# Patient Record
Sex: Female | Born: 1980 | Race: White | Hispanic: No | Marital: Married | Smoking: Current every day smoker
Health system: Southern US, Community
[De-identification: ages and names within clinical notes are randomized; demographics above are authoritative.]

## PROBLEM LIST (undated history)

## (undated) DIAGNOSIS — I10 Essential (primary) hypertension: Secondary | ICD-10-CM

## (undated) DIAGNOSIS — R569 Unspecified convulsions: Secondary | ICD-10-CM

## (undated) HISTORY — PX: TUBAL LIGATION: SHX77

## (undated) HISTORY — PX: TONSILLECTOMY: SUR1361

---

## 2013-08-08 ENCOUNTER — Ambulatory Visit: Payer: Self-pay | Admitting: Physician Assistant

## 2013-08-08 LAB — RAPID STREP-A WITH REFLX: Micro Text Report: NEGATIVE

## 2013-08-11 LAB — BETA STREP CULTURE(ARMC)

## 2015-10-15 ENCOUNTER — Ambulatory Visit
Admission: EM | Admit: 2015-10-15 | Discharge: 2015-10-15 | Disposition: A | Discharge status: Home / Self Care | Payer: PRIVATE HEALTH INSURANCE | Attending: Family Medicine | Admitting: Family Medicine

## 2015-10-15 DIAGNOSIS — B349 Viral infection, unspecified: Secondary | ICD-10-CM | POA: Diagnosis not present

## 2015-10-15 DIAGNOSIS — J988 Other specified respiratory disorders: Principal | ICD-10-CM

## 2015-10-15 DIAGNOSIS — B9789 Other viral agents as the cause of diseases classified elsewhere: Secondary | ICD-10-CM

## 2015-10-15 LAB — RAPID INFLUENZA A&B ANTIGENS
Influenza A (ARMC): NEGATIVE
Influenza B (ARMC): NEGATIVE

## 2015-10-15 NOTE — ED Provider Notes (Signed)
[facility]  ____________________________________________  Time seen: Approximately 9:13 AM  I have reviewed the triage vital signs and the nursing notes.   HISTORY  Chief Complaint Nasal Congestion and Cough   HPI Gabriela White is a 35 y.o. female presents for the complaints of cough and nasal congestion since this past Wednesday. Reports 4-5 days of overall symptoms gradual onset. Patient states that she actually feels like she is getting better but states that she was bringing her son in today and while she was here, she wanted to go ahead and be evaluated.  Patient states that nasal congestion and cough have been improving. When asked what is her biggest complaints at this time, patient states that she has no complaints at this time as she is feeling better. Reports has been taking over-the-counter cough and congestion medications which have helped. Reports yesterday she had a fever of 101 orally. States that was the first time she had any fever. Denies sore throat.  Denies chest pain, shortness of breath, abdominal pain, dysuria, neck pain, back pain, rash, chest pain with deep breath, weakness or dizziness. Reports has continued to eat and drink well.   Last menstrual: Now. Denies chance of pregnancy.   History reviewed. No pertinent past medical history.  There are no active problems to display for this patient.   History reviewed. No pertinent past surgical history.  No current outpatient prescriptions on file.  Allergies Review of patient's allergies indicates no known allergies.  History reviewed. No pertinent family history.  Social History Social History  Substance Use Topics  . Smoking status: Never Smoker   . Smokeless tobacco: None  . Alcohol Use: No    Review of Systems Constitutional: Fever as above. Eyes: No visual changes. ENT: No sore throat. Positive nasal congestion and cough. Cardiovascular: Denies chest pain. Respiratory: Denies  shortness of breath. Gastrointestinal: No abdominal pain.  No nausea, no vomiting.  No diarrhea.  No constipation. Genitourinary: Negative for dysuria. Musculoskeletal: Negative for back pain. Skin: Negative for rash. Neurological: Negative for headaches, focal weakness or numbness.  10-point ROS otherwise negative.  ____________________________________________   PHYSICAL EXAM:  VITAL SIGNS: ED Triage Vitals  Enc Vitals Group     BP 10/15/15 0853 141/96 mmHg     Pulse Rate 10/15/15 0853 111 Recheck 94     Resp 10/15/15 0853 20     Temp 10/15/15 0853 98.4 F (36.9 C)     Temp Source 10/15/15 0853 Oral     SpO2 10/15/15 0853 100 %     Weight 10/15/15 0853 160 lb (72.576 kg)     Height 10/15/15 0853 5\' 3"  (1.6 m)     Head Cir --      Peak Flow --      Pain Score 10/15/15 0855 3     Pain Loc --      Pain Edu? --      Excl. in GC? --   Constitutional: Alert and oriented. Well appearing and in no acute distress. Eyes: Conjunctivae are normal. PERRL. EOMI. Head: Atraumatic. No sinus tenderness to palpation. No swelling. No erythema.  Ears: no erythema, normal TMs bilaterally.   Nose:Nasal congestion with clear rhinorrhea  Mouth/Throat: Mucous membranes are moist. Mild pharyngeal erythema. No tonsillar swelling or exudate.  Neck: No stridor.  No cervical spine tenderness to palpation. Hematological/Lymphatic/Immunilogical: No cervical lymphadenopathy. Cardiovascular: Normal rate, regular rhythm. Grossly normal heart sounds.  Good peripheral circulation. Respiratory: Normal respiratory effort.  No retractions. Lungs  CTAB.No wheezes, rales or rhonchi. Good air movement.  Gastrointestinal: Soft and nontender. Normal Bowel sounds. No CVA tenderness. Musculoskeletal: No lower or upper extremity tenderness nor edema. No cervical, thoracic or lumbar tenderness to palpation. Neurologic:  Normal speech and language. No gross focal neurologic deficits are appreciated. No gait  instability. Skin:  Skin is warm, dry and intact. No rash noted. Psychiatric: Mood and affect are normal. Speech and behavior are normal.  ____________________________________________   LABS (all labs ordered are listed, but only abnormal results are displayed)  Labs Reviewed  RAPID INFLUENZA A&B ANTIGENS ([facility] ONLY)    INITIAL IMPRESSION / ASSESSMENT AND PLAN / ED COURSE  Pertinent labs & imaging results that were available during my care of the patient were reviewed by me and considered in my medical decision making (see chart for details).  Very well-appearing patient. No acute distress. Presents for the complaints of 4-5 days of runny nose, nasal congestion and cough. Patient states her symptoms are improving. Patient's son recently with similar. Patient states that overall this time she feels well. Denies sore throat. Lungs clear throughout. Abdomen soft and nontender. Moist mucous membranes. Will evaluate for influenza. Patient states that she does not want to be evaluated for strep throat as her throat does not hurt. Suspect viral upper respiratory infection.  Influenza negative. Patient denies need for prescription medications. Encourage rest, fluids, over the counter tylenol or ibuprofen as needed. Work note for today given. Discussed follow up with Primary care physician this week. Discussed follow up and return parameters including no resolution or any worsening concerns. Patient verbalized understanding and agreed to plan.   ____________________________________________   FINAL CLINICAL IMPRESSION(S) / ED DIAGNOSES  Final diagnoses:  Viral respiratory illness      Note: This dictation was prepared with Dragon dictation along with smaller phrase technology. Any transcriptional errors that result from this process are unintentional.    Renford Dills, NP 10/15/15 1010

## 2015-10-15 NOTE — ED Notes (Signed)
Patient c/o nasal congestion and cough which started this past Wednesday.

## 2015-10-15 NOTE — Discharge Instructions (Signed)
Take over the counter tylenol or ibuprofen as needed. Rest. Drink plenty of fluids.  ° °Follow up with your primary care physician this week as needed. Return to Urgent care for new or worsening concerns.  ° ° °Viral Infections °A viral infection can be caused by different types of viruses. Most viral infections are not serious and resolve on their own. However, some infections may cause severe symptoms and may lead to further complications. °SYMPTOMS °Viruses can frequently cause: °· Minor sore throat. °· Aches and pains. °· Headaches. °· Runny nose. °· Different types of rashes. °· Watery eyes. °· Tiredness. °· Cough. °· Loss of appetite. °· Gastrointestinal infections, resulting in nausea, vomiting, and diarrhea. °These symptoms do not respond to antibiotics because the infection is not caused by bacteria. However, you might catch a bacterial infection following the viral infection. This is sometimes called a "superinfection." Symptoms of such a bacterial infection may include: °· Worsening sore throat with pus and difficulty swallowing. °· Swollen neck glands. °· Chills and a high or persistent fever. °· Severe headache. °· Tenderness over the sinuses. °· Persistent overall ill feeling (malaise), muscle aches, and tiredness (fatigue). °· Persistent cough. °· Yellow, green, or brown mucus production with coughing. °HOME CARE INSTRUCTIONS  °· Only take over-the-counter or prescription medicines for pain, discomfort, diarrhea, or fever as directed by your caregiver. °· Drink enough water and fluids to keep your urine clear or pale yellow. Sports drinks can provide valuable electrolytes, sugars, and hydration. °· Get plenty of rest and maintain proper nutrition. Soups and broths with crackers or rice are fine. °SEEK IMMEDIATE MEDICAL CARE IF:  °· You have severe headaches, shortness of breath, chest pain, neck pain, or an unusual rash. °· You have uncontrolled vomiting, diarrhea, or you are unable to keep down  fluids. °· You or your child has an oral temperature above 102° F (38.9° C), not controlled by medicine. °· Your baby is older than 3 months with a rectal temperature of 102° F (38.9° C) or higher. °· Your baby is 3 months old or younger with a rectal temperature of 100.4° F (38° C) or higher. °MAKE SURE YOU:  °· Understand these instructions. °· Will watch your condition. °· Will get help right away if you are not doing well or get worse. °  °This information is not intended to replace advice given to you by your health care provider. Make sure you discuss any questions you have with your health care provider. °  °Document Released: 05/07/2005 Document Revised: 10/20/2011 Document Reviewed: 01/03/2015 °Elsevier Interactive Patient Education ©2016 [employer] ° °

## 2017-05-18 ENCOUNTER — Encounter: Payer: Self-pay | Admitting: *Deleted

## 2017-05-18 ENCOUNTER — Ambulatory Visit
Admission: EM | Admit: 2017-05-18 | Discharge: 2017-05-18 | Disposition: A | Discharge status: Home / Self Care | Payer: PRIVATE HEALTH INSURANCE | Attending: Family Medicine | Admitting: Family Medicine

## 2017-05-18 DIAGNOSIS — J0100 Acute maxillary sinusitis, unspecified: Secondary | ICD-10-CM | POA: Diagnosis not present

## 2017-05-18 MED ORDER — AMOXICILLIN-POT CLAVULANATE 875-125 MG PO TABS: 1.0000 | ORAL_TABLET | Freq: Two times a day (BID) | ORAL | 0 refills | Status: DC

## 2017-05-18 NOTE — Discharge Instructions (Signed)
Use the Netty pot daily. 10 minutes later use Nasacort or Flonase in both nostrils for the next 3-4 weeks. Follow up with your primary care or ENT if you continue to have symptoms

## 2017-05-18 NOTE — ED Triage Notes (Signed)
Headache, facial pain, ear pain, onset yesterday. Hx of sinus infections.

## 2017-05-18 NOTE — ED Provider Notes (Signed)
[facility]    CSN: [id number] Arrival date & time: 05/18/17  0272     History   Chief Complaint Chief Complaint  Patient presents with  . Headache  . Otalgia  . Facial Pain    HPI Gabriela White is a 36 y.o. female.   HPI  This is a 36 year old female with a previous history of recurrent sinus infections who started having symptoms of a cold approximately 10 days ago. It seemed to linger and then yesterday she began to feel a headache from her nose on up to the top of her head along with facial pain ear pain nasal drainage of clear to yellow mucus and a mild cough early in the morning and late at night. She denies any fever or chills. She was trying to see her ENT today but was unable to get in and decided to come here.              History reviewed. No pertinent past medical history.  There are no active problems to display for this patient.   History reviewed. No pertinent surgical history.  OB History    No data available       Home Medications    Prior to Admission medications   Medication Sig Start Date End Date Taking? Authorizing Provider  amoxicillin-clavulanate (AUGMENTIN) 875-125 MG tablet Take 1 tablet by mouth every 12 (twelve) hours. 05/18/17   Lutricia Feil, PA-C    Family History History reviewed. No pertinent family history.  Social History Social History  Substance Use Topics  . Smoking status: Never Smoker  . Smokeless tobacco: Never Used  . Alcohol use No     Allergies   Patient has no known allergies.   Review of Systems Review of Systems  Constitutional: Positive for activity change. Negative for chills, diaphoresis, fatigue and fever.  HENT: Positive for congestion, ear pain, postnasal drip, sinus pain and sinus pressure.   Respiratory: Positive for cough.   All other systems reviewed and are negative.    Physical Exam Triage Vital Signs ED Triage Vitals  Enc Vitals Group     BP 05/18/17 0846  (!) 160/95     Pulse Rate 05/18/17 0846 (!) 106     Resp 05/18/17 0846 16     Temp 05/18/17 0846 98.2 F (36.8 C)     Temp Source 05/18/17 0846 Oral     SpO2 05/18/17 0846 100 %     Weight 05/18/17 0848 150 lb (68 kg)     Height 05/18/17 0848  (1.6 m)     Head Circumference --      Peak Flow --      Pain Score 05/18/17 0849 8     Pain Loc --      Pain Edu? --      Excl. in GC? --    No data found.   Updated Vital Signs BP (!) 160/95 (BP Location: Left Arm)   Pulse (!) 106   Temp 98.2 F (36.8 C) (Oral)   Resp 16   Ht  (1.6 m)   Wt 150 lb (68 kg)   SpO2 100%   BMI 26.57 kg/m   Visual Acuity Right Eye Distance:   Left Eye Distance:   Bilateral Distance:    Right Eye Near:   Left Eye Near:    Bilateral Near:     Physical Exam  Constitutional: She is oriented to person, place, and time. She appears  well-developed and well-nourished. No distress.  HENT:  Head: Normocephalic.  Right Ear: External ear normal.  Left Ear: External ear normal.  Nose: Nose normal.  Mouth/Throat: Oropharynx is clear and moist. No oropharyngeal exudate.  Eyes: Pupils are equal, round, and reactive to light. Right eye exhibits no discharge. Left eye exhibits no discharge.  Neck: Normal range of motion.  Pulmonary/Chest: Effort normal and breath sounds normal.  Musculoskeletal: Normal range of motion.  Lymphadenopathy:    She has no cervical adenopathy.  Neurological: She is alert and oriented to person, place, and time.  Skin: Skin is warm and dry. She is not diaphoretic.  Psychiatric: She has a normal mood and affect. Her behavior is normal. Judgment and thought content normal.  Nursing note and vitals reviewed.    UC Treatments / Results  Labs (all labs ordered are listed, but only abnormal results are displayed) Labs Reviewed - No data to display  EKG  EKG Interpretation None       Radiology No results found.  Procedures Procedures (including critical care  time)  Medications Ordered in UC Medications - No data to display   Initial Impression / Assessment and Plan / UC Course  I have reviewed the triage vital signs and the nursing notes.  Pertinent labs & imaging results that were available during my care of the patient were reviewed by me and considered in my medical decision making (see chart for details).     Plan: 1. Test/x-ray results and diagnosis reviewed with patient 2. rx as per orders; risks, benefits, potential side effects reviewed with patient 3. Recommend supportive treatment with use of a Nettie pot on a daily basis. Follow with nasal spray of either Nasacort or Flonase continue treatment for 3 weeks. We'll start on Augmentin since she has had this approximately 10 days at this point time with tenderness over the maxillary sinuses and a history of recurrent sinusitis in the past.If she  is not improving ,I recommend that she follow-up with her primary care physician. 4. F/u prn if symptoms worsen or don't improve   Final Clinical Impressions(s) / UC Diagnoses   Final diagnoses:  Acute maxillary sinusitis, recurrence not specified    New Prescriptions Discharge Medication List as of 05/18/2017  9:43 AM    START taking these medications   Details  amoxicillin-clavulanate (AUGMENTIN) 875-125 MG tablet Take 1 tablet by mouth every 12 (twelve) hours., Starting Mon 05/18/2017, Normal         Controlled Substance Prescriptions [place] Controlled Substance Registry consulted? Not Applicable   Lutricia Feil, PA-C 05/18/17 4098

## 2017-12-21 ENCOUNTER — Other Ambulatory Visit: Payer: Self-pay

## 2017-12-21 ENCOUNTER — Ambulatory Visit
Admission: EM | Admit: 2017-12-21 | Discharge: 2017-12-21 | Disposition: A | Discharge status: Home / Self Care | Payer: PRIVATE HEALTH INSURANCE | Attending: Family Medicine | Admitting: Family Medicine

## 2017-12-21 DIAGNOSIS — E871 Hypo-osmolality and hyponatremia: Secondary | ICD-10-CM | POA: Insufficient documentation

## 2017-12-21 DIAGNOSIS — R002 Palpitations: Secondary | ICD-10-CM | POA: Diagnosis not present

## 2017-12-21 DIAGNOSIS — E860 Dehydration: Secondary | ICD-10-CM | POA: Diagnosis not present

## 2017-12-21 LAB — CBC WITH DIFFERENTIAL/PLATELET
BASOS ABS: 0.1 10*3/uL (ref 0–0.1)
BASOS PCT: 1 %
Eosinophils Absolute: 0 10*3/uL (ref 0–0.7)
Eosinophils Relative: 0 %
HEMATOCRIT: 39 % (ref 35.0–47.0)
HEMOGLOBIN: 13.6 g/dL (ref 12.0–16.0)
Lymphocytes Relative: 14 %
Lymphs Abs: 1.6 10*3/uL (ref 1.0–3.6)
MCH: 32.5 pg (ref 26.0–34.0)
MCHC: 34.8 g/dL (ref 32.0–36.0)
MCV: 93.5 fL (ref 80.0–100.0)
Monocytes Absolute: 0.8 10*3/uL (ref 0.2–0.9)
Monocytes Relative: 7 %
NEUTROS ABS: 8.9 10*3/uL — AB (ref 1.4–6.5)
NEUTROS PCT: 78 %
Platelets: 213 10*3/uL (ref 150–440)
RBC: 4.17 MIL/uL (ref 3.80–5.20)
RDW: 13.6 % (ref 11.5–14.5)
WBC: 11.4 10*3/uL — ABNORMAL HIGH (ref 3.6–11.0)

## 2017-12-21 LAB — COMPREHENSIVE METABOLIC PANEL
ALBUMIN: 4.8 g/dL (ref 3.5–5.0)
ALK PHOS: 44 U/L (ref 38–126)
ALT: 27 U/L (ref 14–54)
ANION GAP: 13 (ref 5–15)
AST: 41 U/L (ref 15–41)
BILIRUBIN TOTAL: 0.8 mg/dL (ref 0.3–1.2)
BUN: 5 mg/dL — AB (ref 6–20)
CO2: 21 mmol/L — AB (ref 22–32)
Calcium: 10.1 mg/dL (ref 8.9–10.3)
Chloride: 97 mmol/L — ABNORMAL LOW (ref 101–111)
Creatinine, Ser: 0.5 mg/dL (ref 0.44–1.00)
GFR calc Af Amer: >60 mL/min (ref >60)
GFR calc non Af Amer: >60 mL/min (ref >60)
GLUCOSE: 107 mg/dL — AB (ref 65–99)
POTASSIUM: 4 mmol/L (ref 3.5–5.1)
Sodium: 131 mmol/L — ABNORMAL LOW (ref 135–145)
TOTAL PROTEIN: 8.2 g/dL — AB (ref 6.5–8.1)

## 2017-12-21 LAB — MAGNESIUM: Magnesium: 1.7 mg/dL (ref 1.7–2.4)

## 2017-12-21 LAB — GLUCOSE, CAPILLARY: Glucose-Capillary: 94 mg/dL (ref 65–99)

## 2017-12-21 MED ORDER — SODIUM CHLORIDE 0.9 % IV SOLN
Freq: Once | INTRAVENOUS | Status: AC
  Administered 2017-12-21: 14:00:00 via INTRAVENOUS

## 2017-12-21 NOTE — Discharge Instructions (Addendum)
Continue with  increase gatorade and water intake

## 2017-12-21 NOTE — ED Triage Notes (Addendum)
Pt with palpitations starting this a.m. And feeling shaky. Denies pain. Endorses drinking 3 liquor drinks last night

## 2017-12-21 NOTE — ED Provider Notes (Signed)
[facility]    CSN: [id number] Arrival date & time: 12/21/17  1250     History   Chief Complaint Chief Complaint  Patient presents with  . Palpitations    HPI Gabriela White is a 37 y.o. female.   The history is provided by the patient.  Palpitations  Palpitations quality:  Fast Onset quality:  Sudden Duration:  6 hours Timing:  Constant Progression:  Unchanged Chronicity:  New Context: caffeine   Context comment:  Alcohol last night (3 vodka drinks); states did not drink enough water Relieved by:  None tried Ineffective treatments:  None tried Associated symptoms: no back pain, no chest pain, no chest pressure, no cough, no diaphoresis, no dizziness, no hemoptysis, no leg pain, no lower extremity edema, no malaise/fatigue, no nausea, no near-syncope, no numbness, no orthopnea, no PND, no shortness of breath, no syncope, no vomiting and no weakness   Risk factors: no diabetes mellitus, no heart disease, no hx of atrial fibrillation, no hx of DVT, no hx of PE, no hx of thyroid disease, no hypercoagulable state, no hyperthyroidism, no OTC sinus medications and no stress     History reviewed. No pertinent past medical history.  There are no active problems to display for this patient.   History reviewed. No pertinent surgical history.  OB History   None      Home Medications    Prior to Admission medications   Medication Sig Start Date End Date Taking? Authorizing Provider  amoxicillin-clavulanate (AUGMENTIN) 875-125 MG tablet Take 1 tablet by mouth every 12 (twelve) hours. 05/18/17   Lutricia Feil, PA-C    Family History History reviewed. No pertinent family history.  Social History Social History   Tobacco Use  . Smoking status: Never Smoker  . Smokeless tobacco: Never Used  Substance Use Topics  . Alcohol use: Yes    Comment: social  . Drug use: No     Allergies   Patient has no known allergies.   Review of Systems Review  of Systems  Constitutional: Negative for diaphoresis and malaise/fatigue.  Respiratory: Negative for cough, hemoptysis and shortness of breath.   Cardiovascular: Positive for palpitations. Negative for chest pain, orthopnea, syncope, PND and near-syncope.  Gastrointestinal: Negative for nausea and vomiting.  Musculoskeletal: Negative for back pain.  Neurological: Negative for dizziness, weakness and numbness.     Physical Exam Triage Vital Signs ED Triage Vitals  Enc Vitals Group     BP 12/21/17 1301 (!) 163/101     Pulse Rate 12/21/17 1301 (!) 121     Resp 12/21/17 1301 20     Temp 12/21/17 1301 98.5 F (36.9 C)     Temp Source 12/21/17 1301 Oral     SpO2 12/21/17 1301 100 %     Weight 12/21/17 1258 150 lb (68 kg)     Height 12/21/17 1258  (1.6 m)     Head Circumference --      Peak Flow --      Pain Score 12/21/17 1258 0     Pain Loc --      Pain Edu? --      Excl. in GC? --    No data found.  Updated Vital Signs BP (!) 160/96   Pulse (!) 102   Temp 98.3 F (36.8 C) (Oral)   Resp 18   Ht  (1.6 m)   Wt 150 lb (68 kg)   LMP 11/28/2017   SpO2 100%  BMI 26.57 kg/m   Visual Acuity Right Eye Distance:   Left Eye Distance:   Bilateral Distance:    Right Eye Near:   Left Eye Near:    Bilateral Near:     Physical Exam  Constitutional: She is oriented to person, place, and time. She appears well-developed and well-nourished. No distress.  HENT:  Head: Normocephalic.  Right Ear: Tympanic membrane, external ear and ear canal normal.  Left Ear: Tympanic membrane, external ear and ear canal normal.  Nose: Nose normal.  Mouth/Throat: Oropharynx is clear and moist and mucous membranes are normal. No tonsillar exudate.  Neck: Normal range of motion. Neck supple. No JVD present. No tracheal deviation present. No thyromegaly present.  Cardiovascular: Regular rhythm, normal heart sounds and intact distal pulses. Tachycardia present.  No murmur  heard. Pulmonary/Chest: Effort normal and breath sounds normal. No stridor. No respiratory distress. She has no wheezes. She has no rales. She exhibits no tenderness.  Abdominal: Soft. Bowel sounds are normal. She exhibits no distension and no mass. There is no tenderness. There is no rebound and no guarding.  Musculoskeletal: She exhibits no edema or tenderness.  Lymphadenopathy:    She has no cervical adenopathy.  Neurological: She is alert and oriented to person, place, and time. She has normal reflexes.  Skin: Skin is warm and dry. No rash noted. She is not diaphoretic. No erythema. No pallor.  Psychiatric: She has a normal mood and affect. Her behavior is normal. Thought content normal.  Nursing note and vitals reviewed.    UC Treatments / Results  Labs (all labs ordered are listed, but only abnormal results are displayed) Labs Reviewed  CBC WITH DIFFERENTIAL/PLATELET - Abnormal; Notable for the following components:      Result Value   WBC 11.4 (*)    Neutro Abs 8.9 (*)    All other components within normal limits  COMPREHENSIVE METABOLIC PANEL - Abnormal; Notable for the following components:   Sodium 131 (*)    Chloride 97 (*)    CO2 21 (*)    Glucose, Bld 107 (*)    BUN 5 (*)    Total Protein 8.2 (*)    All other components within normal limits  MAGNESIUM  GLUCOSE, CAPILLARY  CBG MONITORING, ED    EKG None  Radiology No results found.  Procedures ED EKG Date/Time: 12/21/2017 2:28 PM Performed by: Payton Mccallum, MD Authorized by: Payton Mccallum, MD   ECG reviewed by ED Physician in the absence of a cardiologist: yes   Previous ECG:    Previous ECG:  Unavailable Interpretation:    Interpretation: abnormal   Rate:    ECG rate:  116   ECG rate assessment: tachycardic   Rhythm:    Rhythm: sinus tachycardia   Ectopy:    Ectopy: none   QRS:    QRS axis:  Normal Conduction:    Conduction: normal   ST segments:    ST segments:  Normal T waves:    T  waves: normal     (including critical care time)  Medications Ordered in UC Medications  0.9 %  sodium chloride infusion ( Intravenous Stopped 12/21/17 1407)    Initial Impression / Assessment and Plan / UC Course  I have reviewed the triage vital signs and the nursing notes.  Pertinent labs & imaging results that were available during my care of the patient were reviewed by me and considered in my medical decision making (see chart for details).  Final Clinical Impressions(s) / UC Diagnoses   Final diagnoses:  Hyponatremia  Dehydration     Discharge Instructions     Continue with  increase gatorade and water intake    ED Prescriptions    None      1. Labs/ekg results and diagnosis reviewed with patient 2. Given 1 L of NS with improvement of symptoms 3. Recommend supportive treatment with increased water and gatorade; recommend alcohol cessation  4. Follow-up prn if symptoms worsen or don't improve  Controlled Substance Prescriptions [place] Controlled Substance Registry consulted? Not Applicable   Payton Mccallum, MD 12/21/17 [phone]

## 2019-05-20 LAB — LIPID PANEL
Cholesterol: 193 (ref 0–200)
HDL: 117 — AB (ref 35–70)
LDL Cholesterol: 70
Triglycerides: 32 — AB (ref 40–160)

## 2020-08-10 ENCOUNTER — Encounter: Payer: Self-pay | Admitting: Emergency Medicine

## 2020-08-10 ENCOUNTER — Emergency Department
Admission: EM | Admit: 2020-08-10 | Discharge: 2020-08-10 | Disposition: A | Discharge status: Home / Self Care | Payer: No Typology Code available for payment source | Attending: Emergency Medicine | Admitting: Emergency Medicine

## 2020-08-10 ENCOUNTER — Other Ambulatory Visit: Payer: Self-pay

## 2020-08-10 DIAGNOSIS — R112 Nausea with vomiting, unspecified: Secondary | ICD-10-CM | POA: Insufficient documentation

## 2020-08-10 DIAGNOSIS — Z5321 Procedure and treatment not carried out due to patient leaving prior to being seen by health care provider: Secondary | ICD-10-CM | POA: Diagnosis not present

## 2020-08-10 DIAGNOSIS — T7840XA Allergy, unspecified, initial encounter: Secondary | ICD-10-CM | POA: Insufficient documentation

## 2020-08-10 HISTORY — DX: Essential (primary) hypertension: I10

## 2020-08-10 LAB — URINALYSIS, COMPLETE (UACMP) WITH MICROSCOPIC
Bacteria, UA: NONE SEEN
Bilirubin Urine: NEGATIVE
Glucose, UA: NEGATIVE mg/dL
Hgb urine dipstick: NEGATIVE
Ketones, ur: NEGATIVE mg/dL
Leukocytes,Ua: NEGATIVE
Nitrite: POSITIVE — AB
Protein, ur: 100 mg/dL — AB
Specific Gravity, Urine: 1.008 (ref 1.005–1.030)
pH: 9 — ABNORMAL HIGH (ref 5.0–8.0)

## 2020-08-10 LAB — CBC
HCT: 33 % — ABNORMAL LOW (ref 36.0–46.0)
Hemoglobin: 11.5 g/dL — ABNORMAL LOW (ref 12.0–15.0)
MCH: 32.3 pg (ref 26.0–34.0)
MCHC: 34.8 g/dL (ref 30.0–36.0)
MCV: 92.7 fL (ref 80.0–100.0)
Platelets: 144 10*3/uL — ABNORMAL LOW (ref 150–400)
RBC: 3.56 MIL/uL — ABNORMAL LOW (ref 3.87–5.11)
RDW: 12.9 % (ref 11.5–15.5)
WBC: 9.6 10*3/uL (ref 4.0–10.5)
nRBC: 0 % (ref 0.0–0.2)

## 2020-08-10 LAB — COMPREHENSIVE METABOLIC PANEL
ALT: 73 U/L — ABNORMAL HIGH (ref 0–44)
AST: 82 U/L — ABNORMAL HIGH (ref 15–41)
Albumin: 4.7 g/dL (ref 3.5–5.0)
Alkaline Phosphatase: 57 U/L (ref 38–126)
Anion gap: 12 (ref 5–15)
BUN: <5 mg/dL — ABNORMAL LOW (ref 6–20)
CO2: 26 mmol/L (ref 22–32)
Calcium: 9.4 mg/dL (ref 8.9–10.3)
Chloride: 87 mmol/L — ABNORMAL LOW (ref 98–111)
Creatinine, Ser: 0.49 mg/dL (ref 0.44–1.00)
GFR, Estimated: >60 mL/min (ref >60)
Glucose, Bld: 106 mg/dL — ABNORMAL HIGH (ref 70–99)
Potassium: 3.8 mmol/L (ref 3.5–5.1)
Sodium: 125 mmol/L — ABNORMAL LOW (ref 135–145)
Total Bilirubin: 1.7 mg/dL — ABNORMAL HIGH (ref 0.3–1.2)
Total Protein: 7.2 g/dL (ref 6.5–8.1)

## 2020-08-10 LAB — LIPASE, BLOOD: Lipase: 24 U/L (ref 11–51)

## 2020-08-10 LAB — POC URINE PREG, ED: Preg Test, Ur: NEGATIVE

## 2020-08-10 MED ORDER — ONDANSETRON 4 MG PO TBDP
4.0000 mg | ORAL_TABLET | Freq: Once | ORAL | Status: AC | PRN
  Administered 2020-08-10: 4 mg via ORAL
  Filled 2020-08-10: qty 1

## 2020-08-10 NOTE — ED Triage Notes (Signed)
Pt to ED via EMS from home c/o n/v with >10 episodes vomiting since last night.  States being treated for UTI and started bactrim on Monday but now noticing like cheeks and face are swelling.  Denies SOB, trouble swallowing or tongue or lip involvement.  Pt chest rise even and unlabored, skin WNL, speaking in complete and coherent sentences, in NAD at this time.

## 2020-08-10 NOTE — ED Triage Notes (Signed)
EMS brought pt in from home for c/o "allergic reaction"; rx bactrim on Monday for UTI and [facility]; pt c/o N/V

## 2020-09-24 LAB — HEPATIC FUNCTION PANEL
ALT: 82 — AB (ref 7–35)
AST: 121 — AB (ref 13–35)
Alkaline Phosphatase: 78 (ref 25–125)
Bilirubin, Total: 0.8

## 2020-09-24 LAB — COMPREHENSIVE METABOLIC PANEL
Albumin: 4.6 (ref 3.5–5.0)
Calcium: 9.6 (ref 8.7–10.7)
GFR calc non Af Amer: 90

## 2020-09-24 LAB — BASIC METABOLIC PANEL
BUN: 5 (ref 4–21)
CO2: 25 — AB (ref 13–22)
Chloride: 95 — AB (ref 99–108)
Creatinine: 0.5 (ref 0.5–1.1)
Glucose: 80
Potassium: 3.8 (ref 3.4–5.3)
Sodium: 130 — AB (ref 137–147)

## 2020-12-11 ENCOUNTER — Ambulatory Visit
Admission: EM | Admit: 2020-12-11 | Discharge: 2020-12-11 | Disposition: A | Discharge status: Home / Self Care | Payer: No Typology Code available for payment source | Attending: Sports Medicine | Admitting: Sports Medicine

## 2020-12-11 ENCOUNTER — Other Ambulatory Visit: Payer: Self-pay

## 2020-12-11 DIAGNOSIS — R112 Nausea with vomiting, unspecified: Secondary | ICD-10-CM

## 2020-12-11 DIAGNOSIS — R1031 Right lower quadrant pain: Secondary | ICD-10-CM | POA: Diagnosis present

## 2020-12-11 DIAGNOSIS — R630 Anorexia: Secondary | ICD-10-CM | POA: Insufficient documentation

## 2020-12-11 LAB — URINALYSIS, COMPLETE (UACMP) WITH MICROSCOPIC
Bacteria, UA: NONE SEEN
Bilirubin Urine: NEGATIVE
Glucose, UA: NEGATIVE mg/dL
Hgb urine dipstick: NEGATIVE
Ketones, ur: NEGATIVE mg/dL
Leukocytes,Ua: NEGATIVE
Nitrite: NEGATIVE
Protein, ur: NEGATIVE mg/dL
Specific Gravity, Urine: 1.01 (ref 1.005–1.030)
pH: 7 (ref 5.0–8.0)

## 2020-12-11 MED ORDER — ONDANSETRON HCL 4 MG PO TABS: 4.0000 mg | ORAL_TABLET | Freq: Four times a day (QID) | ORAL | 0 refills | Status: DC

## 2020-12-11 NOTE — ED Triage Notes (Signed)
Pt c/o abdominal pain on the right side for the last 5 months, yesterday vomited from 3pm-8pm.

## 2020-12-11 NOTE — ED Provider Notes (Signed)
[facility]    CSN: [id number] Arrival date & time: 12/11/20  1454      History   Chief Complaint Chief Complaint  Patient presents with  . Abdominal Pain    HPI Gabriela White is a 40 y.o. female.   Patient is a pleasant 40 year old female who presents for evaluation of the above issue.  I reviewed her chart in detail and I have included a synopsis of the care that she has had since Christmas of 2021.  Seems as though it is all related to similar symptoms.  Patient reports that she has had intermittent occasional right lower quadrant pain since Christmas of 2021.  She has been seen on multiple occasions and has had labs done, CT scans, and partial treatment.  It appears as though she has had a primary care provider with the [facility] system but she is recently changed over to the [facility] system and has a initial consultation with her primary care provider on May 20, so in 17 days.  She works as an Glass blower/designer for [employer], [employer].  Her symptoms worsen to the point yesterday where she was vomiting a lot and had to leave work.  The dental surgeon prescribed Zofran.  She took a few yesterday.  She has not vomited today.  She has had some intermittent nausea.  No fever shakes chills.  No diarrhea.  She has had decreased appetite.  She denies any dysuria, hematuria, increased frequency, increased urgency.  Her last menstrual period was several months ago.  She reports that she has not had any sexual intercourse since her last menstrual period, but despite that she took a pregnancy test last week and she reports that it was negative.  She does not want a pregnancy test done today secondary to the cost.  She says she is not eating or drinking a lot but she is making adequate amount of urine.  She has never been referred to gastroenterology.  She denies any chest pain or shortness of breath.  A summary of her course over the past 5 months is included:  Patient went to the emergency  room on 08/06/2020 and was diagnosed with pyelonephritis and put on Bactrim for 10 days. Urine culture grew out Klebsiella. 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 She returned back to the emergency department on 12/31 for multiple episodes of vomiting resulting in facial swelling. They gave her Zofran which help with the nausea and started her on Augmentin instead. On 08/21/2020 patient went to the emergency room at [facility] complaining of ongoing right flank pain. She had some labs done including a urine that showed no nitrites or leuks but did have 3+ blood. Patient states she was on her menstrual cycle at that time.  Her CT renal stone study showed IMPRESSION:  1. No renal stones or hydronephrosis.  2. No evidence to suggest acute appendicitis.  3. Hepatic steatosis.  4. Probable right adnexal cyst for which six-week follow-up ultrasound is  recommended.   At all of her visits patient's LFTs were elevated.  Elevated LFTs  12/27 AST: 119 ALT: 102 Alk Phos: 69  12/31 AST 82 ALT 73 Alk hos: 57  08/21/2020 AST 176 ALT 131 Alk Phos 62  Patient states the pain is still coming and going. She will "have 3 good hours and then feels really bad again". She describes the pain as a pulsating in her right lower quadrant at times.  She denies any symptoms of urinary frequency, hesitancy, dysuria, hematuria. Patient does not like to take pain medications. She has taken ibuprofen and Tylenol, which helped a little bit. She is having normal bowel movements for her, which are usually every day to every other day. Patient does have her appendix. She denies any mucus in her stool, melena, or hematochezia. Patient denies any vaginal discharge. Patient has not noticed any change in her pain with eating. She did start vomiting yesterday but that has resolved. No hemetemasis. No fever, chills.  She does have a history of GERD and takes omeprazole.  She also  reports that she has been told that she has a ulcer, but she has not had a endoscopy or colonoscopy to confirm any GI diagnoses. No blood in the stool.   Upon arrival, patient is clinically stable and vital signs are within normal limits except for mildly elevated blood pressure.  Patient admits that she is only here today because she is required to have follow-up because her boss sent her home from work yesterday and she needs a note to get back to work tomorrow. No red flag signs or symptoms appreciated on history.      Past Medical History:  Diagnosis Date  . Hypertension     There are no problems to display for this patient.   Past Surgical History:  Procedure Laterality Date  . TONSILLECTOMY    . TUBAL LIGATION      OB History   No obstetric history on file.      Home Medications    Prior to Admission medications   Medication Sig Start Date End Date Taking? Authorizing Provider  buPROPion (WELLBUTRIN XL) 150 MG 24 hr tablet Take 3 tablets by mouth daily. 10/26/20  Yes [provider]  losartan (COZAAR) 50 MG tablet Take 1 tablet by mouth daily. 10/26/20  Yes [provider]  ondansetron (ZOFRAN) 4 MG tablet Take 1 tablet (4 mg total) by mouth every 6 (six) hours. 12/11/20  Yes Verda Cumins, MD  amoxicillin-clavulanate (AUGMENTIN) 875-125 MG tablet Take 1 tablet by mouth every 12 (twelve) hours. 05/18/17   Lorin Picket, PA-C    Family History No family history on file.  Social History Social History   Tobacco Use  . Smoking status: Current Every Day Smoker    Packs/day: 0.75    Types: Cigarettes  . Smokeless tobacco: Never Used  Vaping Use  . Vaping Use: Never used  Substance Use Topics  . Alcohol use: Yes    Comment: couple mixed drinks per night  . Drug use: No     Allergies   Morphine   Review of Systems Review of Systems  Constitutional: Positive for appetite change. Negative for activity change, chills, diaphoresis, fatigue  and fever.  HENT: Negative.  Negative for congestion, ear pain, sinus pain and sore throat.   Eyes: Negative.  Negative for pain.  Respiratory: Negative.  Negative for chest tightness, shortness of breath and wheezing.   Cardiovascular: Negative.  Negative for chest pain and palpitations.  Gastrointestinal: Positive for abdominal pain, nausea and vomiting. Negative for abdominal distention, blood in stool, constipation and diarrhea.  Genitourinary: Negative.  Negative for dysuria, flank pain, frequency, hematuria, pelvic pain, urgency, vaginal bleeding and vaginal pain.  Musculoskeletal: Negative.  Negative for arthralgias, back pain, myalgias, neck pain and neck stiffness.  Skin: Negative.  Negative for color change, pallor, rash and wound.  Neurological: Negative for dizziness, syncope, weakness, light-headedness and headaches.  Hematological: Negative.  Negative for adenopathy. Does not bruise/bleed easily.  All other systems reviewed and are negative.    Physical Exam Triage Vital Signs ED Triage Vitals  Enc Vitals Group     BP 12/11/20 1501 (!) 161/92     Pulse Rate 12/11/20 1501 94     Resp 12/11/20 1501 16     Temp 12/11/20 1501 98.4 F (36.9 C)     Temp Source 12/11/20 1501 Oral     SpO2 12/11/20 1501 100 %     Weight 12/11/20 1501 135 lb (61.2 kg)     Height 12/11/20 1501 5' 3"  (1.6 m)     Head Circumference --      Peak Flow --      Pain Score 12/11/20 1500 0     Pain Loc --      Pain Edu? --      Excl. in [place]? --    No data found.  Updated Vital Signs BP (!) 161/92 (BP Location: Left Arm)   Pulse 94   Temp 98.4 F (36.9 C) (Oral)   Resp 16   Ht 5' 3"  (1.6 m)   Wt 61.2 kg   LMP  (LMP Unknown)   SpO2 100%   BMI 23.91 kg/m   Visual Acuity Right Eye Distance:   Left Eye Distance:   Bilateral Distance:    Right Eye Near:   Left Eye Near:    Bilateral Near:     Physical Exam Vitals and nursing note reviewed.  Constitutional:      General: She is not  in acute distress.    Appearance: She is well-developed. She is not ill-appearing, toxic-appearing or diaphoretic.  HENT:     Head: Normocephalic and atraumatic.     Nose: Nose normal. No congestion or rhinorrhea.     Mouth/Throat:     Mouth: Mucous membranes are moist.     Pharynx: No pharyngeal swelling or oropharyngeal exudate.  Eyes:     General: No scleral icterus.    Extraocular Movements: Extraocular movements intact.     Pupils: Pupils are equal, round, and reactive to light.  Cardiovascular:     Rate and Rhythm: Normal rate and regular rhythm.     Heart sounds: Normal heart sounds. No murmur heard. No friction rub. No gallop.   Pulmonary:     Effort: Pulmonary effort is normal. No respiratory distress.     Breath sounds: Normal breath sounds. No stridor. No wheezing, rhonchi or rales.  Abdominal:     General: Abdomen is flat. Bowel sounds are normal. There is no distension. There are no signs of injury.     Palpations: Abdomen is soft. There is no shifting dullness, fluid wave, hepatomegaly, splenomegaly or mass.     Tenderness: There is abdominal tenderness in the right lower quadrant. There is no right CVA tenderness, left CVA tenderness, guarding or rebound. Negative signs include Murphy's sign, Rovsing's sign, McBurney's sign, psoas sign and obturator sign.  Musculoskeletal:     Cervical back: Normal range of motion and neck supple. No rigidity or tenderness.  Lymphadenopathy:     Cervical: No cervical adenopathy.  Skin:    General: Skin is warm and dry.     Capillary Refill: Capillary refill takes less than 2 seconds.     Coloration: Skin is not cyanotic or pale.     Findings: No erythema or rash.  Neurological:     General: No focal deficit present.  Mental Status: She is alert and oriented to person, place, and time.  Psychiatric:        Mood and Affect: Mood is anxious.      UC Treatments / Results  Labs (all labs ordered are listed, but only abnormal  results are displayed) Labs Reviewed  URINALYSIS, COMPLETE (UACMP) WITH MICROSCOPIC - Abnormal; Notable for the following components:      Result Value   Color, Urine STRAW (*)    All other components within normal limits    EKG   Radiology No results found.  Procedures Procedures (including critical care time)  Medications Ordered in UC Medications - No data to display  Initial Impression / Assessment and Plan / UC Course  I have reviewed the triage vital signs and the nursing notes.  Pertinent labs & imaging results that were available during my care of the patient were reviewed by me and considered in my medical decision making (see chart for details).  Clinical impression: 40 year old female with 5 months of intermittent right lower abdominal pain with an acute flare yesterday with nausea and vomiting.  She has not vomited since yesterday.  Extensive chart review and history obtained and is above.  Her exam and vital signs are very reassuring.  Treatment plan: 1.  The findings and treatment plan were discussed in detail with the patient.  Patient was in agreement. 2.  We had a long discussion regarding the fact that I could certainly repeat her labs and do more imaging but I did not feel as though that was clinically indicated today.  She was in agreement with this. 3.  I have encouraged her to establish care with her primary care provider and see if they can work together to get to the bottom of why she is having the symptoms for almost 6 months now. 4.  She may need a GI referral and possible endoscopy and colonoscopy to address her GI symptoms. 5.  I went ahead and gave her another prescription for Zofran. 6.  I also gave her a work note saying that she was seen today and she can go back to work tomorrow. 7.  Certainly if her symptoms were to worsen outside of leaving the urgent care today then she should go to the ER and be evaluated when she is acutely symptomatic.  She  agreed with this. 8.  She was stable on discharge and she will follow-up here as needed.    Final Clinical Impressions(s) / UC Diagnoses   Final diagnoses:  Right lower quadrant abdominal pain  Non-intractable vomiting with nausea, unspecified vomiting type  Decreased appetite     Discharge Instructions     As we discussed, your vital signs are unremarkable except for a mild elevation in your blood pressure.  Your physical exam findings are also within normal limits with no evidence of an acute abdomen.  Your urinalysis does not show a urinary tract infection.  Per history, he report no possibility of pregnancy with a negative pregnancy test last week.  In addition the report no discharge or concern for an STD. You do need to establish care with a new primary care provider and it is my understanding that that will happen over the next 2 weeks.  I would recommend that you get referred to GI to consider a further work-up to include an endoscopy and a colonoscopy. If your symptoms were to worsen in any way please go to the emergency room.    ED Prescriptions  Medication Sig Dispense Auth. Provider   ondansetron (ZOFRAN) 4 MG tablet Take 1 tablet (4 mg total) by mouth every 6 (six) hours. 12 tablet Verda Cumins, MD     PDMP not reviewed this encounter.   Verda Cumins, MD 12/14/20 [phone]

## 2020-12-11 NOTE — Discharge Instructions (Signed)
As we discussed, your vital signs are unremarkable except for a mild elevation in your blood pressure.  Your physical exam findings are also within normal limits with no evidence of an acute abdomen.  Your urinalysis does not show a urinary tract infection.  Per history, he report no possibility of pregnancy with a negative pregnancy test last week.  In addition the report no discharge or concern for an STD. You do need to establish care with a new primary care provider and it is my understanding that that will happen over the next 2 weeks.  I would recommend that you get referred to GI to consider a further work-up to include an endoscopy and a colonoscopy. If your symptoms were to worsen in any way please go to the emergency room.

## 2020-12-11 NOTE — ED Triage Notes (Signed)
Patient c/o intermittent RLQ pain that started 5 months ago. She also reports vomiting 3-4 times per week that has been going on for 5 months. She denies urinary symptoms, denies vaginal discharge. Denies concerns for STDS, states she has not been sexually active in several months.

## 2020-12-28 ENCOUNTER — Other Ambulatory Visit: Payer: Self-pay

## 2020-12-28 ENCOUNTER — Ambulatory Visit (INDEPENDENT_AMBULATORY_CARE_PROVIDER_SITE_OTHER): Payer: No Typology Code available for payment source | Admitting: Internal Medicine

## 2020-12-28 ENCOUNTER — Encounter: Payer: Self-pay | Admitting: Internal Medicine

## 2020-12-28 VITALS — BP 138/88 | HR 92 | Ht 63.0 in | Wt 133.2 lb

## 2020-12-28 DIAGNOSIS — K219 Gastro-esophageal reflux disease without esophagitis: Secondary | ICD-10-CM | POA: Diagnosis not present

## 2020-12-28 DIAGNOSIS — R7989 Other specified abnormal findings of blood chemistry: Secondary | ICD-10-CM

## 2020-12-28 DIAGNOSIS — N912 Amenorrhea, unspecified: Secondary | ICD-10-CM

## 2020-12-28 DIAGNOSIS — R1111 Vomiting without nausea: Secondary | ICD-10-CM

## 2020-12-28 DIAGNOSIS — I10 Essential (primary) hypertension: Secondary | ICD-10-CM | POA: Insufficient documentation

## 2020-12-28 DIAGNOSIS — R102 Pelvic and perineal pain: Secondary | ICD-10-CM | POA: Diagnosis not present

## 2020-12-28 DIAGNOSIS — F17200 Nicotine dependence, unspecified, uncomplicated: Secondary | ICD-10-CM | POA: Insufficient documentation

## 2020-12-28 NOTE — Progress Notes (Signed)
Date:  12/28/2020   Name:  Gabriela White   DOB:  [birth year]   MRN:  [id number]   Chief Complaint: Establish Care, Hypertension, and Vomiting (Most days - affecting her work and home life.  Been happening at least 3 months. Wants to see a GI doctor. )  Hypertension This is a chronic problem. The problem is controlled. Pertinent negatives include no chest pain, headaches, palpitations or shortness of breath. Past treatments include angiotensin blockers. The current treatment provides significant improvement.  Depression        This is a chronic problem.  The problem has been resolved since onset.  Associated symptoms include no fatigue and no headaches.  Treatments tried: bupropion - started for smoking cessation.  Compliance with treatment is good.  Previous treatment provided significant (cutting back successfully on smoking) relief.  Recurrent vomiting - this occurs almost every day, usually after work.  Sometimes at work.  Emesis is yellow fluid, no blood or food products.  She take zofran if the vomiting persists past one episode.    Elevated Liver tests - noted after treatment for UTI with Bactrim.  Has not had any GI or other follow up with specific labs.  Pelvic pain - right lower pelvis - aching discomfort most days.  Radiates to her right leg and prevents her from walking the dog.  Last menses was February at the latest.  Several pregnancy tests at home and ER negative.  No pap smear in years.  Hx of BTL 10 yrs ago.  The following radiology reports are reviewed from several ER visits:  January onset of UTI/pyelonephritis.  Seen in ER 08/21/20.   CT abdomen: Her CT renal stone study showed IMPRESSION:  1. No renal stones or hydronephrosis.  2. No evidence to suggest acute appendicitis.  3. Hepatic steatosis.  4. Probable right adnexal cyst for which six-week follow-up ultrasound is  recommended.   ADDENDUM (09/25/2020 6:03 AM):  On review,the following additional findings were  noted:  1. Diffuse low-attenuation of liver consistent with diffuse hepatic steatosis. Additional focal fatty infiltration along the falciform ligament.  2. There is mild wall thickening of the gastric fundusand body with focal thinning of the gastric wall along the distal body on series 2 image 44, series 4 image 31. There is adjacent subtle soft tissue stranding along the greater curvature of the stomach. While not fully evaluated on this examination, could reflect gastritis and/or possible gastric ulcer. Correlation with clinical symptoms and consider GI consultation for further assessment.  3. There are a few prominent loops of small bowel in the left abdomen, however no significant inflammatory changes are seen.  4. There is cortical scarring in the right kidney.   [place]: 09/2020 done on ER visit for nausea/abdominal pain ImpressionPer[facility] by [facility] 1.Small volume pelvic free fluid which demonstrates scattered foci of internal echogenic debris, which could reflect small volume hemoperitoneum in the setting of recent left ovarian follicle rupture (described in detail above).  2.No evidence suggest ovarian torsion at the time of examination.  ++++++++++++++++++++  ADDENDUM (09/25/2020 7:47 AM):  Cystic changes within the endometrium, which may represent endometrial hyperplasia. Other endometrial pathology/malignancy cannot be excluded. Recommend clinical correlation.    Lab Results  Component Value Date   CREATININE 0.49 08/10/2020   BUN <5 (L) 08/10/2020   NA 125 (L) 08/10/2020   K 3.8 08/10/2020   CL 87 (L) 08/10/2020   CO2 26 08/10/2020   No results found for: CHOL,  HDL, LDLCALC, LDLDIRECT, TRIG, CHOLHDL No results found for: TSH No results found for: HGBA1C Lab Results  Component Value Date   WBC 9.6 08/10/2020   HGB 11.5 (L) 08/10/2020   HCT 33.0 (L) 08/10/2020   MCV 92.7 08/10/2020   PLT 144 (L) 08/10/2020   Lab Results  Component Value Date   ALT 73 (H) 08/10/2020   AST  82 (H) 08/10/2020   ALKPHOS 57 08/10/2020   BILITOT 1.7 (H) 08/10/2020     Review of Systems  Constitutional: Negative for chills, fatigue, fever and unexpected weight change.  HENT: Negative for trouble swallowing.   Respiratory: Negative for cough, chest tightness and shortness of breath.   Cardiovascular: Negative for chest pain, palpitations and leg swelling.  Gastrointestinal: Positive for abdominal pain and vomiting. Negative for abdominal distention, blood in stool, constipation and diarrhea.  Genitourinary: Positive for menstrual problem (none since February ). Negative for dysuria.  Musculoskeletal: Negative for arthralgias.  Neurological: Negative for dizziness and headaches.  Psychiatric/Behavioral: Positive for depression. Negative for dysphoric mood and sleep disturbance. The patient is not nervous/anxious.     There are no problems to display for this patient.   Allergies  Allergen Reactions  . Morphine Nausea And Vomiting    Past Surgical History:  Procedure Laterality Date  . TONSILLECTOMY    . TUBAL LIGATION      Social History   Tobacco Use  . Smoking status: Current Every Day Smoker    Packs/day: 0.25    Years: 20.00    Pack years: 5.00    Types: Cigarettes  . Smokeless tobacco: Never Used  Vaping Use  . Vaping Use: Never used  Substance Use Topics  . Alcohol use: Yes    Alcohol/week: 6.0 - 9.0 standard drinks    Types: 6 - 9 Standard drinks or equivalent per week  . Drug use: No     Medication list has been reviewed and updated.  Current Meds  Medication Sig  . buPROPion (WELLBUTRIN XL) 150 MG 24 hr tablet Take 3 tablets by mouth daily.  Marland Kitchen losartan (COZAAR) 50 MG tablet Take 1 tablet by mouth daily.  Marland Kitchen omeprazole (PRILOSEC) 20 MG capsule Take 20 mg by mouth daily.    PHQ 2/9 Scores 12/28/2020  PHQ - 2 Score 0  PHQ- 9 Score 2    GAD 7 : Generalized Anxiety Score 12/28/2020  Nervous, Anxious, on Edge 0  Control/stop worrying 0  Worry  too much - different things 0  Trouble relaxing 0  Restless 0  Easily annoyed or irritable 0  Afraid - awful might happen 0  Total GAD 7 Score 0  Anxiety Difficulty Not difficult at all    BP Readings from Last 3 Encounters:  12/28/20 138/88  12/11/20 (!) 161/92  08/10/20 (!) 181/99    Physical Exam Vitals and nursing note reviewed.  Constitutional:      General: She is not in acute distress.    Appearance: Normal appearance. She is well-developed.  HENT:     Head: Normocephalic and atraumatic.  Neck:     Vascular: No carotid bruit.  Cardiovascular:     Rate and Rhythm: Normal rate and regular rhythm.     Pulses: Normal pulses.  Pulmonary:     Effort: Pulmonary effort is normal. No respiratory distress.     Breath sounds: No wheezing or rhonchi.  Abdominal:     General: Abdomen is flat.     Palpations: Abdomen is soft.  Tenderness: There is abdominal tenderness in the right lower quadrant. There is no right CVA tenderness, left CVA tenderness, guarding or rebound.  Musculoskeletal:     Cervical back: Normal range of motion.  Lymphadenopathy:     Cervical: No cervical adenopathy.  Skin:    General: Skin is warm and dry.     Findings: No rash.  Neurological:     Mental Status: She is alert and oriented to person, place, and time.  Psychiatric:        Attention and Perception: Attention normal.        Mood and Affect: Mood normal.        Speech: Speech normal.     Wt Readings from Last 3 Encounters:  12/28/20 133 lb 3.2 oz (60.4 kg)  12/11/20 135 lb (61.2 kg)  08/10/20 133 lb (60.3 kg)    BP 138/88   Pulse 92   Ht 5\' 3"  (1.6 m)   Wt 133 lb 3.2 oz (60.4 kg)   LMP 09/25/2020 (Within Days)   SpO2 100%   BMI 23.60 kg/m   Assessment and Plan: 1. Pelvic pain in female Abnormal 09/27/2020 and amenorrhea noted Will obtain FSH/LH - Ambulatory referral to Obstetrics / Gynecology  2. Vomiting without nausea, intractability of vomiting not specified, unspecified  vomiting type Continue zofran PRN; continue omeprazole  Rule out H Pylori, check CBC for anemia Refer to GI - H. pylori breath test - CBC with Differential/Platelet  3. Elevated liver function tests Repeat labs  - Comprehensive metabolic panel  4. Gastroesophageal reflux disease, unspecified whether esophagitis present - Ambulatory referral to Gastroenterology  5. Tobacco use disorder Continue cutting back while taking Bupropion  6. Amenorrhea - FSH/LH  7. Essential hypertension Clinically stable exam with well controlled BP on losartan. Tolerating medications without side effects at this time. Pt to continue current regimen and low sodium diet; benefits of regular exercise as able discussed.   Partially dictated using [place]. Any errors are unintentional.  Animal nutritionist, MD [facility]  12/28/2020

## 2020-12-31 ENCOUNTER — Encounter: Payer: Self-pay | Admitting: *Deleted

## 2021-01-01 ENCOUNTER — Encounter: Payer: Self-pay | Admitting: *Deleted

## 2021-01-01 ENCOUNTER — Telehealth: Payer: Self-pay

## 2021-01-01 NOTE — Telephone Encounter (Signed)
[facility] referring for pelvic pain in female.Attempt to reach patient to scheduled referral. Patient voicemail box is full unable to leave a message.

## 2021-01-02 ENCOUNTER — Telehealth: Payer: Self-pay

## 2021-01-02 NOTE — Telephone Encounter (Signed)
Tried calling pt to remind her to get her labs drawn from her appt. No vm and could not leave a msg. [facility] may tell patient to get her labs done if she returns the call.

## 2021-01-03 LAB — COMPREHENSIVE METABOLIC PANEL
ALT: 113 IU/L — ABNORMAL HIGH (ref 0–32)
AST: 240 IU/L — ABNORMAL HIGH (ref 0–40)
Albumin/Globulin Ratio: 2 (ref 1.2–2.2)
Albumin: 5 g/dL — ABNORMAL HIGH (ref 3.8–4.8)
Alkaline Phosphatase: 94 IU/L (ref 44–121)
BUN/Creatinine Ratio: 7 — ABNORMAL LOW (ref 9–23)
BUN: 5 mg/dL — ABNORMAL LOW (ref 6–20)
Bilirubin Total: 0.4 mg/dL (ref 0.0–1.2)
CO2: 23 mmol/L (ref 20–29)
Calcium: 10.1 mg/dL (ref 8.7–10.2)
Chloride: 96 mmol/L (ref 96–106)
Creatinine, Ser: 0.67 mg/dL (ref 0.57–1.00)
Globulin, Total: 2.5 g/dL (ref 1.5–4.5)
Glucose: 80 mg/dL (ref 65–99)
Potassium: 4.7 mmol/L (ref 3.5–5.2)
Sodium: 135 mmol/L (ref 134–144)
Total Protein: 7.5 g/dL (ref 6.0–8.5)
eGFR: 115 mL/min/{1.73_m2} (ref >59)

## 2021-01-03 LAB — CBC WITH DIFFERENTIAL/PLATELET
Basophils Absolute: 0.1 10*3/uL (ref 0.0–0.2)
Basos: 1 %
EOS (ABSOLUTE): 0.1 10*3/uL (ref 0.0–0.4)
Eos: 1 %
Hematocrit: 34.2 % (ref 34.0–46.6)
Hemoglobin: 11.8 g/dL (ref 11.1–15.9)
Immature Grans (Abs): 0.1 10*3/uL (ref 0.0–0.1)
Immature Granulocytes: 1 %
Lymphocytes Absolute: 1.5 10*3/uL (ref 0.7–3.1)
Lymphs: 25 %
MCH: 33.3 pg — ABNORMAL HIGH (ref 26.6–33.0)
MCHC: 34.5 g/dL (ref 31.5–35.7)
MCV: 97 fL (ref 79–97)
Monocytes Absolute: 0.7 10*3/uL (ref 0.1–0.9)
Monocytes: 12 %
Neutrophils Absolute: 3.7 10*3/uL (ref 1.4–7.0)
Neutrophils: 60 %
Platelets: 229 10*3/uL (ref 150–450)
RBC: 3.54 x10E6/uL — ABNORMAL LOW (ref 3.77–5.28)
RDW: 13.3 % (ref 11.7–15.4)
WBC: 6.1 10*3/uL (ref 3.4–10.8)

## 2021-01-03 LAB — FSH/LH
FSH: 9.4 m[IU]/mL
LH: 11.4 m[IU]/mL

## 2021-01-03 LAB — H. PYLORI BREATH TEST: H pylori Breath Test: NEGATIVE

## 2021-01-03 NOTE — Progress Notes (Signed)
Called pt could not leave VM. VM was full.  [facility] nurse may give results to patient if they return call to the clinic. CRM has also been created for this message for call center purposes  KP

## 2021-01-10 ENCOUNTER — Encounter: Payer: Self-pay | Admitting: *Deleted

## 2021-01-17 ENCOUNTER — Ambulatory Visit (INDEPENDENT_AMBULATORY_CARE_PROVIDER_SITE_OTHER): Payer: No Typology Code available for payment source | Admitting: Obstetrics and Gynecology

## 2021-01-17 ENCOUNTER — Other Ambulatory Visit (HOSPITAL_COMMUNITY)
Admission: RE | Admit: 2021-01-17 | Discharge: 2021-01-17 | Disposition: A | Discharge status: Home / Self Care | Payer: No Typology Code available for payment source | Source: Ambulatory Visit | Attending: Obstetrics and Gynecology | Admitting: Obstetrics and Gynecology

## 2021-01-17 ENCOUNTER — Encounter: Payer: Self-pay | Admitting: Obstetrics and Gynecology

## 2021-01-17 ENCOUNTER — Other Ambulatory Visit: Payer: Self-pay

## 2021-01-17 VITALS — BP 185/98 | HR 119 | Ht 63.0 in | Wt 137.0 lb

## 2021-01-17 DIAGNOSIS — R1031 Right lower quadrant pain: Secondary | ICD-10-CM

## 2021-01-17 DIAGNOSIS — Z124 Encounter for screening for malignant neoplasm of cervix: Secondary | ICD-10-CM

## 2021-01-17 NOTE — Progress Notes (Signed)
Obstetrics & Gynecology Office Visit   Chief Complaint:  Chief Complaint  Patient presents with   Gynecologic Exam    Ref for Pap   The patient is seen in referral at the request of Reubin Milan, MD from [facility] for right lower quadrant pelvic pain.   History of Present Illness: 40 y.o. No obstetric history on file. female who is seen in referral from Reubin Milan, MD from [facility] for right lower quadrant pelvic pain. She has pain in her right lower quadrant. She believes it started 6 months ago.  The pain is constant.  She rates the pain as severe at times, though it waxes and wanes.  She describes the pain like a pressure.  She says the pain can radiate from her back to her right gluteal region.  Alleviating factors: nothing she can identify.  Aggravating factors: getting pulled (like walking her dog), other exertion.  Associated symptoms: vomiting, but this is random.  She denies hematuria. She had a CT scan in January and February. No obvious etiology noted. She had a pelvic ultrasound in 09/2020 that was unremarkable.  She does not recall the last time she had a pap smear. She does not believe that she has ever had an abnormal pap smear. Her last period was in February. She had normal, monthly periods prior to this.  She passed an odd looking discharge that appeared solid.   She denies diarrhea and constipation.  She denies hematochezia and melena.  She denies hematuria, frequency, dysuria.      FINDINGS:  UTERUS/CERVIX: The uterus is anteverted. No focal myometrial mass was seen. Scattered nabothian cysts, the largest measuring 1.3 x 1.0 x 1.2 cm.   The endometrium was normal in thickness.Few tiny anechoic lesions are seen within the endometrial canal along the uterine fundus, the largest of which measuring up to 0.3 x 0.2 x 0.3 cm, which may reflect small areas of free fluid abd/or tiny endometrial cysts.   OVARIES:  The ovaries were seen well  transvaginally. Small cystic areas were seen within within the bilateral ovaries, compatible with follicles. Appropriate arterial inflow and venous outflow of the ovaries was documented on color and spectral Doppler imaging.   Of note, a large anechoic and avascular cystic structure was identified within the left adnexal region at the start of the examination, which measured roughly 2.7 cm in diameter. The end of the exam, this lesion was no longer visible and small volume free fluid was seen throughout the posterior cul-de-sac, potentially reflecting follicle rupture with associated small volume hemoperitoneum.     OTHER: Small volume free fluid noted within the posterior cul-de-sac, appears to demonstrate internal foci of echogenic debris, which may reflect small volume hemoperitoneum.   IMPRESSION:  1.Small volume pelvic free fluid which demonstrates scattered foci of internal echogenic debris, which could reflect small volume hemoperitoneum in the setting of recent left ovarian follicle rupture (described in detail above).  2.No evidence suggest ovarian torsion at the time of examination.   Past Medical History:  Diagnosis Date   Hypertension     Past Surgical History:  Procedure Laterality Date   TONSILLECTOMY     TUBAL LIGATION      Gynecologic History: No LMP recorded.  Obstetric History: [id number], SVD x 2 (13 and 16 years)  Family History  Problem Relation Age of Onset   Diabetes Maternal Grandmother    Heart disease Maternal Grandmother    Heart attack Maternal Grandmother  Heart disease Maternal Grandfather     Social History   Socioeconomic History   Marital status: Married    Spouse name: Brad   Number of children: 2   Years of education: Not on file   Highest education level: Not on file  Occupational History   Not on file  Tobacco Use   Smoking status: Every Day    Packs/day: 0.25    Years: 20.00    Pack years: 5.00    Types: Cigarettes   Smokeless  tobacco: Never  Vaping Use   Vaping Use: Never used  Substance and Sexual Activity   Alcohol use: Yes    Alcohol/week: 6.0 - 9.0 standard drinks    Types: 6 - 9 Standard drinks or equivalent per week   Drug use: No   Sexual activity: Not Currently  Other Topics Concern   Not on file  Social History Narrative   Not on file   Social Determinants of Health   Financial Resource Strain: Not on file  Food Insecurity: Not on file  Transportation Needs: Not on file  Physical Activity: Not on file  Stress: Not on file  Social Connections: Not on file  Intimate Partner Violence: Not on file    Allergies  Allergen Reactions   Morphine Nausea And Vomiting    Prior to Admission medications   Medication Sig Start Date End Date Taking? Authorizing Provider  buPROPion (WELLBUTRIN XL) 150 MG 24 hr tablet Take 3 tablets by mouth daily. 10/26/20  Yes [provider]  losartan (COZAAR) 50 MG tablet Take 1 tablet by mouth daily. 10/26/20  Yes [provider]  omeprazole (PRILOSEC) 20 MG capsule Take 20 mg by mouth daily.   Yes [provider]    Review of Systems  Constitutional: Negative.   HENT: Negative.    Eyes: Negative.   Respiratory: Negative.    Cardiovascular: Negative.   Gastrointestinal: Negative.   Genitourinary: Negative.   Musculoskeletal: Negative.   Skin: Negative.   Neurological: Negative.   Psychiatric/Behavioral: Negative.      Physical Exam BP (!) 185/98   Pulse (!) 119   Ht 5\' 3"  (1.6 m)   Wt 137 lb (62.1 kg)   BMI 24.27 kg/m  No LMP recorded. Physical Exam Constitutional:      General: She is not in acute distress.    Appearance: Normal appearance. She is well-developed.  Genitourinary:     Vulva, bladder and urethral meatus normal.     Right Labia: No rash, tenderness, lesions, skin changes or Bartholin's cyst.    Left Labia: No tenderness, lesions, skin changes, Bartholin's cyst or rash.    No inguinal adenopathy present in  the right or left side.    Pelvic Tanner Score: 5/5.    Vaginal bleeding (scant blood in vaginal vault) present.     No vaginal discharge, tenderness or ulceration.     No vaginal prolapse present.    No vaginal atrophy present.     Right Adnexa: not tender, not full and no mass present.    Left Adnexa: not tender, not full and no mass present.    No cervical motion tenderness, friability, lesion or polyp.     No parametrium nodularity present.    Uterus is not enlarged, fixed or tender.     Uterus is anteverted.     No urethral tenderness or mass present.     Pelvic exam was performed with patient in the lithotomy position.  HENT:  Head: Normocephalic and atraumatic.  Eyes:     General: No scleral icterus.    Conjunctiva/sclera: Conjunctivae normal.  Cardiovascular:     Rate and Rhythm: Normal rate and regular rhythm.     Heart sounds: No murmur heard.   No friction rub. No gallop.  Pulmonary:     Effort: Pulmonary effort is normal. No respiratory distress.     Breath sounds: Normal breath sounds. No wheezing or rales.  Abdominal:     General: Bowel sounds are normal. There is no distension.     Palpations: Abdomen is soft. There is no mass.     Tenderness: There is no abdominal tenderness. There is no guarding or rebound.     Hernia: There is no hernia in the left inguinal area or right inguinal area.  Musculoskeletal:        General: Normal range of motion.     Cervical back: Normal range of motion and neck supple.  Lymphadenopathy:     Lower Body: No right inguinal adenopathy. No left inguinal adenopathy.  Neurological:     General: No focal deficit present.     Mental Status: She is alert and oriented to person, place, and time.     Cranial Nerves: No cranial nerve deficit.  Skin:    General: Skin is warm and dry.     Findings: No erythema.  Psychiatric:        Mood and Affect: Mood normal.        Behavior: Behavior normal.        Judgment: Judgment normal.     Female chaperone present for pelvic and breast  portions of the physical exam  Assessment: 40 y.o. No obstetric history on file. female here for  1. Right lower quadrant abdominal pain   2. Pap smear for cervical cancer screening      Plan: Problem List Items Addressed This Visit   None Visit Diagnoses     Right lower quadrant abdominal pain    -  Primary   Relevant Orders   Cytology - PAP   US PELVIC COMPLETE WITH TRANSVAGINAL   Pap smear for cervical cancer screening       Relevant Orders   Cytology - PAP      Discussed pain in this region.  She has some findings on CT that could suggest a GI etiology. Nothing on imaging suggests a gynecologic etiology. Pap smear performed today. Discussed wide differential. Her pain is fairly non-specific.  No findings to suggest a specific cause today.    Will repeat pelvic ultrasound and consider endometrial biopsy. However, she has had very little bleeding since February. So, withdrawal bleed provocation is considered.  She has had negative STI screening in February and believes she has very little chance of having an STI.  No obvious urologic etiology.  No evident hernia in this region. Discussed endometriosis and pelvic congestion syndrome, which can be hard to diagnose with imaging and exam.    Will follow up accordingly, based on ultrasound.  A total of 42 minutes were spent face-to-face with the patient as well as preparation, review, communication, and documentation during this encounter.    Thomasene Mohair, MD 01/17/2021 4:49 PM    CC: Reubin Milan, MD [address] [place],  [place] [address]

## 2021-01-25 LAB — CYTOLOGY - PAP
Comment: NEGATIVE
Comment: NEGATIVE
Diagnosis: HIGH — AB
HPV 16: POSITIVE — AB
HPV 18 / 45: NEGATIVE
High risk HPV: POSITIVE — AB

## 2021-01-26 ENCOUNTER — Telehealth: Payer: Self-pay | Admitting: Obstetrics and Gynecology

## 2021-01-26 NOTE — Telephone Encounter (Signed)
Attempted to call to discuss Pap smear result.  There was no answer and the voicemail was not set up.  We will try again at a later time.

## 2021-02-06 ENCOUNTER — Telehealth: Payer: Self-pay | Admitting: Obstetrics and Gynecology

## 2021-02-06 ENCOUNTER — Encounter: Payer: Self-pay | Admitting: Obstetrics and Gynecology

## 2021-02-06 NOTE — Telephone Encounter (Signed)
Unable to leave VM. Will send message through myChart and send letter.

## 2021-02-09 ENCOUNTER — Encounter: Payer: Self-pay | Admitting: Internal Medicine

## 2021-02-15 ENCOUNTER — Ambulatory Visit: Payer: No Typology Code available for payment source

## 2021-02-18 ENCOUNTER — Ambulatory Visit: Payer: No Typology Code available for payment source | Admitting: Obstetrics and Gynecology

## 2021-03-15 ENCOUNTER — Ambulatory Visit: Payer: No Typology Code available for payment source

## 2021-03-19 ENCOUNTER — Ambulatory Visit: Payer: No Typology Code available for payment source | Admitting: Obstetrics and Gynecology

## 2021-03-20 ENCOUNTER — Ambulatory Visit: Payer: No Typology Code available for payment source | Admitting: Gastroenterology

## 2021-03-20 ENCOUNTER — Other Ambulatory Visit: Payer: Self-pay

## 2021-03-20 NOTE — Progress Notes (Deleted)
Gastroenterology Consultation  Referring Provider:     Reubin Milan, MD Primary Care Physician:  Reubin Milan, MD Primary Gastroenterologist:  Dr. Servando Snare     Reason for Consultation:     GERD        HPI:   Gabriela White is a 40 y.o. y/o female referred for consultation & management of GERD by Dr. Judithann Graves, Nyoka Cowden, MD.  This patient comes in today after being seen in 2014 at [facility] gastroenterology for acid reflux.  The patient was then evaluated in January of this year by [facility] for abnormal liver enzymes and was thought to have abnormal liver enzymes due to Bactrim.  4 months later the patient's liver enzymes were tested again and had gone up.The patient's liver enzymes were last normal back in 2019.  The trends of her liver enzymes show:  Component     Latest Ref Rng & Units 12/21/2017 08/10/2020 09/24/2020 01/02/2021  Total Bilirubin     0.0 - 1.2 mg/dL 0.8 1.7 (H)  0.4  Alkaline Phosphatase     44 - 121 IU/L 44 57 78 94  AST     0 - 40 IU/L 41 82 (H) 121 (A) 240 (H)  ALT     0 - 32 IU/L 27 73 (H) 82 (A) 113 (H)   As mentioned above the patient was followed by [facility] for this.In February of this year the patient had a CT scan of the abdomen that showed:  1. Diffuse low-attenuation of liver consistent with diffuse hepatic steatosis. Additional focal fatty infiltration along the falciform ligament.   2. There is mild wall thickening of the gastric fundus and body with focal thinning of the gastric wall along the distal body on series 2 image 44, series 4 image 31. There is adjacent subtle soft tissue stranding along the greater curvature of the stomach. While not fully evaluated on this examination, could reflect gastritis and/or possible gastric ulcer. Correlation with clinical symptoms and consider GI consultation for further assessment.   3. There are a few prominent loops of small bowel in the left abdomen, however no significant inflammatory changes are seen.   4.  There is cortical scarring in the right kidney.      Past Medical History:  Diagnosis Date   Hypertension     Past Surgical History:  Procedure Laterality Date   TONSILLECTOMY     TUBAL LIGATION      Prior to Admission medications   Medication Sig Start Date End Date Taking? Authorizing Provider  buPROPion (WELLBUTRIN XL) 150 MG 24 hr tablet Take 3 tablets by mouth daily. 10/26/20   [provider]  losartan (COZAAR) 50 MG tablet Take 1 tablet by mouth daily. 10/26/20   [provider]  omeprazole (PRILOSEC) 20 MG capsule Take 20 mg by mouth daily.    [provider]    Family History  Problem Relation Age of Onset   Diabetes Maternal Grandmother    Heart disease Maternal Grandmother    Heart attack Maternal Grandmother    Heart disease Maternal Grandfather      Social History   Tobacco Use   Smoking status: Every Day    Packs/day: 0.25    Years: 20.00    Pack years: 5.00    Types: Cigarettes   Smokeless tobacco: Never  Vaping Use   Vaping Use: Never used  Substance Use Topics   Alcohol use: Yes    Alcohol/week: 6.0 -  9.0 standard drinks    Types: 6 - 9 Standard drinks or equivalent per week   Drug use: No    Allergies as of 03/20/2021 - Review Complete 01/17/2021  Allergen Reaction Noted   Morphine Nausea And Vomiting 08/06/2020    Review of Systems:    All systems reviewed and negative except where noted in HPI.   Physical Exam:  There were no vitals taken for this visit. No LMP recorded. General:   Alert,  Well-developed, well-nourished, pleasant and cooperative in NAD Head:  Normocephalic and atraumatic. Eyes:  Sclera clear, no icterus.   Conjunctiva pink. Ears:  Normal auditory acuity. Neck:  Supple; no masses or thyromegaly. Lungs:  Respirations even and unlabored.  Clear throughout to auscultation.   No wheezes, crackles, or rhonchi. No acute distress. Heart:  Regular rate and rhythm; no murmurs, clicks, rubs, or  gallops. Abdomen:  Normal bowel sounds.  No bruits.  Soft, non-tender and non-distended without masses, hepatosplenomegaly or hernias noted.  No guarding or rebound tenderness.  Negative Carnett sign.   Rectal:  Deferred.  Pulses:  Normal pulses noted. Extremities:  No clubbing or edema.  No cyanosis. Neurologic:  Alert and oriented x3;  grossly normal neurologically. Skin:  Intact without significant lesions or rashes.  No jaundice. Lymph Nodes:  No significant cervical adenopathy. Psych:  Alert and cooperative. Normal mood and affect.  Imaging Studies: No results found.  Assessment and Plan:   Gabriela White is a 40 y.o. y/o female ***    Midge Minium, MD. Clementeen Graham    Note: This dictation was prepared with Dragon dictation along with smaller phrase technology. Any transcriptional errors that result from this process are unintentional.

## 2021-06-17 ENCOUNTER — Encounter: Payer: Self-pay | Admitting: Obstetrics and Gynecology

## 2021-06-17 ENCOUNTER — Telehealth: Payer: Self-pay | Admitting: Obstetrics and Gynecology

## 2021-06-17 NOTE — Telephone Encounter (Signed)
Attempted to reach the patient.  The mailbox is full, so I could not leave a message.

## 2021-06-21 NOTE — Telephone Encounter (Signed)
Attempted to reach patient again on 06/21/21.  Could not leave a message because the mailbox was full.

## 2021-06-26 ENCOUNTER — Other Ambulatory Visit: Payer: Self-pay

## 2021-06-26 ENCOUNTER — Ambulatory Visit (INDEPENDENT_AMBULATORY_CARE_PROVIDER_SITE_OTHER): Payer: No Typology Code available for payment source | Admitting: Gastroenterology

## 2021-06-26 ENCOUNTER — Encounter: Payer: Self-pay | Admitting: Gastroenterology

## 2021-06-26 VITALS — BP 162/93 | HR 89 | Temp 97.9°F | Ht 63.0 in | Wt 134.2 lb

## 2021-06-26 DIAGNOSIS — K759 Inflammatory liver disease, unspecified: Secondary | ICD-10-CM | POA: Diagnosis not present

## 2021-06-26 DIAGNOSIS — K7010 Alcoholic hepatitis without ascites: Secondary | ICD-10-CM | POA: Diagnosis not present

## 2021-06-26 MED ORDER — PANTOPRAZOLE SODIUM 40 MG PO TBEC: 40.0000 mg | DELAYED_RELEASE_TABLET | Freq: Every day | ORAL | 3 refills | Status: DC

## 2021-06-26 NOTE — Progress Notes (Signed)
Gastroenterology Consultation  Referring Provider:     Reubin Milan, MD Primary Care Physician:  Reubin Milan, MD Primary Gastroenterologist:  Dr. Servando Snare     Reason for Consultation:     GERD        HPI:   Gabriela White is a 41 y.o. y/o female referred for consultation & management of GERD by Dr. Judithann Graves, Nyoka Cowden, MD. This patient comes in to see me today with a history of GERD.  The patient has also had abnormal liver enzymes that she had seen a gastroenterologist in [facility] for with a suggestion that the patient be followed by hepatology.  The patient had been on Bactrim and the thought was that this may have caused the abnormal liver enzymes.  The trends of her liver enzymes have shown:  Component     Latest Ref Rng & Units 08/10/2020 09/24/2020 01/02/2021  Total Bilirubin     0.0 - 1.2 mg/dL 1.7 (H)  0.4  Alkaline Phosphatase     44 - 121 IU/L 57 78 94  AST     0 - 40 IU/L 82 (H) 121 (A) 240 (H)  ALT     0 - 32 IU/L 73 (H) 82 (A) 113 (H)   Her liver enzymes were normal 3 years ago.  The patient had a CT scan of the abdomen at [facility] in February of this year that showed:   1. Diffuse low-attenuation of liver consistent with diffuse hepatic steatosis. Additional focal fatty infiltration along the falciform ligament.   2. There is mild wall thickening of the gastric fundus and body with focal thinning of the gastric wall along the distal body on series 2 image 44, series 4 image 31. There is adjacent subtle soft tissue stranding along the greater curvature of the stomach. While not fully evaluated on this examination, could reflect gastritis and/or possible gastric ulcer. Correlation with clinical symptoms and consider GI consultation for further assessment.   3. There are a few prominent loops of small bowel in the left abdomen, however no significant inflammatory changes are seen.   4. There is cortical scarring in the right kidney.    The patient was seen in May by her  primary care provider who noted that the patient was having recurrent vomiting.  The patient had an H. pylori breath test checked at that time which was negative.  The patient reports that she has had pain in the right upper quadrant that she states is where she feels a hard lump.  She reports that the lump is very tender and she is mainly concerned about the pain and the lump.  She also reports that she has vomiting every morning.  She continues to drink heavily and has been for many years.  Past Medical History:  Diagnosis Date  . Hypertension     Past Surgical History:  Procedure Laterality Date  . TONSILLECTOMY    . TUBAL LIGATION      Prior to Admission medications   Medication Sig Start Date End Date Taking? Authorizing Provider  buPROPion (WELLBUTRIN XL) 150 MG 24 hr tablet Take 3 tablets by mouth daily. 10/26/20   [provider]  losartan (COZAAR) 50 MG tablet Take 1 tablet by mouth daily. 10/26/20   [provider]  omeprazole (PRILOSEC) 20 MG capsule Take 20 mg by mouth daily.    [provider]    Family History  Problem Relation Age of Onset  . Diabetes  Maternal Grandmother   . Heart disease Maternal Grandmother   . Heart attack Maternal Grandmother   . Heart disease Maternal Grandfather      Social History   Tobacco Use  . Smoking status: Every Day    Packs/day: 0.25    Years: 20.00    Pack years: 5.00    Types: Cigarettes  . Smokeless tobacco: Never  Vaping Use  . Vaping Use: Never used  Substance Use Topics  . Alcohol use: Yes    Alcohol/week: 6.0 - 9.0 standard drinks    Types: 6 - 9 Standard drinks or equivalent per week  . Drug use: No    Allergies as of 06/26/2021 - Review Complete 01/17/2021  Allergen Reaction Noted  . Morphine Nausea And Vomiting 08/06/2020    Review of Systems:    All systems reviewed and negative except where noted in HPI.   Physical Exam:  There were no vitals taken for this visit. No LMP  recorded. General:   Alert,  Well-developed, well-nourished, pleasant and cooperative in NAD Head:  Normocephalic and atraumatic. Eyes:  Sclera clear, no icterus.   Conjunctiva pink. Ears:  Normal auditory acuity. Neck:  Supple; no masses or thyromegaly. Lungs:  Respirations even and unlabored.  Clear throughout to auscultation.   No wheezes, crackles, or rhonchi. No acute distress. Heart:  Regular rate and rhythm; no murmurs, clicks, rubs, or gallops. Abdomen:  Normal bowel sounds.  No bruits.  Soft, Positive tenderness to one finger palpation while flexing the abdominal wall muscles by raising the patient's legs 6 inches above the exam table and non-distended without masses, hepatosplenomegaly or hernias noted.  No guarding or rebound tenderness.  Positive Carnett sign.   Rectal:  Deferred.  Pulses:  Normal pulses noted. Extremities:  No clubbing or edema.  No cyanosis. Neurologic:  Alert and oriented x3;  grossly normal neurologically. Skin:  Intact without significant lesions or rashes.  No jaundice. Lymph Nodes:  No significant cervical adenopathy. Psych:  Alert and cooperative. Normal mood and affect.  Imaging Studies: No results found.  Assessment and Plan:   Gabriela White is a 40 y.o. y/o female who comes in with abdominal pain that is clearly musculoskeletal. The patient is clearly unwilling to discuss her alcohol use and abuse and the possible progression to cirrhosis.  When the subject was brought up she would continuously bring up that she only wanted to talk about her abdominal pain.  The abdominal pain as stated above is muscular skeletal and likely caused by her frequent vomiting.  The patient had imaging that did not show any abnormality in that area and after flexing the abdominal wall muscles the patient was continuing to have pain and became tearful even after the legs were lowered.  The patient has been told that morning nausea and vomiting can be due to acid reflux  whereupon she states that she did feel a low better when she was taking omeprazole but was taking it in the morning.  The patient has been encouraged to take Protonix 40 mg in the evening so that she can have maximal acid suppression while she is lying down. The patient will also have blood work sent off for other possible causes of abnormal liver enzymes. The patient has been explained the plan and agrees with it.     Midge Minium, MD. Clementeen Graham    Note: This dictation was prepared with Dragon dictation along with smaller phrase technology. Any transcriptional errors that result from this  process are unintentional.

## 2021-08-09 ENCOUNTER — Telehealth: Payer: Self-pay | Admitting: Obstetrics and Gynecology

## 2021-08-09 NOTE — Telephone Encounter (Signed)
Called pt to schedule f/u appointment with MD.  Someone answered phone, but said that the pt was not available.  I left a message and asked patient to call me back at the office.

## 2021-08-13 ENCOUNTER — Ambulatory Visit: Payer: Self-pay | Admitting: *Deleted

## 2021-08-13 ENCOUNTER — Telehealth: Payer: Self-pay | Admitting: Internal Medicine

## 2021-08-13 NOTE — Telephone Encounter (Signed)
°  Chief Complaint: unable to tolerate eating or drinking without vomiting , requesting a 2nd GI consult Symptoms: vomiting after eating or drinking within 5 minutes to 2 hours  Frequency: x 1 month but getting worse  Pertinent Negatives: Patient denies dizziness, lightheaded, abdominal pain Disposition: [] ED /[] Urgent Care (no appt availability in office) / [x] Appointment(In office/virtual)/ []  [place] [facility]/ [] Home Care/ [] Refused Recommended Disposition /[] [facility]/ []  Follow-up with PCP Additional Notes:  Appt scheduled for 08/16/21. Requesting GI consult to , MD at [facility] . Patient did not feel last GI referral would be counterproductive to see again.             Reason for Disposition  Vomiting is a chronic symptom (recurrent or ongoing AND present > 4 weeks)  Answer Assessment - Initial Assessment Questions 1. VOMITING SEVERITY: "How many times have you vomited in the past 24 hours?"     - MILD:  1 - 2 times/day    - MODERATE: 3 - 5 times/day, decreased oral intake without significant weight loss or symptoms of dehydration    - SEVERE: 6 or more times/day, vomits everything or nearly everything, with significant weight loss, symptoms of dehydration      Vomiting after intake of food or fluids  2. ONSET: "When did the vomiting begin?"      1 month ago  3. FLUIDS: "What fluids or food have you vomited up today?" "Have you been able to keep any fluids down?"     None  4. ABDOMINAL PAIN: "Are your having any abdominal pain?" If yes : "How bad is it and what does it feel like?" (e.g., crampy, dull, intermittent, constant)      Denies  5. DIARRHEA: "Is there any diarrhea?" If Yes, ask: "How many times today?"      "Some not much" 6. CONTACTS: "Is there anyone else in the family with the same symptoms?"      na 7. CAUSE: "What do you think is causing your vomiting?"     On going GI issues  8. HYDRATION STATUS: "Any signs of dehydration?" (e.g., dry  mouth [not only dry lips], too weak to stand) "When did you last urinate?"     Denies  9. OTHER SYMPTOMS: "Do you have any other symptoms?" (e.g., fever, headache, vertigo, vomiting blood or coffee grounds, recent head injury)     none 10. PREGNANCY: "Is there any chance you are pregnant?" "When was your last menstrual period?"       na  Protocols used: Vomiting-A-AH

## 2021-08-13 NOTE — Telephone Encounter (Signed)
Noted  KP 

## 2021-08-13 NOTE — Telephone Encounter (Signed)
Copied from CRM [id number]. Topic: Referral - Request for Referral >> Aug 13, 2021  9:58 AM Wyonia Hough E wrote: Has patient seen PCP for this complaint? Yes *If NO, is insurance requiring patient see PCP for this issue before PCP can refer them? Referral for which specialty: Laurette Schimke Preferred provider/office: Dr. Roberto Scales at [facility] / [address] / phone# [phone]/  Reason for referral: Pt had a GI consult for endoscopy and colonoscopy and would like a second opinion

## 2021-08-13 NOTE — Telephone Encounter (Signed)
PEC scheduled pt appt for 08/16/2021.  KP

## 2021-08-16 ENCOUNTER — Ambulatory Visit (INDEPENDENT_AMBULATORY_CARE_PROVIDER_SITE_OTHER): Payer: PRIVATE HEALTH INSURANCE | Admitting: Internal Medicine

## 2021-08-16 ENCOUNTER — Encounter: Payer: Self-pay | Admitting: Internal Medicine

## 2021-08-16 ENCOUNTER — Other Ambulatory Visit: Payer: Self-pay

## 2021-08-16 VITALS — BP 110/74 | HR 93 | Ht 63.0 in | Wt 126.4 lb

## 2021-08-16 DIAGNOSIS — R7989 Other specified abnormal findings of blood chemistry: Secondary | ICD-10-CM | POA: Diagnosis not present

## 2021-08-16 DIAGNOSIS — R112 Nausea with vomiting, unspecified: Secondary | ICD-10-CM | POA: Diagnosis not present

## 2021-08-16 DIAGNOSIS — R634 Abnormal weight loss: Secondary | ICD-10-CM

## 2021-08-16 DIAGNOSIS — M21372 Foot drop, left foot: Secondary | ICD-10-CM | POA: Diagnosis not present

## 2021-08-16 NOTE — Progress Notes (Signed)
Date:  08/16/2021   Name:  Gabriela White   DOB:  [birth year]   MRN:  [id number]   Chief Complaint: Vomiting (Patient is here today with her [place].)  Emesis  This is a chronic problem. The current episode started more than 1 year ago. The emesis has an appearance of bile. There has been no fever. Associated symptoms include diarrhea, dizziness and weight loss. Pertinent negatives include no abdominal pain, chest pain or chills. Patient seen [facility] in the past and was not happy with her care. Her symptoms has progressively gotten worse in the last 2 months. She is unable to keep down any fluids or foods. She has had recurrent vomiting multiple times per day over the past few months.  She has lost 10 lbs due to inability to eat.  She has some nausea.  She stopped her medications since she quickly vomits it up after swallowing. Yesterday she went to [employer] in [place] and got fluids.  She feels a bit better today. She is still drinking 4-5 alcoholic beverages 3-4 days per week.  Rarely drinks more. She wants to see another GI besides Dr. Allen Norris.  She went there to get an EGD and Colonoscopy but Dr. Allen Norris wanted to evaluate her elevated liver function tests.  Labs were ordered but she never had them done. We discussed that there is process of evaluation that is needed before just performing an invasive test.  Lab Results  Component Value Date   NA 135 01/02/2021   K 4.7 01/02/2021   CO2 23 01/02/2021   GLUCOSE 80 01/02/2021   BUN 5 (L) 01/02/2021   CREATININE 0.67 01/02/2021   CALCIUM 10.1 01/02/2021   EGFR 115 01/02/2021   GFRNONAA 90 09/24/2020   Lab Results  Component Value Date   CHOL 193 05/20/2019   HDL 117 (A) 05/20/2019   LDLCALC 70 05/20/2019   TRIG 32 (A) 05/20/2019   No results found for: TSH No results found for: HGBA1C Lab Results  Component Value Date   WBC 6.1 01/02/2021   HGB 11.8 01/02/2021   HCT 34.2 01/02/2021   MCV 97 01/02/2021   PLT  229 01/02/2021   Lab Results  Component Value Date   ALT 113 (H) 01/02/2021   AST 240 (H) 01/02/2021   ALKPHOS 94 01/02/2021   BILITOT 0.4 01/02/2021   No results found for: 25OHVITD2, 25OHVITD3, VD25OH   Review of Systems  Constitutional:  Positive for fatigue, unexpected weight change and weight loss. Negative for chills and diaphoresis.  HENT:  Negative for trouble swallowing.   Respiratory:  Negative for chest tightness and shortness of breath.   Cardiovascular:  Negative for chest pain.  Gastrointestinal:  Positive for abdominal distention, diarrhea, nausea and vomiting. Negative for abdominal pain and rectal pain.  Genitourinary:  Negative for dysuria and urgency.  Musculoskeletal:  Positive for gait problem (left foot drop).  Neurological:  Positive for dizziness.  Psychiatric/Behavioral:  Negative for sleep disturbance.    Patient Active Problem List   Diagnosis Date Noted   Essential hypertension 12/28/2020   Pelvic pain in female 12/28/2020   Vomiting without nausea 12/28/2020   Elevated liver function tests 12/28/2020   Gastroesophageal reflux disease 12/28/2020   Tobacco use disorder 12/28/2020   Amenorrhea 12/28/2020    Allergies  Allergen Reactions   Morphine Nausea And Vomiting    Past Surgical History:  Procedure Laterality Date   TONSILLECTOMY     TUBAL LIGATION  Social History   Tobacco Use   Smoking status: Every Day    Packs/day: 0.25    Years: 20.00    Pack years: 5.00    Types: Cigarettes   Smokeless tobacco: Never  Vaping Use   Vaping Use: Never used  Substance Use Topics   Alcohol use: Yes    Alcohol/week: 6.0 - 9.0 standard drinks    Types: 6 - 9 Standard drinks or equivalent per week   Drug use: No     Medication list has been reviewed and updated.  No outpatient medications have been marked as taking for the 08/16/21 encounter (Office Visit) with Glean Hess, MD.    [facility] 2/9 Scores 08/16/2021 12/28/2020  PHQ - 2 Score  0 0  PHQ- 9 Score 6 2    GAD 7 : Generalized Anxiety Score 08/16/2021 12/28/2020  Nervous, Anxious, on Edge 0 0  Control/stop worrying 0 0  Worry too much - different things 0 0  Trouble relaxing 0 0  Restless 0 0  Easily annoyed or irritable 0 0  Afraid - awful might happen 0 0  Total GAD 7 Score 0 0  Anxiety Difficulty Not difficult at all Not difficult at all    BP Readings from Last 3 Encounters:  08/16/21 110/74  06/26/21 (!) 162/93  01/17/21 (!) 185/98    Physical Exam Vitals and nursing note reviewed.  Constitutional:      General: She is not in acute distress.    Appearance: Normal appearance. She is well-developed.  HENT:     Head: Normocephalic and atraumatic.  Eyes:     General: No scleral icterus. Cardiovascular:     Rate and Rhythm: Normal rate and regular rhythm.  Pulmonary:     Effort: Pulmonary effort is normal. No respiratory distress.     Breath sounds: No wheezing or rhonchi.  Abdominal:     General: Abdomen is flat. Bowel sounds are decreased. There is no distension.     Palpations: There is no mass.     Tenderness: There is no right CVA tenderness, left CVA tenderness or guarding.  Musculoskeletal:     Cervical back: Normal range of motion.     Comments: Left posterior splint  Lymphadenopathy:     Cervical: No cervical adenopathy.  Skin:    General: Skin is warm and dry.     Findings: No rash.  Neurological:     Mental Status: She is alert and oriented to person, place, and time.  Psychiatric:        Mood and Affect: Mood normal.        Behavior: Behavior normal.    Wt Readings from Last 3 Encounters:  08/16/21 126 lb 6.4 oz (57.3 kg)  06/26/21 134 lb 3.2 oz (60.9 kg)  01/17/21 137 lb (62.1 kg)    BP 110/74    Pulse 93    Ht 5' 3"  (1.6 m)    Wt 126 lb 6.4 oz (57.3 kg)    LMP  (Exact Date)    SpO2 100%    BMI 22.39 kg/m   Assessment and Plan: 1. Elevated liver function tests Needs to proceed with serologic testings for causes of liver  disease aside from alcohol Will send for the labs already ordered. Refer to [facility] at patients request. - Ambulatory referral to Gastroenterology  2. Nausea and vomiting, unspecified vomiting type Persistent symptoms with significant weight loss. She has lost her job as a result. Has stopped PPI -  recommend trying it again at bedtime. - Ambulatory referral to Gastroenterology  3. Abnormal weight loss Due to persistent vomiting and inability to eat. - Ambulatory referral to Gastroenterology  4. Left foot drop Being evaluated by [facility] - work up in progress   Partially dictated using Editor, commissioning. Any errors are unintentional.  Halina Maidens, MD [facility]  08/16/2021

## 2021-08-17 LAB — HEPATIC FUNCTION PANEL
ALT: 32 IU/L (ref 0–32)
AST: 97 IU/L — ABNORMAL HIGH (ref 0–40)
Albumin: 3.9 g/dL (ref 3.8–4.8)
Alkaline Phosphatase: 219 IU/L — ABNORMAL HIGH (ref 44–121)
Bilirubin Total: 2.4 mg/dL — ABNORMAL HIGH (ref 0.0–1.2)
Bilirubin, Direct: 1.14 mg/dL — ABNORMAL HIGH (ref 0.00–0.40)
Total Protein: 6.1 g/dL (ref 6.0–8.5)

## 2021-08-17 LAB — CERULOPLASMIN: Ceruloplasmin: 24.3 mg/dL (ref 19.0–39.0)

## 2021-08-17 LAB — MITOCHONDRIAL ANTIBODIES: Mitochondrial Ab: <20 Units (ref 0.0–20.0)

## 2021-08-17 LAB — IRON,TIBC AND FERRITIN PANEL
Ferritin: 1051 ng/mL — ABNORMAL HIGH (ref 15–150)
Iron Saturation: >92 % (ref 15–55)
Iron: 190 ug/dL — ABNORMAL HIGH (ref 27–159)
Total Iron Binding Capacity: <207 ug/dL — ABNORMAL LOW (ref 250–450)
UIBC: <17 ug/dL — ABNORMAL LOW (ref 131–425)

## 2021-08-17 LAB — ALPHA-1-ANTITRYPSIN: A-1 Antitrypsin: 174 mg/dL (ref 100–188)

## 2021-08-17 LAB — HEPATITIS B SURFACE ANTIBODY,QUALITATIVE: Hep B Surface Ab, Qual: NONREACTIVE

## 2021-08-17 LAB — ANTI-SMOOTH MUSCLE ANTIBODY, IGG: Smooth Muscle Ab: 6 Units (ref 0–19)

## 2021-08-17 LAB — HEPATITIS B SURFACE ANTIGEN: Hepatitis B Surface Ag: NEGATIVE

## 2021-08-17 LAB — HEPATITIS A ANTIBODY, TOTAL: hep A Total Ab: NEGATIVE

## 2021-08-27 ENCOUNTER — Other Ambulatory Visit: Payer: Self-pay

## 2021-08-27 DIAGNOSIS — R7989 Other specified abnormal findings of blood chemistry: Secondary | ICD-10-CM

## 2021-09-16 ENCOUNTER — Other Ambulatory Visit: Payer: Self-pay

## 2021-09-16 ENCOUNTER — Inpatient Hospital Stay
Admission: EM | Admit: 2021-09-16 | Discharge: 2021-09-18 | DRG: 378 | Discharge status: Against Medical Advice | Payer: PRIVATE HEALTH INSURANCE | Attending: Internal Medicine | Admitting: Internal Medicine

## 2021-09-16 ENCOUNTER — Emergency Department: Payer: PRIVATE HEALTH INSURANCE

## 2021-09-16 ENCOUNTER — Inpatient Hospital Stay: Payer: PRIVATE HEALTH INSURANCE

## 2021-09-16 DIAGNOSIS — D689 Coagulation defect, unspecified: Secondary | ICD-10-CM | POA: Diagnosis present

## 2021-09-16 DIAGNOSIS — T730XXA Starvation, initial encounter: Secondary | ICD-10-CM | POA: Diagnosis present

## 2021-09-16 DIAGNOSIS — K7010 Alcoholic hepatitis without ascites: Secondary | ICD-10-CM | POA: Diagnosis present

## 2021-09-16 DIAGNOSIS — G621 Alcoholic polyneuropathy: Secondary | ICD-10-CM | POA: Diagnosis present

## 2021-09-16 DIAGNOSIS — K648 Other hemorrhoids: Secondary | ICD-10-CM | POA: Diagnosis present

## 2021-09-16 DIAGNOSIS — K760 Fatty (change of) liver, not elsewhere classified: Secondary | ICD-10-CM | POA: Diagnosis present

## 2021-09-16 DIAGNOSIS — R791 Abnormal coagulation profile: Secondary | ICD-10-CM | POA: Diagnosis not present

## 2021-09-16 DIAGNOSIS — E876 Hypokalemia: Secondary | ICD-10-CM | POA: Diagnosis present

## 2021-09-16 DIAGNOSIS — F17200 Nicotine dependence, unspecified, uncomplicated: Secondary | ICD-10-CM | POA: Diagnosis present

## 2021-09-16 DIAGNOSIS — K635 Polyp of colon: Secondary | ICD-10-CM | POA: Diagnosis present

## 2021-09-16 DIAGNOSIS — K625 Hemorrhage of anus and rectum: Secondary | ICD-10-CM

## 2021-09-16 DIAGNOSIS — Z5329 Procedure and treatment not carried out because of patient's decision for other reasons: Secondary | ICD-10-CM | POA: Diagnosis present

## 2021-09-16 DIAGNOSIS — F10139 Alcohol abuse with withdrawal, unspecified: Secondary | ICD-10-CM | POA: Diagnosis present

## 2021-09-16 DIAGNOSIS — K644 Residual hemorrhoidal skin tags: Secondary | ICD-10-CM | POA: Diagnosis present

## 2021-09-16 DIAGNOSIS — K259 Gastric ulcer, unspecified as acute or chronic, without hemorrhage or perforation: Secondary | ICD-10-CM | POA: Diagnosis not present

## 2021-09-16 DIAGNOSIS — Z833 Family history of diabetes mellitus: Secondary | ICD-10-CM

## 2021-09-16 DIAGNOSIS — I10 Essential (primary) hypertension: Secondary | ICD-10-CM | POA: Diagnosis present

## 2021-09-16 DIAGNOSIS — D696 Thrombocytopenia, unspecified: Secondary | ICD-10-CM | POA: Diagnosis not present

## 2021-09-16 DIAGNOSIS — F1010 Alcohol abuse, uncomplicated: Secondary | ICD-10-CM | POA: Diagnosis present

## 2021-09-16 DIAGNOSIS — R7401 Elevation of levels of liver transaminase levels: Secondary | ICD-10-CM

## 2021-09-16 DIAGNOSIS — Z885 Allergy status to narcotic agent status: Secondary | ICD-10-CM

## 2021-09-16 DIAGNOSIS — Z8249 Family history of ischemic heart disease and other diseases of the circulatory system: Secondary | ICD-10-CM

## 2021-09-16 DIAGNOSIS — K254 Chronic or unspecified gastric ulcer with hemorrhage: Secondary | ICD-10-CM | POA: Diagnosis present

## 2021-09-16 DIAGNOSIS — Z20822 Contact with and (suspected) exposure to covid-19: Secondary | ICD-10-CM | POA: Diagnosis present

## 2021-09-16 DIAGNOSIS — F10188 Alcohol abuse with other alcohol-induced disorder: Secondary | ICD-10-CM | POA: Diagnosis present

## 2021-09-16 DIAGNOSIS — E538 Deficiency of other specified B group vitamins: Secondary | ICD-10-CM | POA: Diagnosis present

## 2021-09-16 DIAGNOSIS — F17210 Nicotine dependence, cigarettes, uncomplicated: Secondary | ICD-10-CM | POA: Diagnosis present

## 2021-09-16 DIAGNOSIS — W19XXXA Unspecified fall, initial encounter: Secondary | ICD-10-CM | POA: Diagnosis present

## 2021-09-16 DIAGNOSIS — R296 Repeated falls: Secondary | ICD-10-CM | POA: Diagnosis present

## 2021-09-16 DIAGNOSIS — K922 Gastrointestinal hemorrhage, unspecified: Secondary | ICD-10-CM | POA: Diagnosis not present

## 2021-09-16 DIAGNOSIS — Z560 Unemployment, unspecified: Secondary | ICD-10-CM

## 2021-09-16 DIAGNOSIS — K2920 Alcoholic gastritis without bleeding: Secondary | ICD-10-CM | POA: Diagnosis present

## 2021-09-16 DIAGNOSIS — R7989 Other specified abnormal findings of blood chemistry: Secondary | ICD-10-CM

## 2021-09-16 DIAGNOSIS — D62 Acute posthemorrhagic anemia: Secondary | ICD-10-CM | POA: Diagnosis present

## 2021-09-16 DIAGNOSIS — K319 Disease of stomach and duodenum, unspecified: Secondary | ICD-10-CM | POA: Diagnosis present

## 2021-09-16 DIAGNOSIS — I959 Hypotension, unspecified: Secondary | ICD-10-CM | POA: Diagnosis present

## 2021-09-16 DIAGNOSIS — K2921 Alcoholic gastritis with bleeding: Secondary | ICD-10-CM | POA: Diagnosis present

## 2021-09-16 DIAGNOSIS — E871 Hypo-osmolality and hyponatremia: Secondary | ICD-10-CM | POA: Diagnosis not present

## 2021-09-16 DIAGNOSIS — Z9851 Tubal ligation status: Secondary | ICD-10-CM

## 2021-09-16 DIAGNOSIS — E860 Dehydration: Secondary | ICD-10-CM | POA: Diagnosis present

## 2021-09-16 DIAGNOSIS — K219 Gastro-esophageal reflux disease without esophagitis: Secondary | ICD-10-CM | POA: Diagnosis present

## 2021-09-16 DIAGNOSIS — S0083XA Contusion of other part of head, initial encounter: Secondary | ICD-10-CM | POA: Diagnosis present

## 2021-09-16 HISTORY — DX: Acute posthemorrhagic anemia: D62

## 2021-09-16 LAB — COMPREHENSIVE METABOLIC PANEL
ALT: 50 U/L — ABNORMAL HIGH (ref 0–44)
AST: 167 U/L — ABNORMAL HIGH (ref 15–41)
Albumin: 2.5 g/dL — ABNORMAL LOW (ref 3.5–5.0)
Alkaline Phosphatase: 230 U/L — ABNORMAL HIGH (ref 38–126)
Anion gap: 10 (ref 5–15)
BUN: 8 mg/dL (ref 6–20)
CO2: 25 mmol/L (ref 22–32)
Calcium: 7.6 mg/dL — ABNORMAL LOW (ref 8.9–10.3)
Chloride: 101 mmol/L (ref 98–111)
Creatinine, Ser: 0.55 mg/dL (ref 0.44–1.00)
GFR, Estimated: >60 mL/min (ref >60)
Glucose, Bld: 80 mg/dL (ref 70–99)
Potassium: 3.7 mmol/L (ref 3.5–5.1)
Sodium: 136 mmol/L (ref 135–145)
Total Bilirubin: 0.7 mg/dL (ref 0.3–1.2)
Total Protein: 4.9 g/dL — ABNORMAL LOW (ref 6.5–8.1)

## 2021-09-16 LAB — HEMOGLOBIN AND HEMATOCRIT, BLOOD
HCT: 24.4 % — ABNORMAL LOW (ref 36.0–46.0)
Hemoglobin: 8.4 g/dL — ABNORMAL LOW (ref 12.0–15.0)

## 2021-09-16 LAB — MAGNESIUM: Magnesium: 1.7 mg/dL (ref 1.7–2.4)

## 2021-09-16 LAB — BRAIN NATRIURETIC PEPTIDE: B Natriuretic Peptide: 24.8 pg/mL (ref 0.0–100.0)

## 2021-09-16 LAB — LIPASE, BLOOD: Lipase: 30 U/L (ref 11–51)

## 2021-09-16 LAB — CBC
HCT: 17.6 % — ABNORMAL LOW (ref 36.0–46.0)
Hemoglobin: 5.9 g/dL — ABNORMAL LOW (ref 12.0–15.0)
MCH: 37.3 pg — ABNORMAL HIGH (ref 26.0–34.0)
MCHC: 33.5 g/dL (ref 30.0–36.0)
MCV: 111.4 fL — ABNORMAL HIGH (ref 80.0–100.0)
Platelets: 241 10*3/uL (ref 150–400)
RBC: 1.58 MIL/uL — ABNORMAL LOW (ref 3.87–5.11)
RDW: 15 % (ref 11.5–15.5)
WBC: 10.7 10*3/uL — ABNORMAL HIGH (ref 4.0–10.5)
nRBC: 0 % (ref 0.0–0.2)

## 2021-09-16 LAB — RESP PANEL BY RT-PCR (FLU A&B, COVID) ARPGX2
Influenza A by PCR: NEGATIVE
Influenza B by PCR: NEGATIVE
SARS Coronavirus 2 by RT PCR: NEGATIVE

## 2021-09-16 LAB — PROTIME-INR
INR: 1.1 (ref 0.8–1.2)
Prothrombin Time: 14.6 seconds (ref 11.4–15.2)

## 2021-09-16 LAB — AMMONIA: Ammonia: 19 umol/L (ref 9–35)

## 2021-09-16 LAB — ABO/RH: ABO/RH(D): A POS

## 2021-09-16 LAB — FOLATE: Folate: 3.9 ng/mL — ABNORMAL LOW (ref >5.9)

## 2021-09-16 MED ORDER — PANTOPRAZOLE 80MG IVPB - SIMPLE MED
80.0000 mg | Freq: Once | INTRAVENOUS | Status: AC
  Administered 2021-09-16: 80 mg via INTRAVENOUS
  Filled 2021-09-16: qty 100

## 2021-09-16 MED ORDER — CLONIDINE HCL 0.1 MG PO TABS
0.1000 mg | ORAL_TABLET | Freq: Once | ORAL | Status: AC
  Administered 2021-09-16: 0.1 mg via ORAL
  Filled 2021-09-16: qty 1

## 2021-09-16 MED ORDER — PANTOPRAZOLE INFUSION (NEW) - SIMPLE MED
8.0000 mg/h | INTRAVENOUS | Status: DC
  Administered 2021-09-16: 8 mg/h via INTRAVENOUS
  Filled 2021-09-16 (×2): qty 100

## 2021-09-16 MED ORDER — THIAMINE HCL 100 MG/ML IJ SOLN
100.0000 mg | Freq: Every day | INTRAMUSCULAR | Status: DC
  Administered 2021-09-16: 100 mg via INTRAVENOUS
  Filled 2021-09-16: qty 2

## 2021-09-16 MED ORDER — ACETAMINOPHEN 500 MG PO TABS: 500.0000 mg | ORAL_TABLET | Freq: Four times a day (QID) | ORAL | Status: DC | PRN

## 2021-09-16 MED ORDER — ONDANSETRON HCL 4 MG PO TABS: 4.0000 mg | ORAL_TABLET | Freq: Four times a day (QID) | ORAL | Status: DC | PRN

## 2021-09-16 MED ORDER — SODIUM CHLORIDE 0.9 % IV SOLN: INTRAVENOUS | Status: DC

## 2021-09-16 MED ORDER — LORAZEPAM 2 MG/ML IJ SOLN
1.0000 mg | INTRAMUSCULAR | Status: DC | PRN
  Administered 2021-09-16: 4 mg via INTRAVENOUS
  Administered 2021-09-16: 2 mg via INTRAVENOUS
  Filled 2021-09-16: qty 1
  Filled 2021-09-16: qty 2

## 2021-09-16 MED ORDER — FOLIC ACID 1 MG PO TABS
1.0000 mg | ORAL_TABLET | Freq: Every day | ORAL | Status: DC
  Administered 2021-09-16 – 2021-09-17 (×2): 1 mg via ORAL
  Filled 2021-09-16 (×2): qty 1

## 2021-09-16 MED ORDER — LORAZEPAM 1 MG PO TABS
1.0000 mg | ORAL_TABLET | ORAL | Status: DC | PRN
  Filled 2021-09-16: qty 1

## 2021-09-16 MED ORDER — NICOTINE 14 MG/24HR TD PT24
14.0000 mg | MEDICATED_PATCH | Freq: Every day | TRANSDERMAL | Status: DC
  Administered 2021-09-17 – 2021-09-18 (×2): 14 mg via TRANSDERMAL
  Filled 2021-09-16 (×2): qty 1

## 2021-09-16 MED ORDER — THIAMINE HCL 100 MG PO TABS
100.0000 mg | ORAL_TABLET | Freq: Every day | ORAL | Status: DC
  Administered 2021-09-17: 100 mg via ORAL
  Filled 2021-09-16: qty 1

## 2021-09-16 MED ORDER — SODIUM CHLORIDE 0.9 % IV SOLN
12.5000 mg | Freq: Once | INTRAVENOUS | Status: DC
  Filled 2021-09-16: qty 0.5

## 2021-09-16 MED ORDER — SODIUM CHLORIDE 0.9 % IV BOLUS
1000.0000 mL | Freq: Once | INTRAVENOUS | Status: AC
  Administered 2021-09-16: 1000 mL via INTRAVENOUS

## 2021-09-16 MED ORDER — SODIUM CHLORIDE 0.9 % IV SOLN
12.5000 mg | Freq: Once | INTRAVENOUS | Status: DC
  Filled 2021-09-16 (×2): qty 0.5

## 2021-09-16 MED ORDER — PANTOPRAZOLE SODIUM 40 MG IV SOLR: 40.0000 mg | Freq: Two times a day (BID) | INTRAVENOUS | Status: DC

## 2021-09-16 MED ORDER — SODIUM CHLORIDE 0.9 % IV SOLN: 10.0000 mL/h | Freq: Once | INTRAVENOUS | Status: DC

## 2021-09-16 MED ORDER — POLYETHYLENE GLYCOL 3350 17 GM/SCOOP PO POWD
1.0000 | Freq: Once | ORAL | Status: DC
  Filled 2021-09-16: qty 255

## 2021-09-16 MED ORDER — PANTOPRAZOLE SODIUM 40 MG IV SOLR
40.0000 mg | Freq: Two times a day (BID) | INTRAVENOUS | Status: DC
  Administered 2021-09-16 – 2021-09-17 (×3): 40 mg via INTRAVENOUS
  Filled 2021-09-16 (×2): qty 10
  Filled 2021-09-16: qty 40

## 2021-09-16 MED ORDER — ONDANSETRON HCL 4 MG/2ML IJ SOLN
4.0000 mg | Freq: Four times a day (QID) | INTRAMUSCULAR | Status: DC | PRN
  Administered 2021-09-16: 4 mg via INTRAVENOUS
  Filled 2021-09-16: qty 2

## 2021-09-16 MED ORDER — TIZANIDINE HCL 4 MG PO TABS
4.0000 mg | ORAL_TABLET | Freq: Four times a day (QID) | ORAL | Status: DC | PRN
  Administered 2021-09-16 – 2021-09-17 (×4): 4 mg via ORAL
  Filled 2021-09-16 (×2): qty 1
  Filled 2021-09-16: qty 2
  Filled 2021-09-16 (×2): qty 1

## 2021-09-16 MED ORDER — ADULT MULTIVITAMIN W/MINERALS CH
1.0000 | ORAL_TABLET | Freq: Every day | ORAL | Status: DC
  Administered 2021-09-16 – 2021-09-17 (×2): 1 via ORAL
  Filled 2021-09-16 (×2): qty 1

## 2021-09-16 NOTE — ED Provider Notes (Signed)
[facility] Provider Note    Event Date/Time   First MD Initiated Contact with Patient 09/16/21 0920     (approximate)   History   GI Problem and Weakness   HPI  Gabriela White is a 41 y.o. female with past medical history of HTN, chronic nausea and vomiting and GERD as well as EtOH abuse who presents for assessment of several complaints including about a week of bloody stools and worsening weakness as well as a fall that occurred last night.  Patient thinks she lost her balance because of worsening swelling in her legs and hit her head.  She denies being on any blood thinners and did not have LOC.  She denies any other acute pain from this fall.  States she typically drinks 3-4 alcoholic drinks per day but has not had any today.  No fevers, chest pain or cough but she endorses some chronic nausea and vomiting as well.  This is not any different over the last week.  Denies any urinary symptoms.  Denies any other illicit drug use.  States she is post to go to see [facility] GI doctor tomorrow was feeling too weak to even go for steps today.  I reviewed recent clinic visit from 1/6 patient saw her PCP.  On review of records it seems patient was supposed to go an endoscopy and colonoscopy with Dr. Landry Mellow but did not get preop LFTs done and did not get this done      Past Medical History:  Diagnosis Date   Hypertension     Past Surgical History:  Procedure Laterality Date   TONSILLECTOMY     TUBAL LIGATION       Physical Exam  Triage Vital Signs: ED Triage Vitals [09/16/21 0926]  Enc Vitals Group     BP 124/84     Pulse Rate (!) 123     Resp 18     Temp 99.3 F (37.4 C)     Temp Source Oral     SpO2 100 %     Weight 125 lb (56.7 kg)     Height 5' 3"  (1.6 m)     Head Circumference      Peak Flow      Pain Score 0     Pain Loc      Pain Edu?      Excl. in [place]?     Most recent vital signs: Vitals:   09/16/21 0943 09/16/21 1000  BP: 128/85 (!)  143/76  Pulse: (!) 131 (!) 138  Resp:  17  Temp:    SpO2:  100%    General: Awake, ill-appearing.  CV:  No murmurs but tachycardic with regular rhythm.  Prolonged capillary refill. Resp:  Normal effort.  Bilaterally Abd:  No distention.  Soft.  Some dried blood at the rectum without obvious source i.e. fissure or bleeding hemorrhoid Other:  Bilateral lower extremity pitting edema.  Hematoma over the left forehead without any other trauma to the face scalp head or neck.  No tenderness over the C/T/L-spine.  Cranials 2-12 are grossly intact.  No obvious trauma to the extremities which patient has full strength and range of motion throughout.   ED Results / Procedures / Treatments  Labs (all labs ordered are listed, but only abnormal results are displayed) Labs Reviewed  COMPREHENSIVE METABOLIC PANEL - Abnormal; Notable for the following components:      Result Value   Calcium 7.6 (*)  Total Protein 4.9 (*)    Albumin 2.5 (*)    AST 167 (*)    ALT 50 (*)    Alkaline Phosphatase 230 (*)    All other components within normal limits  CBC - Abnormal; Notable for the following components:   WBC 10.7 (*)    RBC 1.58 (*)    Hemoglobin 5.9 (*)    HCT 17.6 (*)    MCV 111.4 (*)    MCH 37.3 (*)    All other components within normal limits  RESP PANEL BY RT-PCR (FLU A&B, COVID) ARPGX2  LIPASE, BLOOD  MAGNESIUM  PROTIME-INR  AMMONIA  BRAIN NATRIURETIC PEPTIDE  TYPE AND SCREEN  PREPARE RBC (CROSSMATCH)  ABO/RH     EKG  ECG is remarkable for sinus tachycardia with a ventricular rate of 139, normal axis, unremarkable intervals without clearance of acute ischemia or significant arrhythmia.   RADIOLOGY  CT head interpreted by myself shows no evidence of skull fracture, intracranial hemorrhage, ischemia, mass effect, edema or other clear acute intracranial process.   PROCEDURES:  Critical Care performed: Yes, see critical care procedure note(s)  .Critical Care Performed  by: Lucrezia Starch, MD Authorized by: Lucrezia Starch, MD   Critical care provider statement:    Critical care time (minutes):  30   Critical care was necessary to treat or prevent imminent or life-threatening deterioration of the following conditions:  Circulatory failure   Critical care was time spent personally by me on the following activities:  Development of treatment plan with patient or surrogate, discussions with consultants, evaluation of patient's response to treatment, examination of patient, ordering and review of laboratory studies, ordering and review of radiographic studies, ordering and performing treatments and interventions, pulse oximetry, re-evaluation of patient's condition and review of old charts    MEDICATIONS ORDERED IN ED: Medications  pantoprazole (PROTONIX) 80 mg /NS 100 mL IVPB (has no administration in time range)  pantoprozole (PROTONIX) 80 mg /NS 100 mL infusion (has no administration in time range)  pantoprazole (PROTONIX) injection 40 mg (has no administration in time range)  LORazepam (ATIVAN) tablet 1-4 mg ( Oral See Alternative 09/16/21 1011)    Or  LORazepam (ATIVAN) injection 1-4 mg (2 mg Intravenous Given 09/16/21 1011)  thiamine tablet 100 mg ( Oral See Alternative 09/16/21 1011)    Or  thiamine (B-1) injection 100 mg (100 mg Intravenous Given 12/17/83 0277)  folic acid (FOLVITE) tablet 1 mg (1 mg Oral Given 09/16/21 1013)  multivitamin with minerals tablet 1 tablet (1 tablet Oral Given 09/16/21 1013)  0.9 %  sodium chloride infusion (has no administration in time range)  sodium chloride 0.9 % bolus 1,000 mL (1,000 mLs Intravenous New Bag/Given 09/16/21 0943)     IMPRESSION / MDM / [place] / ED COURSE  I reviewed the triage vital signs and the nursing notes.                              Differential diagnosis includes, but is not limited to fall yesterday secondary to syncope versus possible withdrawal seizure.  Other than a left-sided  forehead hematoma there is no other obvious evidence of trauma on exam and patient is otherwise neurovascular intact.  She is clinically sober.  She states her last drink was yesterday.  In addition I am concerned given her history and evidence of dried blood in the rectum which could have fallen due to acute  anemia, arrhythmia, metabolic derangement with lower suspicion for acute infectious process given absence of any acute infectious symptoms at this time.  ECG is remarkable for sinus tachycardia with a ventricular rate of 139, normal axis, unremarkable intervals without clearance of acute ischemia or significant arrhythmia.  CT head interpreted by myself shows no evidence of skull fracture, intracranial hemorrhage, ischemia, mass effect, edema or other clear acute intracranial process.  CBC is remarkable for hemoglobin of 5.9 compared to 7.7 on 2/2.  WBC count is 10.7.  Platelets are normal.  CMP is remarkable for albumin of 2.5 and AST of 167 and ALT of 50 with alk phos of 230.  This is similar to CMP obtained on 2/2 patient was seen at [facility] but left AMA.  I did review this record.  Lipase not consistent with acute pancreatitis.  Museum is within normal limits.  Ammonia and INR unremarkable.  Given AST 3 times ALT and concern for alcoholic hepatitis and gastritis possibly causing patient's acute anemia from GI bleed.  Patient started on Protonix.  I emergently consulted Dr. Marius Ditch agree with Protonix and PRBC administration.  2 units of PRBCs ordered.  She indicated no additional need for imaging of the abdomen pelvis at this time.  She recommend hospitalist admission and soft diet and being n.p.o. at midnight for possible endoscopy colonoscopy tomorrow.  Given patient is denying any other acute pain or symptoms I have low suspicion for other significant injury and I think she does not require more imaging at this time.  I will admit to medicine service for further evaluation and management with concern  for acute on chronic anemia from alcoholic gastritis and component of hepatitis.  Suspect her edema is from hypoalbuminemia.      FINAL CLINICAL IMPRESSION(S) / ED DIAGNOSES   Final diagnoses:  Acute alcoholic gastritis with hemorrhage  Fall, initial encounter  Traumatic hematoma of forehead, initial encounter  Transaminitis  Alcohol abuse     Rx / DC Orders   ED Discharge Orders     None        Note:  This document was prepared using Dragon voice recognition software and may include unintentional dictation errors.   Lucrezia Starch, MD 09/16/21 1043

## 2021-09-16 NOTE — ED Notes (Signed)
Secure msg sent to Faylene Kurtz, RN for ED to IP SBAR.

## 2021-09-16 NOTE — ED Notes (Signed)
This RN received report from Madelaine Bhat, Charity fundraiser.

## 2021-09-16 NOTE — Progress Notes (Incomplete)
eLink Physician-Brief Progress Note Patient Name: Gabriela White DOB: 06/10/1981 MRN: [id number]   Date of Service  09/16/2021  HPI/Events of Note  Brief HPI: H and P, Notes and labs reviewed.  Data: reviewed K 3.7, Cr normal Mag 1.7 ALP/AST/ALT: 230/167/50 Wbc 10.7, Hg 5.9 was 7.7 and > 11 a year ago. Plt 241K INR 1.1 Covid/flu neg CTH neg USG liver: fatty liver. EKG sinus tachy. Qtc 476. No acute ST changes.  Camera evaluation done: On room air, awake and alert. Stable vitals.  A/P: 41 yr old female with hx of HTN, alcohol abuser-dependence, , leg weakness-neurology evaluation done as an out patient with rectal bleeding and witnessed syncopal episode by her husband briefly. Admitted for    Symptomatic severe anemia from alcohol gastritis vs rectal hemorrhoids vs AVM vs diverticular . Folate deficiency. Hypomagnesemia  Elevated AST > AST from alcohol abuse hepatitis. No encephalopathy. Amonia normal.    eICU Interventions  - s/p Protonix 80 IV and on q12 hrly - transfuse  pRBC, follow Hg/hct. GI consultation.  - received thiamine, folate, fluids and on CIWA protocol - replace low mag if not done from ED earlier.  - aspiration, fall and sz precautions - SCD as VTE CBG goals < 180. Watch for hypoglycemia.      Intervention Category Intermediate Interventions: Bleeding - evaluation and treatment with blood products Evaluation Type: New Patient Evaluation  Elmer Sow 09/16/2021, 8:55 PM

## 2021-09-16 NOTE — ED Notes (Addendum)
Secure msg sent to Dr. Francine Graven re: pt's HR

## 2021-09-16 NOTE — ED Notes (Signed)
Secure msg sent to Dr. Joylene Igo re: pts request for home gabapentin.

## 2021-09-16 NOTE — ED Notes (Signed)
This RN let pt & family member know that pt is NPO & educated on what that means. Family member has food in the room for him & pt. Per family member, pt just vomited.

## 2021-09-16 NOTE — Consult Note (Signed)
Cephas Darby, MD [address]  [place], [place] [address]  Main: [phone]  Fax: [phone] Pager: [phone]   Consultation  Referring Provider:     No ref. provider found Primary Care Physician:  Glean Hess, MD Primary Gastroenterologist:  Dr. Lucilla Lame         Reason for Consultation:     Anemia, rectal bleeding  Date of Admission:  09/16/2021 Date of Consultation:  09/16/2021         HPI:   Gabriela White is a 41 y.o. female history of alcohol abuse, chronic tobacco use, history of alcoholic hepatitis presented with generalized weakness, rectal bleeding.  Patient noticed bright red blood per rectum, painless that started last night.  She denies any constipation or diarrhea.  She experienced few episodes about 5 days ago as well.  Patient is with her girlfriend who is bedside.  Patient denies any abdominal pain, nausea or vomiting.  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  Patient denies black stools.  She presented with severe tachycardia, generalized weakness, severe anemia, hemoglobin 5.9 on admission, dropped from 11.8 since 12/2020.  Normal platelets, normal BUN/creatinine.  PT/INR normal.  Her last drink of alcohol was yesterday.  LFTs are elevated alkaline phosphatase 230, albumin 2.5, AST 167, ALT 50, total bilirubin 0.7.  Patient underwent CT head without contrast which did not reveal any acute intra cranial pathology.  She underwent ultrasound liver Doppler which revealed fatty liver, no evidence of portal vein thrombosis or portal hypertension.  Patient is currently receiving first unit of PRBCs  NSAIDs: None  Antiplts/Anticoagulants/Anti thrombotics: None  GI Procedures: None Currently unemployed  Past Medical History:  Diagnosis Date   Hypertension     Past Surgical History:  Procedure Laterality Date   TONSILLECTOMY     TUBAL LIGATION      Prior to Admission medications   Medication Sig Start Date End Date Taking?  Authorizing Provider  celecoxib (CELEBREX) 100 MG capsule Take 100 mg by mouth 2 (two) times daily. [facility] Patient not taking: Reported on 08/16/2021 05/13/21   [provider]  gabapentin (NEURONTIN) 300 MG capsule Take 300 mg by mouth daily. [facility] Patient not taking: Reported on 08/16/2021 05/13/21   [provider]  ondansetron (ZOFRAN-ODT) 8 MG disintegrating tablet ondansetron 8 mg disintegrating tablet  ALLOW 1 TABLET TO DISSOLVE UNDER TONGUE UP TO 3 TIMES PER DAY FOR NAUSEA Patient not taking: Reported on 08/16/2021    [provider]  pantoprazole (PROTONIX) 40 MG tablet Take 1 tablet (40 mg total) by mouth daily. Patient not taking: Reported on 08/16/2021 06/26/21   Lucilla Lame, MD    Current Facility-Administered Medications:    0.9 %  sodium chloride infusion, 10 mL/hr, Intravenous, Once, Lucrezia Starch, MD   0.9 %  sodium chloride infusion, , Intravenous, Continuous, Agbata, Tochukwu, MD   0.9 %  sodium chloride infusion, , Intravenous, Continuous, Zanden Colver, Tally Due, MD   acetaminophen (TYLENOL) tablet 500-1,000 mg, 500-1,000 mg, Oral, Q6H PRN, Agbata, Tochukwu, MD   folic acid (FOLVITE) tablet 1 mg, 1 mg, Oral, Daily, Agbata, Tochukwu, MD, 1 mg at 09/16/21 1013   LORazepam (ATIVAN) tablet 1-4 mg, 1-4 mg, Oral, Q1H PRN **OR** LORazepam (ATIVAN) injection 1-4 mg, 1-4 mg, Intravenous, Q1H PRN, Agbata, Tochukwu, MD, 2 mg at 09/16/21 1011   multivitamin with minerals tablet 1 tablet, 1 tablet, Oral, Daily, Agbata, Tochukwu, MD, 1 tablet at 09/16/21 1013  ondansetron (ZOFRAN) tablet 4 mg, 4 mg, Oral, Q6H PRN **OR** ondansetron (ZOFRAN) injection 4 mg, 4 mg, Intravenous, Q6H PRN, Agbata, Tochukwu, MD, 4 mg at 09/16/21 1227   pantoprazole (PROTONIX) injection 40 mg, 40 mg, Intravenous, Q12H, Lurena Naeve, Tally Due, MD   polyethylene glycol powder (GLYCOLAX/MIRALAX) container 255 g, 1 Container, Oral, Once, Karam Dunson, Tally Due, MD   promethazine (PHENERGAN)  12.5 mg in sodium chloride 0.9 % 50 mL IVPB, 12.5 mg, Intravenous, Once, Beers, Brandon D, RPH   thiamine tablet 100 mg, 100 mg, Oral, Daily **OR** thiamine (B-1) injection 100 mg, 100 mg, Intravenous, Daily, Lucrezia Starch, MD, 100 mg at 09/16/21 1011   tiZANidine (ZANAFLEX) tablet 4 mg, 4 mg, Oral, Q6H PRN, Agbata, Tochukwu, MD, 4 mg at 09/16/21 1532  Current Outpatient Medications:    acetaminophen (TYLENOL) 500 MG tablet, Take 500-1,000 mg by mouth every 6 (six) hours as needed for mild pain or moderate pain., Disp: , Rfl:    ibuprofen (ADVIL) 200 MG tablet, Take 400-600 mg by mouth every 6 (six) hours as needed for mild pain or moderate pain., Disp: , Rfl:    tiZANidine (ZANAFLEX) 4 MG tablet, Take 4 mg by mouth every 6 (six) hours as needed for muscle spasms., Disp: , Rfl:    pantoprazole (PROTONIX) 40 MG tablet, Take 1 tablet (40 mg total) by mouth daily. (Patient not taking: Reported on 08/16/2021), Disp: 30 tablet, Rfl: 3  Family History  Problem Relation Age of Onset   Diabetes Maternal Grandmother    Heart disease Maternal Grandmother    Heart attack Maternal Grandmother    Heart disease Maternal Grandfather      Social History   Tobacco Use   Smoking status: Every Day    Packs/day: 0.25    Years: 20.00    Pack years: 5.00    Types: Cigarettes   Smokeless tobacco: Never  Vaping Use   Vaping Use: Never used  Substance Use Topics   Alcohol use: Yes    Alcohol/week: 6.0 - 9.0 standard drinks    Types: 6 - 9 Standard drinks or equivalent per week   Drug use: No    Allergies as of 09/16/2021 - Review Complete 09/16/2021  Allergen Reaction Noted   Morphine Nausea And Vomiting 08/06/2020    Review of Systems:    All systems reviewed and negative except where noted in HPI.   Physical Exam:  Vital signs in last 24 hours: Temp:  [98.4 F (36.9 C)-99.3 F (37.4 C)] 98.4 F (36.9 C) (02/06 1353) Pulse Rate:  [123-171] 171 (02/06 1522) Resp:  [16-25] 19 (02/06  1530) BP: (121-150)/(76-95) 136/95 (02/06 1530) SpO2:  [96 %-100 %] 99 % (02/06 1530) Weight:  [56.7 kg] 56.7 kg (02/06 0926)   General:   Pleasant, cooperative in NAD Head:  Normocephalic and atraumatic. Eyes:   No icterus.   Conjunctiva pale. PERRLA. Ears:  Normal auditory acuity. Neck:  Supple; no masses or thyroidomegaly Lungs: Respirations even and unlabored. Lungs clear to auscultation bilaterally.   No wheezes, crackles, or rhonchi.  Heart:  Regular rate and rhythm;  Without murmur, clicks, rubs or gallops Abdomen:  Soft, nondistended, nontender. Normal bowel sounds. No appreciable masses or hepatomegaly.  No rebound or guarding.  Rectal:  Not performed. Msk:  Symmetrical without gross deformities.  Strength generalized weakness Extremities:  Without edema, cyanosis or clubbing. Neurologic:  Alert and oriented x3;  grossly normal neurologically. Skin:  Intact without significant lesions or rashes. Psych:  Alert  and cooperative. Normal affect.  LAB RESULTS: CBC Latest Ref Rng & Units 09/16/2021 01/02/2021 08/10/2020  WBC 4.0 - 10.5 K/uL 10.7(H) 6.1 9.6  Hemoglobin 12.0 - 15.0 g/dL 5.9(L) 11.8 11.5(L)  Hematocrit 36.0 - 46.0 % 17.6(L) 34.2 33.0(L)  Platelets 150 - 400 K/uL 241 229 144(L)    BMET BMP Latest Ref Rng & Units 09/16/2021 01/02/2021 09/24/2020  Glucose 70 - 99 mg/dL 80 80 -  BUN 6 - 20 mg/dL 8 5(L) 5  Creatinine 0.44 - 1.00 mg/dL 0.55 0.67 0.5  BUN/Creat Ratio 9 - 23 - 7(L) -  Sodium 135 - 145 mmol/L 136 135 130(A)  Potassium 3.5 - 5.1 mmol/L 3.7 4.7 3.8  Chloride 98 - 111 mmol/L 101 96 95(A)  CO2 22 - 32 mmol/L 25 23 25(A)  Calcium 8.9 - 10.3 mg/dL 7.6(L) 10.1 9.6    LFT Hepatic Function Latest Ref Rng & Units 09/16/2021 08/16/2021 01/02/2021  Total Protein 6.5 - 8.1 g/dL 4.9(L) 6.1 7.5  Albumin 3.5 - 5.0 g/dL 2.5(L) 3.9 5.0(H)  AST 15 - 41 U/L 167(H) 97(H) 240(H)  ALT 0 - 44 U/L 50(H) 32 113(H)  Alk Phosphatase 38 - 126 U/L 230(H) 219(H) 94  Total Bilirubin 0.3 -  1.2 mg/dL 0.7 2.4(H) 0.4  Bilirubin, Direct 0.00 - 0.40 mg/dL - 1.14(H) -     STUDIES: CT HEAD WO CONTRAST (5MM)  Result Date: 09/16/2021 CLINICAL DATA:  Head trauma, poor oral intake 4 days EXAM: CT HEAD WITHOUT CONTRAST TECHNIQUE: Contiguous axial images were obtained from the base of the skull through the vertex without intravenous contrast. RADIATION DOSE REDUCTION: This exam was performed according to the departmental dose-optimization program which includes automated exposure control, adjustment of the mA and/or kV according to patient size and/or use of iterative reconstruction technique. COMPARISON:  None. FINDINGS: Brain: No evidence of acute infarction, hemorrhage, extra-axial collection, ventriculomegaly, or mass effect. Generalized cerebral atrophy. Periventricular white matter low attenuation likely secondary to microangiopathy. Vascular: No hyperdense vessel. No significant cerebrovascular atherosclerotic disease. Skull: Negative for fracture or focal lesion. Sinuses/Orbits: Visualized portions of the orbits are unremarkable. Visualized portions of the paranasal sinuses are unremarkable. Visualized portions of the mastoid air cells are unremarkable. Other: None. IMPRESSION: 1. No acute intracranial pathology. 2. Generalized cerebral atrophy and microangiopathy. Electronically Signed   By: Kathreen Devoid M.D.   On: 09/16/2021 10:30   US LIVER DOPPLER  Result Date: 09/16/2021 CLINICAL DATA:  Abnormal liver function tests EXAM: DUPLEX ULTRASOUND OF LIVER TECHNIQUE: Color and duplex Doppler ultrasound was performed to evaluate the hepatic in-flow and out-flow vessels. COMPARISON:  None. FINDINGS: Liver: There is increased echogenicity in the liver suggesting fatty infiltration. No focal abnormality is seen in the visualized portions of the liver. Normal hepatic contour without nodularity. No focal lesion, mass or intrahepatic biliary ductal dilatation. Main Portal Vein size: 1.04 cm Portal Vein  Velocities Main Prox:  58.5 cm/sec Main Mid: 53.9 cm/sec Main Dist:  51.7 cm/sec Right: 66.7 cm/sec Left: 41.3 cm/sec Hepatic Vein Velocities Right:  21 cm/sec Middle:  21.2 cm/sec Left:  40 cm/sec IVC: Present and patent with normal respiratory phasicity. Hepatic Artery Velocity:  226 cm/sec Splenic Vein Velocity:  26.9 cm/sec Spleen: 6.9 cm x 8.2 cm x 4 cm with a total volume of 116.1 cm^3 (411 cm^3 is upper limit normal) Portal Vein Occlusion/Thrombus: No Splenic Vein Occlusion/Thrombus: No Ascites: None Varices: None IMPRESSION: Fatty liver. Velocity measurements as described in the body of the report. Electronically Signed  By: Elmer Picker M.D.   On: 09/16/2021 15:01      Impression / Plan:   Gabriela White is a 41 y.o. female with history of alcohol abuse, chronic tobacco use, alcoholic hepatitis who presented with severe normocytic anemia, 1 week history of painless rectal bleeding, bright red blood  Acute blood loss anemia, rectal bleeding Patient does not have cirrhosis, normal BUN/creatinine Transfuse PRBCs to maintain hemoglobin above 7 Check B12 and folate levels Recommend colonoscopy for further evaluation, tentative plan for tomorrow if patient is able to tolerate the prep Okay with clear liquid diet today NPO effective 5 AM tomorrow Bowel prep ordered  Also, recommend upper endoscopy given history of alcohol abuse, rule out any peptic ulcer disease  I have discussed alternative options, risks & benefits,  which include, but are not limited to, bleeding, infection, perforation,respiratory complication & drug reaction.  The patient agrees with this plan & written consent will be obtained.     Alcohol abuse with alcoholic hepatitis No evidence of hyperbilirubinemia Recommend multivitamin, thiamine and folate daily Discussed with patient regarding high risk for acute alcoholic liver failure which is associated with increased mortality, progression to cirrhosis with  ongoing alcohol use CIWA protocol  Thank you for involving me in the care of this patient.  GI will follow along with you    LOS: 0 days   Sherri Sear, MD  09/16/2021, 3:52 PM    Note: This dictation was prepared with Dragon dictation along with smaller phrase technology. Any transcriptional errors that result from this process are unintentional.

## 2021-09-16 NOTE — ED Notes (Signed)
Secure msg sent to pharmacy requesting phenergan.

## 2021-09-16 NOTE — ED Notes (Signed)
This RN called the floor requesting a nurse be assigned to this pts room so report can be given.

## 2021-09-16 NOTE — ED Notes (Signed)
ED Provider at bedside. 

## 2021-09-16 NOTE — H&P (Addendum)
History and Physical    Patient: Gabriela White [id number] DOB: Nov 04, 1980 DOA: 09/16/2021 DOS: the patient was seen and examined on 09/16/2021 PCP: Glean Hess, MD  Patient coming from: Home  Chief Complaint:  Chief Complaint  Patient presents with   GI Problem   Weakness    HPI: Gabriela White is a 41 y.o. female with medical history significant hypertension, Alcohol abuse, nicotine dependence, GERD who presents to the emergency room for evaluation of weakness, lethargy and rectal bleeding. According to the patient and her husband she had an initial episode of passage of dark stools per rectum about a week ago.  This self resolved and she thought it was due to the cranberry juice that she drinks.  On the day of admission she had multiple episodes of passage of melena stools and had a witnessed " syncopal' episode by her husband.  She got up to use the bathroom and fell and had a transient loss of consciousness. Chart review shows that she was seen at [facility] for evaluation of rectal bleeding but left AGAINST MEDICAL ADVICE. She notes increased weakness, fatigue, bilateral lower extremity swelling and difficulty with ambulation over the last 1 week.  She also has a history of chronic nausea and emesis and has been following up with GI as an outpatient. Chart review shows that patient is being evaluated by neurology for worsening lower extremity weakness resulting in frequent falls and difficulty with ambulation. She denies having any abdominal pain, no chest pain, no shortness of breath, no dizziness, no lightheadedness, no urinary symptoms, no fever, no chills, no cough, no blurred vision no focal deficit. She denies NSAID use and denies having any hematemesis. Review of Systems: As mentioned in the history of present illness. All other systems reviewed and are negative. Past Medical History:  Diagnosis Date   Hypertension    Past Surgical History:   Procedure Laterality Date   TONSILLECTOMY     TUBAL LIGATION     Social History:  reports that she has been smoking cigarettes. She has a 5.00 pack-year smoking history. She has never used smokeless tobacco. She reports current alcohol use of about 6.0 - 9.0 standard drinks per week. She reports that she does not use drugs.  Allergies  Allergen Reactions   Morphine Nausea And Vomiting    Family History  Problem Relation Age of Onset   Diabetes Maternal Grandmother    Heart disease Maternal Grandmother    Heart attack Maternal Grandmother    Heart disease Maternal Grandfather     Prior to Admission medications   Medication Sig Start Date End Date Taking? Authorizing Provider  celecoxib (CELEBREX) 100 MG capsule Take 100 mg by mouth 2 (two) times daily. [facility] Patient not taking: Reported on 08/16/2021 05/13/21   [provider]  gabapentin (NEURONTIN) 300 MG capsule Take 300 mg by mouth daily. [facility] Patient not taking: Reported on 08/16/2021 05/13/21   [provider]  ondansetron (ZOFRAN-ODT) 8 MG disintegrating tablet ondansetron 8 mg disintegrating tablet  ALLOW 1 TABLET TO DISSOLVE UNDER TONGUE UP TO 3 TIMES PER DAY FOR NAUSEA Patient not taking: Reported on 08/16/2021    [provider]  pantoprazole (PROTONIX) 40 MG tablet Take 1 tablet (40 mg total) by mouth daily. Patient not taking: Reported on 08/16/2021 06/26/21   Lucilla Lame, MD    Physical Exam: Vitals:   09/16/21 0943 09/16/21 1000 09/16/21 1015 09/16/21 1100  BP: 128/85 (!) 143/76  127/83  Pulse: (!) 131 (!) 138 (!) 151 (!) 125  Resp:  17 18 17   Temp:      TempSrc:      SpO2:  100% 96% 100%  Weight:      Height:       Physical Exam Constitutional:      Appearance: Normal appearance. She is normal weight.  HENT:     Head: Normocephalic and atraumatic.     Nose: Nose normal.     Mouth/Throat:     Mouth: Mucous membranes are moist.  Eyes:     Comments: Pale conjunctiva   Cardiovascular:     Rate and Rhythm: Tachycardia present.     Pulses: Normal pulses.  Pulmonary:     Effort: Pulmonary effort is normal.  Abdominal:     General: Abdomen is flat. Bowel sounds are normal.     Palpations: Abdomen is soft.  Musculoskeletal:        General: Normal range of motion.     Cervical back: Normal range of motion and neck supple.  Skin:    General: Skin is warm and dry.  Neurological:     General: No focal deficit present.     Mental Status: She is alert.  Psychiatric:        Mood and Affect: Mood normal.        Behavior: Behavior normal.    Data Reviewed: Labs reviewed Noted to have a hemoglobin of 5.9 compared to baseline of 11.8 about a year ago.  Hemoglobin was 7.7 on 09/12/21 Calcium is 7.6 but corrects with a low albumin of 2.5 AST of 167 and ALT of 50 consistent with history of alcohol use Twelve-lead EKG reviewed by me shows sinus tachycardia There are no new results to review at this time.   Assessment and Plan Principal Problem:   Acute blood loss anemia (ABLA) Active Problems:   Essential hypertension   Tobacco use disorder   Alcohol abuse   Alcoholic gastritis     Acute blood loss anemia From a GI source Patient presents for evaluation of passage of multiple melena stools possibly related to alcoholic gastritis Hemoglobin on admission was 5.9 compared to baseline of 11 Patient was transfused 2 units of packed RBC in the ER Check serial H&H Consult gastroenterology Continue Protonix drip initiated in the ER      History of alcohol abuse with symptoms of alcohol withdrawal Place patient on CIWA protocol and administer lorazepam for CIWA score of 8 or greater Aggressive IV fluid hydration Place patient on MVI, thiamine and folic acid      Nicotine dependence Smoking cessation discussed with patient in detail. She declines a nicotine transdermal patch.     Bilateral lower extremity/frequent falls Check vitamin B12  levels Follow-up with neurology as an outpatient       Advance Care Planning:   Code Status: Not on file full code  Consults: Gastroenterology  Family Communication: Greater than 50% was spent discussing patient's condition and plan of care with her at the bedside.  All questions and concerns have been addressed.  She verbalizes understanding and agrees with the plan.  Severity of Illness: The appropriate patient status for this patient is INPATIENT. Inpatient status is judged to be reasonable and necessary in order to provide the required intensity of service to ensure the patient's safety. The patient's presenting symptoms, physical exam findings, and initial radiographic and laboratory data in the context of their chronic comorbidities is felt to place  them at high risk for further clinical deterioration. Furthermore, it is not anticipated that the patient will be medically stable for discharge from the hospital within 2 midnights of admission.   * I certify that at the point of admission it is my clinical judgment that the patient will require inpatient hospital care spanning beyond 2 midnights from the point of admission due to high intensity of service, high risk for further deterioration and high frequency of surveillance required.*  Author: Collier Bullock, MD 09/16/2021 11:34 AM  For on call review [email or web address].

## 2021-09-16 NOTE — ED Triage Notes (Addendum)
Patient to ER via [facility] from home with multiple complaints. Reports several days of generalized weakness. Reports poor oral intake x4 days. Also reports x2 episodes of bright red stool last night, states she started having bloody stools approx one week ago. Reports she also had a mechanical fall yesterday, hematoma present to left side of forehead/ eye. Denies LOC but unsure about circumstances of fall. Denies blood thinner usage.   Reports bilateral foot swelling x10 days, states her doctor took her off her gabapentin. Reports yesterday had some improvement in swelling and was able to put her shoes on for the first time.   Reports she has an appointment with GI at [facility] tomorrow d/t liver issues. Patient reports daily alcohol usage, yesterday reports around 3 shots and hasn't had anything to drink today. Reports no history of DT's/ withdrawals.   EMS VS- HR 130 ST, BP 150/80, cbg 92, afebrile.

## 2021-09-16 NOTE — ED Notes (Signed)
Covid swab sent to lab.

## 2021-09-16 NOTE — ED Notes (Signed)
Pt sitting up in [facility], per pt preference. She states this is more comfortable than the hospital bed.

## 2021-09-16 NOTE — ED Notes (Signed)
Patient transported to CT 

## 2021-09-16 NOTE — ED Notes (Signed)
Pt wants to go to her vehicle. This RN told pt that she cannot go outside/to her car with an IV in. Pt v/u. Pt asked if it is okay to walk around the unit to help with her foot pain. Pt ambulating in hallway with family at this time.

## 2021-09-16 NOTE — ED Notes (Signed)
Pt back to room.

## 2021-09-16 NOTE — ED Notes (Signed)
Repeat lavender tube sent to lab.

## 2021-09-16 NOTE — ED Notes (Signed)
Pt asleep on assessment. Pt easily arousable. Denis any nausea, HA, tremors, anxiety, or other withdrawal symptoms. Pt states she is shaking because she is cold. Pt provided with warm blankets. RN will continue to monitor.

## 2021-09-17 ENCOUNTER — Encounter: Payer: Self-pay | Admitting: Anesthesiology

## 2021-09-17 DIAGNOSIS — D62 Acute posthemorrhagic anemia: Secondary | ICD-10-CM | POA: Diagnosis not present

## 2021-09-17 DIAGNOSIS — F1010 Alcohol abuse, uncomplicated: Secondary | ICD-10-CM

## 2021-09-17 DIAGNOSIS — K7010 Alcoholic hepatitis without ascites: Secondary | ICD-10-CM | POA: Diagnosis not present

## 2021-09-17 DIAGNOSIS — K625 Hemorrhage of anus and rectum: Secondary | ICD-10-CM | POA: Diagnosis not present

## 2021-09-17 LAB — HEMOGLOBIN AND HEMATOCRIT, BLOOD
HCT: 23 % — ABNORMAL LOW (ref 36.0–46.0)
HCT: 28.1 % — ABNORMAL LOW (ref 36.0–46.0)
Hemoglobin: 7.9 g/dL — ABNORMAL LOW (ref 12.0–15.0)
Hemoglobin: 9.5 g/dL — ABNORMAL LOW (ref 12.0–15.0)

## 2021-09-17 LAB — BASIC METABOLIC PANEL
Anion gap: 7 (ref 5–15)
BUN: 8 mg/dL (ref 6–20)
CO2: 25 mmol/L (ref 22–32)
Calcium: 7.5 mg/dL — ABNORMAL LOW (ref 8.9–10.3)
Chloride: 99 mmol/L (ref 98–111)
Creatinine, Ser: 0.47 mg/dL (ref 0.44–1.00)
GFR, Estimated: >60 mL/min (ref >60)
Glucose, Bld: 82 mg/dL (ref 70–99)
Potassium: 3.3 mmol/L — ABNORMAL LOW (ref 3.5–5.1)
Sodium: 131 mmol/L — ABNORMAL LOW (ref 135–145)

## 2021-09-17 LAB — MAGNESIUM: Magnesium: 1.5 mg/dL — ABNORMAL LOW (ref 1.7–2.4)

## 2021-09-17 LAB — VITAMIN B12: Vitamin B-12: 1210 pg/mL — ABNORMAL HIGH (ref 180–914)

## 2021-09-17 LAB — HIV ANTIBODY (ROUTINE TESTING W REFLEX): HIV Screen 4th Generation wRfx: NONREACTIVE

## 2021-09-17 LAB — HCG, QUANTITATIVE, PREGNANCY: hCG, Beta Chain, Quant, S: <1 m[IU]/mL (ref <5)

## 2021-09-17 MED ORDER — POLYETHYLENE GLYCOL 3350 17 GM/SCOOP PO POWD
1.0000 | Freq: Once | ORAL | Status: AC
  Administered 2021-09-17: 255 g via ORAL
  Filled 2021-09-17 (×2): qty 255

## 2021-09-17 MED ORDER — POLYETHYLENE GLYCOL 3350 17 GM/SCOOP PO POWD
1.0000 | Freq: Once | ORAL | Status: AC
  Administered 2021-09-17: 255 g via ORAL
  Filled 2021-09-17: qty 255

## 2021-09-17 MED ORDER — POTASSIUM CHLORIDE CRYS ER 20 MEQ PO TBCR
80.0000 meq | EXTENDED_RELEASE_TABLET | Freq: Once | ORAL | Status: AC
  Administered 2021-09-17: 80 meq via ORAL
  Filled 2021-09-17: qty 4

## 2021-09-17 MED ORDER — MAGNESIUM SULFATE 2 GM/50ML IV SOLN
2.0000 g | Freq: Once | INTRAVENOUS | Status: AC
  Administered 2021-09-17: 2 g via INTRAVENOUS
  Filled 2021-09-17: qty 50

## 2021-09-17 MED ORDER — ACETAMINOPHEN 500 MG PO TABS: 500.0000 mg | ORAL_TABLET | Freq: Four times a day (QID) | ORAL | Status: DC | PRN

## 2021-09-17 NOTE — Progress Notes (Signed)
PROGRESS NOTE    Gabriela White  OJJ:[id number] DOB: [birth year] DOA: 09/16/2021 PCP: Reubin Milan, MD  Outpatient Specialists: GI    Brief Narrative:   History heavy drinking, htn, presenting with one week brb and maroon-colored stools. Presented to [facility] last week where endoluminal investigation was recommended but patient declined that and left.    Assessment & Plan:   Principal Problem:   Acute blood loss anemia (ABLA) Active Problems:   Essential hypertension   Tobacco use disorder   Alcohol abuse   Alcoholic gastritis  # GI bleeding # Acute blood loss anemia Hgb 11.8 eight months ago, 5.9 on presentation, now 7.9 after 2 units. Hemodynamically stable. Color of bleed suggests lower source, though hx of alcohol liver disease and CT last year showing signs possible gastric ulcer is concerning for possible upper source. Plan for endoluminal evaluation today but unfortunately appears patient was not prepped. - GI following - likely plan for prep tonight, endoluminal eval tomorrow - trend hgb - PPI IV BID  # HTN Here bp normotensive, is not currently on meds at home - monitor  # Neuropathy Says she only takes muscle relaxant for this - cont home tizanadine  # Heavy drinker # Alcohol liver disease U/s with steatosis. May be minimizing use. Hepatitis serologies negative. Denies hx withdrawal. - CIWA, MVI - UDS  # Hypokalemia 3.3 - will replete, f/u mg  # Tobacco abuse - patch   DVT prophylaxis: scds Code Status: full Family Communication: husband updated @ bedside  Level of care: Progressive Status is: Inpatient Remains inpatient appropriate because: severity of illness            Consultants:  GI  Procedures: none  Antimicrobials:  none    Subjective: Denies dyspnea, chest pain, fatigue  Objective: Vitals:   09/16/21 2040 09/16/21 2329 09/17/21 0543 09/17/21 0741  BP: (!) 100/59 105/67 106/75 113/77  Pulse: 94 84 97 (!) 115   Resp: 18 17 17    Temp: 98.7 F (37.1 C) 98.6 F (37 C) 98.6 F (37 C) 98.7 F (37.1 C)  TempSrc: Oral     SpO2: 100% 100% 99% 96%  Weight:      Height:        Intake/Output Summary (Last 24 hours) at 09/17/2021 0905 Last data filed at 09/16/2021 1224 Gross per 24 hour  Intake 1111.43 ml  Output --  Net 1111.43 ml   Filed Weights   09/16/21 0926  Weight: 56.7 kg    Examination:  General exam: Appears calm and comfortable  Respiratory system: Clear to auscultation. Respiratory effort normal. Cardiovascular system: S1 & S2 heard, RRR. No JVD, murmurs, rubs, gallops or clicks. No pedal edema. Gastrointestinal system: Abdomen is nondistended, soft and nontender. No organomegaly or masses felt. Normal bowel sounds heard. Central nervous system: Alert and oriented. No focal neurological deficits. Extremities: Symmetric 5 x 5 power. Skin: No rashes, lesions or ulcers Psychiatry: Judgement and insight appear normal. Mood & affect appropriate.     Data Reviewed: I have personally reviewed following labs and imaging studies  CBC: Recent Labs  Lab 09/16/21 0930 09/16/21 2203 09/17/21 0256  WBC 10.7*  --   --   HGB 5.9* 8.4* 7.9*  HCT 17.6* 24.4* 23.0*  MCV 111.4*  --   --   PLT 241  --   --    Basic Metabolic Panel: Recent Labs  Lab 09/16/21 0930 09/17/21 0256  NA 136 131*  K 3.7 3.3*  CL 101  99  CO2 25 25  GLUCOSE 80 82  BUN 8 8  CREATININE 0.55 0.47  CALCIUM 7.6* 7.5*  MG 1.7  --    GFR: Estimated Creatinine Clearance: 78.1 mL/min (by C-G formula based on SCr of 0.47 mg/dL). Liver Function Tests: Recent Labs  Lab 09/16/21 0930  AST 167*  ALT 50*  ALKPHOS 230*  BILITOT 0.7  PROT 4.9*  ALBUMIN 2.5*   Recent Labs  Lab 09/16/21 0930  LIPASE 30   Recent Labs  Lab 09/16/21 0937  AMMONIA 19   Coagulation Profile: Recent Labs  Lab 09/16/21 0930  INR 1.1   Cardiac Enzymes: No results for input(s): CKTOTAL, CKMB, CKMBINDEX, TROPONINI in the  last 168 hours. BNP (last 3 results) No results for input(s): PROBNP in the last 8760 hours. HbA1C: No results for input(s): HGBA1C in the last 72 hours. CBG: No results for input(s): GLUCAP in the last 168 hours. Lipid Profile: No results for input(s): CHOL, HDL, LDLCALC, TRIG, CHOLHDL, LDLDIRECT in the last 72 hours. Thyroid Function Tests: No results for input(s): TSH, T4TOTAL, FREET4, T3FREE, THYROIDAB in the last 72 hours. Anemia Panel: Recent Labs    09/16/21 0931  FOLATE 3.9*   Urine analysis:    Component Value Date/Time   COLORURINE STRAW (A) 12/11/2020 1536   APPEARANCEUR CLEAR 12/11/2020 1536   LABSPEC 1.010 12/11/2020 1536   PHURINE 7.0 12/11/2020 1536   GLUCOSEU NEGATIVE 12/11/2020 1536   HGBUR NEGATIVE 12/11/2020 1536   BILIRUBINUR NEGATIVE 12/11/2020 1536   KETONESUR NEGATIVE 12/11/2020 1536   PROTEINUR NEGATIVE 12/11/2020 1536   NITRITE NEGATIVE 12/11/2020 1536   LEUKOCYTESUR NEGATIVE 12/11/2020 1536   Sepsis Labs: @LABRCNTIP (procalcitonin:4,lacticidven:4)  ) Recent Results (from the past 240 hour(s))  Resp Panel by RT-PCR (Flu A&B, Covid) Nasopharyngeal Swab     Status: None   Collection Time: 09/16/21  9:37 AM   Specimen: Nasopharyngeal Swab; Nasopharyngeal(NP) swabs in vial transport medium  Result Value Ref Range Status   SARS Coronavirus 2 by RT PCR NEGATIVE NEGATIVE Final    Comment: (NOTE) SARS-CoV-2 target nucleic acids are NOT DETECTED.  The SARS-CoV-2 RNA is generally detectable in upper respiratory specimens during the acute phase of infection. The lowest concentration of SARS-CoV-2 viral copies this assay can detect is 138 copies/mL. A negative result does not preclude SARS-Cov-2 infection and should not be used as the sole basis for treatment or other patient management decisions. A negative result may occur with  improper specimen collection/handling, submission of specimen other than nasopharyngeal swab, presence of viral mutation(s)  within the areas targeted by this assay, and inadequate number of viral copies(<138 copies/mL). A negative result must be combined with clinical observations, patient history, and epidemiological information. The expected result is Negative.  Fact Sheet for Patients:  11/14/21  Fact Sheet for Healthcare Providers:  BloggerCourse.com  This test is no t yet approved or cleared by the [place] FDA and  has been authorized for detection and/or diagnosis of SARS-CoV-2 by FDA under an Emergency Use Authorization (EUA). This EUA will remain  in effect (meaning this test can be used) for the duration of the COVID-19 declaration under Section 564(b)(1) of the Act, 21 U.S.C.section 360bbb-3(b)(1), unless the authorization is terminated  or revoked sooner.       Influenza A by PCR NEGATIVE NEGATIVE Final   Influenza B by PCR NEGATIVE NEGATIVE Final    Comment: (NOTE) The Xpert Xpress SARS-CoV-2/FLU/RSV plus assay is intended as an aid in the diagnosis of  influenza from Nasopharyngeal swab specimens and should not be used as a sole basis for treatment. Nasal washings and aspirates are unacceptable for Xpert Xpress SARS-CoV-2/FLU/RSV testing.  Fact Sheet for Patients: BloggerCourse.comhttps://www.fda.gov/media/152166/download  Fact Sheet for Healthcare Providers: SeriousBroker.ithttps://www.fda.gov/media/152162/download  This test is not yet approved or cleared by the [place] FDA and has been authorized for detection and/or diagnosis of SARS-CoV-2 by FDA under an Emergency Use Authorization (EUA). This EUA will remain in effect (meaning this test can be used) for the duration of the COVID-19 declaration under Section 564(b)(1) of the Act, 21 U.S.C. section 360bbb-3(b)(1), unless the authorization is terminated or revoked.  Performed at [facility], [address] [place], [place] [address]          Radiology Studies: CT HEAD WO  CONTRAST (5MM)  Result Date: 09/16/2021 CLINICAL DATA:  Head trauma, poor oral intake 4 days EXAM: CT HEAD WITHOUT CONTRAST TECHNIQUE: Contiguous axial images were obtained from the base of the skull through the vertex without intravenous contrast. RADIATION DOSE REDUCTION: This exam was performed according to the departmental dose-optimization program which includes automated exposure control, adjustment of the mA and/or kV according to patient size and/or use of iterative reconstruction technique. COMPARISON:  None. FINDINGS: Brain: No evidence of acute infarction, hemorrhage, extra-axial collection, ventriculomegaly, or mass effect. Generalized cerebral atrophy. Periventricular white matter low attenuation likely secondary to microangiopathy. Vascular: No hyperdense vessel. No significant cerebrovascular atherosclerotic disease. Skull: Negative for fracture or focal lesion. Sinuses/Orbits: Visualized portions of the orbits are unremarkable. Visualized portions of the paranasal sinuses are unremarkable. Visualized portions of the mastoid air cells are unremarkable. Other: None. IMPRESSION: 1. No acute intracranial pathology. 2. Generalized cerebral atrophy and microangiopathy. Electronically Signed   By: Elige KoHetal  Patel M.D.   On: 09/16/2021 10:30   US LIVER DOPPLER  Result Date: 09/16/2021 CLINICAL DATA:  Abnormal liver function tests EXAM: DUPLEX ULTRASOUND OF LIVER TECHNIQUE: Color and duplex Doppler ultrasound was performed to evaluate the hepatic in-flow and out-flow vessels. COMPARISON:  None. FINDINGS: Liver: There is increased echogenicity in the liver suggesting fatty infiltration. No focal abnormality is seen in the visualized portions of the liver. Normal hepatic contour without nodularity. No focal lesion, mass or intrahepatic biliary ductal dilatation. Main Portal Vein size: 1.04 cm Portal Vein Velocities Main Prox:  58.5 cm/sec Main Mid: 53.9 cm/sec Main Dist:  51.7 cm/sec Right: 66.7 cm/sec Left:  41.3 cm/sec Hepatic Vein Velocities Right:  21 cm/sec Middle:  21.2 cm/sec Left:  40 cm/sec IVC: Present and patent with normal respiratory phasicity. Hepatic Artery Velocity:  226 cm/sec Splenic Vein Velocity:  26.9 cm/sec Spleen: 6.9 cm x 8.2 cm x 4 cm with a total volume of 116.1 cm^3 (411 cm^3 is upper limit normal) Portal Vein Occlusion/Thrombus: No Splenic Vein Occlusion/Thrombus: No Ascites: None Varices: None IMPRESSION: Fatty liver. Velocity measurements as described in the body of the report. Electronically Signed   By: Ernie AvenaPalani  Rathinasamy M.D.   On: 09/16/2021 15:01        Scheduled Meds:  folic acid  1 mg Oral Daily   multivitamin with minerals  1 tablet Oral Daily   nicotine  14 mg Transdermal Q0600   pantoprazole  40 mg Intravenous Q12H   polyethylene glycol powder  1 Container Oral Once   thiamine  100 mg Oral Daily   Or   thiamine  100 mg Intravenous Daily   Continuous Infusions:  sodium chloride     sodium chloride  sodium chloride     promethazine (PHENERGAN) injection (IM or IVPB)       LOS: 1 day    Time spent: 45 min    Silvano Bilis, MD [facility]   If 7PM-7AM, please contact night-coverage [email or web address] Password [email or web address] 09/17/2021, 9:05 AM

## 2021-09-17 NOTE — Progress Notes (Signed)
Cephas Darby, MD [address]  [place]  [place], [place] [address]  Main: [phone]  Fax: [phone] Pager: [phone]   Subjective: Patient did not receive bowel prep last night, therefore she did not undergo EGD and colonoscopy today.  Patient reports that she is still having liquid brown bowel movements.  She finished 1 dose of MiraLAX prep.  Her husband is bedside.  Patient denies any abdominal pain, nausea or vomiting.  She is no longer experiencing rectal bleeding.  She is upset that she did not receive her prep yesterday.  She wants to go home tomorrow after the procedures.  She is willing to have another bowel prep tonight since her stools are not clear   Objective: Vital signs in last 24 hours: Vitals:   09/16/21 2329 09/17/21 0543 09/17/21 0741 09/17/21 1156  BP: 105/67 106/75 113/77 113/76  Pulse: 84 97 (!) 115 78  Resp: 17 17  16   Temp: 98.6 F (37 C) 98.6 F (37 C) 98.7 F (37.1 C) 98 F (36.7 C)  TempSrc:      SpO2: 100% 99% 96% 100%  Weight:      Height:       Weight change:   Intake/Output Summary (Last 24 hours) at 09/17/2021 1700 Last data filed at 09/17/2021 1408 Gross per 24 hour  Intake 240 ml  Output --  Net 240 ml     Exam: Heart:: Regular rate and rhythm or S1S2 present Lungs: normal and clear to auscultation Abdomen: soft, nontender, normal bowel sounds   Lab Results: CBC Latest Ref Rng & Units 09/17/2021 09/17/2021 09/16/2021  WBC 4.0 - 10.5 K/uL - - -  Hemoglobin 12.0 - 15.0 g/dL 9.5(L) 7.9(L) 8.4(L)  Hematocrit 36.0 - 46.0 % 28.1(L) 23.0(L) 24.4(L)  Platelets 150 - 400 K/uL - - -   CMP Latest Ref Rng & Units 09/17/2021 09/16/2021 08/16/2021  Glucose 70 - 99 mg/dL 82 80 -  BUN 6 - 20 mg/dL 8 8 -  Creatinine 0.44 - 1.00 mg/dL 0.47 0.55 -  Sodium 135 - 145 mmol/L 131(L) 136 -  Potassium 3.5 - 5.1 mmol/L 3.3(L) 3.7 -  Chloride 98 - 111 mmol/L 99 101 -  CO2 22 - 32 mmol/L 25 25 -  Calcium 8.9 - 10.3 mg/dL 7.5(L) 7.6(L) -   Total Protein 6.5 - 8.1 g/dL - 4.9(L) 6.1  Total Bilirubin 0.3 - 1.2 mg/dL - 0.7 2.4(H)  Alkaline Phos 38 - 126 U/L - 230(H) 219(H)  AST 15 - 41 U/L - 167(H) 97(H)  ALT 0 - 44 U/L - 50(H) 32   Iron/TIBC/Ferritin/ %Sat    Component Value Date/Time   IRON 190 (H) 08/16/2021 0949   TIBC <207 (L) 08/16/2021 0949   FERRITIN 1,051 (H) 08/16/2021 0949   IRONPCTSAT >92 ([place]) 08/16/2021 0949    Micro Results: Recent Results (from the past 240 hour(s))  Resp Panel by RT-PCR (Flu A&B, Covid) Nasopharyngeal Swab     Status: None   Collection Time: 09/16/21  9:37 AM   Specimen: Nasopharyngeal Swab; Nasopharyngeal(NP) swabs in vial transport medium  Result Value Ref Range Status   SARS Coronavirus 2 by RT PCR NEGATIVE NEGATIVE Final    Comment: (NOTE) SARS-CoV-2 target nucleic acids are NOT DETECTED.  The SARS-CoV-2 RNA is generally detectable in upper respiratory specimens during the acute phase of infection. The lowest concentration of SARS-CoV-2 viral copies this assay can detect is 138 copies/mL. A negative result does not preclude SARS-Cov-2 infection  and should not be used as the sole basis for treatment or other patient management decisions. A negative result may occur with  improper specimen collection/handling, submission of specimen other than nasopharyngeal swab, presence of viral mutation(s) within the areas targeted by this assay, and inadequate number of viral copies(<138 copies/mL). A negative result must be combined with clinical observations, patient history, and epidemiological information. The expected result is Negative.  Fact Sheet for Patients:  EntrepreneurPulse.com.au  Fact Sheet for Healthcare Providers:  IncredibleEmployment.be  This test is no t yet approved or cleared by the [place] FDA and  has been authorized for detection and/or diagnosis of SARS-CoV-2 by FDA under an Emergency Use Authorization (EUA). This EUA  will remain  in effect (meaning this test can be used) for the duration of the COVID-19 declaration under Section 564(b)(1) of the Act, 21 U.S.C.section 360bbb-3(b)(1), unless the authorization is terminated  or revoked sooner.       Influenza A by PCR NEGATIVE NEGATIVE Final   Influenza B by PCR NEGATIVE NEGATIVE Final    Comment: (NOTE) The Xpert Xpress SARS-CoV-2/FLU/RSV plus assay is intended as an aid in the diagnosis of influenza from Nasopharyngeal swab specimens and should not be used as a sole basis for treatment. Nasal washings and aspirates are unacceptable for Xpert Xpress SARS-CoV-2/FLU/RSV testing.  Fact Sheet for Patients: EntrepreneurPulse.com.au  Fact Sheet for Healthcare Providers: IncredibleEmployment.be  This test is not yet approved or cleared by the [place] FDA and has been authorized for detection and/or diagnosis of SARS-CoV-2 by FDA under an Emergency Use Authorization (EUA). This EUA will remain in effect (meaning this test can be used) for the duration of the COVID-19 declaration under Section 564(b)(1) of the Act, 21 U.S.C. section 360bbb-3(b)(1), unless the authorization is terminated or revoked.  Performed at [facility], [address] [place], [place] [address]    Studies/Results: CT HEAD WO CONTRAST (5MM)  Result Date: 09/16/2021 CLINICAL DATA:  Head trauma, poor oral intake 4 days EXAM: CT HEAD WITHOUT CONTRAST TECHNIQUE: Contiguous axial images were obtained from the base of the skull through the vertex without intravenous contrast. RADIATION DOSE REDUCTION: This exam was performed according to the departmental dose-optimization program which includes automated exposure control, adjustment of the mA and/or kV according to patient size and/or use of iterative reconstruction technique. COMPARISON:  None. FINDINGS: Brain: No evidence of acute infarction, hemorrhage, extra-axial collection,  ventriculomegaly, or mass effect. Generalized cerebral atrophy. Periventricular white matter low attenuation likely secondary to microangiopathy. Vascular: No hyperdense vessel. No significant cerebrovascular atherosclerotic disease. Skull: Negative for fracture or focal lesion. Sinuses/Orbits: Visualized portions of the orbits are unremarkable. Visualized portions of the paranasal sinuses are unremarkable. Visualized portions of the mastoid air cells are unremarkable. Other: None. IMPRESSION: 1. No acute intracranial pathology. 2. Generalized cerebral atrophy and microangiopathy. Electronically Signed   By: Kathreen Devoid M.D.   On: 09/16/2021 10:30   US LIVER DOPPLER  Result Date: 09/16/2021 CLINICAL DATA:  Abnormal liver function tests EXAM: DUPLEX ULTRASOUND OF LIVER TECHNIQUE: Color and duplex Doppler ultrasound was performed to evaluate the hepatic in-flow and out-flow vessels. COMPARISON:  None. FINDINGS: Liver: There is increased echogenicity in the liver suggesting fatty infiltration. No focal abnormality is seen in the visualized portions of the liver. Normal hepatic contour without nodularity. No focal lesion, mass or intrahepatic biliary ductal dilatation. Main Portal Vein size: 1.04 cm Portal Vein Velocities Main Prox:  58.5 cm/sec Main Mid: 53.9 cm/sec Main Dist:  51.7  cm/sec Right: 66.7 cm/sec Left: 41.3 cm/sec Hepatic Vein Velocities Right:  21 cm/sec Middle:  21.2 cm/sec Left:  40 cm/sec IVC: Present and patent with normal respiratory phasicity. Hepatic Artery Velocity:  226 cm/sec Splenic Vein Velocity:  26.9 cm/sec Spleen: 6.9 cm x 8.2 cm x 4 cm with a total volume of 116.1 cm^3 (411 cm^3 is upper limit normal) Portal Vein Occlusion/Thrombus: No Splenic Vein Occlusion/Thrombus: No Ascites: None Varices: None IMPRESSION: Fatty liver. Velocity measurements as described in the body of the report. Electronically Signed   By: Elmer Picker M.D.   On: 09/16/2021 15:01   Medications: I have  reviewed the patient's current medications. Prior to Admission:  Medications Prior to Admission  Medication Sig Dispense Refill Last Dose   acetaminophen (TYLENOL) 500 MG tablet Take 500-1,000 mg by mouth every 6 (six) hours as needed for mild pain or moderate pain.   Unknown at PRN   ibuprofen (ADVIL) 200 MG tablet Take 400-600 mg by mouth every 6 (six) hours as needed for mild pain or moderate pain.   Unknown at PRN   tiZANidine (ZANAFLEX) 4 MG tablet Take 4 mg by mouth every 6 (six) hours as needed for muscle spasms.   Unknown at PRN   pantoprazole (PROTONIX) 40 MG tablet Take 1 tablet (40 mg total) by mouth daily. (Patient not taking: Reported on 08/16/2021) 30 tablet 3 Not Taking   Scheduled:  folic acid  1 mg Oral Daily   multivitamin with minerals  1 tablet Oral Daily   nicotine  14 mg Transdermal Q0600   pantoprazole  40 mg Intravenous Q12H   polyethylene glycol powder  1 Container Oral Once   thiamine  100 mg Oral Daily   Or   thiamine  100 mg Intravenous Daily   Continuous:  sodium chloride     sodium chloride     magnesium sulfate bolus IVPB     [id number], LORazepam **OR** LORazepam, ondansetron **OR** ondansetron (ZOFRAN) IV, tiZANidine Anti-infectives (From admission, onward)    None      Scheduled Meds:  folic acid  1 mg Oral Daily   multivitamin with minerals  1 tablet Oral Daily   nicotine  14 mg Transdermal Q0600   pantoprazole  40 mg Intravenous Q12H   thiamine  100 mg Oral Daily   Or   thiamine  100 mg Intravenous Daily   Continuous Infusions:  sodium chloride     sodium chloride     magnesium sulfate bolus IVPB     PRN Meds:.acetaminophen, LORazepam **OR** LORazepam, ondansetron **OR** ondansetron (ZOFRAN) IV, tiZANidine   Assessment: Principal Problem:   Acute blood loss anemia (ABLA) Active Problems:   Essential hypertension   Tobacco use disorder   Alcohol abuse   Alcoholic gastritis  Plan: Rectal bleeding has stopped Hemoglobin  is stable and responded appropriately to blood transfusion No evidence of iron or B12 deficiency Folate deficiency, started on folic acid 1 mg daily Continue clear liquid diet today Repeat MiraLAX bowel prep tonight N.p.o. 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

## 2021-09-17 NOTE — Progress Notes (Addendum)
Multiple attempts to get urine, however each sample has been mixed with stool.  Patient also disposing of sample before I could see it.  Asked patient to notify us prior to disposal so we can see it.

## 2021-09-17 NOTE — Progress Notes (Addendum)
Patient's heart rate jumped up into the 140-150s while ambulating with husband off unit.  Once patient rested, HR improved to upper 80 to 90s.   Patient denied any symptoms (chest pain, dizziness, Palpitations, or SOB)  MD made aware

## 2021-09-17 NOTE — Progress Notes (Signed)
Patient had a 9 beat run of VT.  Went to assess patient, she was getting up to go to bathroom.  She had removed her heart monitor.  Told patient we need to leave it on so we can monitor HR/rhythm while up moving.    Patient denied any symptoms (Chest pain, SOB, dizziness/lightheadedness).    Message MD

## 2021-09-17 NOTE — Progress Notes (Signed)
Received call from telemetry that pt was off the monitor. Arrived to pt room with visitor and pt in the bathroom, pt left monitor on the bed. Informed pt that monitor must stay on as her rate is increasing with ambulation. Pt and visitor have agreed to comply.   Pt denies any symptoms at this time.

## 2021-09-17 NOTE — Progress Notes (Signed)
PT not in room and found to be outside the hospital with visitor. Security has escorted pt and visitor back to room at this time.   PT and visitor given hospital policy and regulations for patients leaving. PT informed that while she has IV access she will not be permitted to leave the premises. Visitor is free to come and go. Both pt and visitor acknowledge and understand.   PT denies any drug use at this time but does report being a current smoker. On call NP contacted for nicotine patch. Will continue to monitor.

## 2021-09-18 ENCOUNTER — Other Ambulatory Visit: Payer: Self-pay

## 2021-09-18 ENCOUNTER — Ambulatory Visit: Admit: 2021-09-18 | Payer: PRIVATE HEALTH INSURANCE | Admitting: Gastroenterology

## 2021-09-18 ENCOUNTER — Inpatient Hospital Stay (HOSPITAL_COMMUNITY)
Admission: EM | Admit: 2021-09-18 | Discharge: 2021-09-19 | Discharge status: Against Medical Advice | Payer: PRIVATE HEALTH INSURANCE | Source: Home / Self Care | Attending: Internal Medicine | Admitting: Internal Medicine

## 2021-09-18 ENCOUNTER — Inpatient Hospital Stay: Payer: PRIVATE HEALTH INSURANCE

## 2021-09-18 ENCOUNTER — Emergency Department: Payer: PRIVATE HEALTH INSURANCE

## 2021-09-18 ENCOUNTER — Encounter: Payer: Self-pay | Admitting: Emergency Medicine

## 2021-09-18 ENCOUNTER — Encounter
Admission: EM | Discharge status: Against Medical Advice | Payer: Self-pay | Source: Home / Self Care | Attending: Obstetrics and Gynecology

## 2021-09-18 DIAGNOSIS — Z833 Family history of diabetes mellitus: Secondary | ICD-10-CM

## 2021-09-18 DIAGNOSIS — R791 Abnormal coagulation profile: Secondary | ICD-10-CM

## 2021-09-18 DIAGNOSIS — Z885 Allergy status to narcotic agent status: Secondary | ICD-10-CM

## 2021-09-18 DIAGNOSIS — Z9851 Tubal ligation status: Secondary | ICD-10-CM

## 2021-09-18 DIAGNOSIS — K648 Other hemorrhoids: Secondary | ICD-10-CM | POA: Diagnosis present

## 2021-09-18 DIAGNOSIS — D62 Acute posthemorrhagic anemia: Secondary | ICD-10-CM

## 2021-09-18 DIAGNOSIS — I10 Essential (primary) hypertension: Secondary | ICD-10-CM | POA: Diagnosis present

## 2021-09-18 DIAGNOSIS — D696 Thrombocytopenia, unspecified: Secondary | ICD-10-CM | POA: Diagnosis present

## 2021-09-18 DIAGNOSIS — K219 Gastro-esophageal reflux disease without esophagitis: Secondary | ICD-10-CM | POA: Diagnosis present

## 2021-09-18 DIAGNOSIS — F1010 Alcohol abuse, uncomplicated: Secondary | ICD-10-CM | POA: Diagnosis not present

## 2021-09-18 DIAGNOSIS — R7989 Other specified abnormal findings of blood chemistry: Secondary | ICD-10-CM | POA: Diagnosis present

## 2021-09-18 DIAGNOSIS — I959 Hypotension, unspecified: Secondary | ICD-10-CM | POA: Diagnosis present

## 2021-09-18 DIAGNOSIS — K635 Polyp of colon: Secondary | ICD-10-CM

## 2021-09-18 DIAGNOSIS — E871 Hypo-osmolality and hyponatremia: Secondary | ICD-10-CM | POA: Diagnosis present

## 2021-09-18 DIAGNOSIS — K760 Fatty (change of) liver, not elsewhere classified: Secondary | ICD-10-CM | POA: Diagnosis present

## 2021-09-18 DIAGNOSIS — F17200 Nicotine dependence, unspecified, uncomplicated: Secondary | ICD-10-CM | POA: Diagnosis present

## 2021-09-18 DIAGNOSIS — Z8249 Family history of ischemic heart disease and other diseases of the circulatory system: Secondary | ICD-10-CM

## 2021-09-18 DIAGNOSIS — K644 Residual hemorrhoidal skin tags: Secondary | ICD-10-CM | POA: Diagnosis present

## 2021-09-18 DIAGNOSIS — K254 Chronic or unspecified gastric ulcer with hemorrhage: Secondary | ICD-10-CM | POA: Diagnosis present

## 2021-09-18 DIAGNOSIS — T730XXA Starvation, initial encounter: Secondary | ICD-10-CM | POA: Diagnosis present

## 2021-09-18 DIAGNOSIS — R000 Tachycardia, unspecified: Secondary | ICD-10-CM | POA: Diagnosis present

## 2021-09-18 DIAGNOSIS — D689 Coagulation defect, unspecified: Secondary | ICD-10-CM | POA: Diagnosis present

## 2021-09-18 DIAGNOSIS — Z20822 Contact with and (suspected) exposure to covid-19: Secondary | ICD-10-CM | POA: Diagnosis present

## 2021-09-18 DIAGNOSIS — Z5329 Procedure and treatment not carried out because of patient's decision for other reasons: Secondary | ICD-10-CM | POA: Diagnosis not present

## 2021-09-18 DIAGNOSIS — E860 Dehydration: Secondary | ICD-10-CM | POA: Diagnosis present

## 2021-09-18 DIAGNOSIS — K922 Gastrointestinal hemorrhage, unspecified: Secondary | ICD-10-CM | POA: Diagnosis not present

## 2021-09-18 DIAGNOSIS — K259 Gastric ulcer, unspecified as acute or chronic, without hemorrhage or perforation: Secondary | ICD-10-CM

## 2021-09-18 DIAGNOSIS — F17210 Nicotine dependence, cigarettes, uncomplicated: Secondary | ICD-10-CM | POA: Diagnosis present

## 2021-09-18 DIAGNOSIS — K319 Disease of stomach and duodenum, unspecified: Secondary | ICD-10-CM | POA: Diagnosis present

## 2021-09-18 DIAGNOSIS — I81 Portal vein thrombosis: Secondary | ICD-10-CM | POA: Diagnosis present

## 2021-09-18 DIAGNOSIS — K7010 Alcoholic hepatitis without ascites: Secondary | ICD-10-CM | POA: Diagnosis present

## 2021-09-18 LAB — CBC
HCT: 24.6 % — ABNORMAL LOW (ref 36.0–46.0)
HCT: 30.5 % — ABNORMAL LOW (ref 36.0–46.0)
HCT: 25.5 % — ABNORMAL LOW (ref 36.0–46.0)
HCT: 22.9 % — ABNORMAL LOW (ref 36.0–46.0)
Hemoglobin: 8.1 g/dL — ABNORMAL LOW (ref 12.0–15.0)
Hemoglobin: 10.1 g/dL — ABNORMAL LOW (ref 12.0–15.0)
Hemoglobin: 8.7 g/dL — ABNORMAL LOW (ref 12.0–15.0)
Hemoglobin: 7.9 g/dL — ABNORMAL LOW (ref 12.0–15.0)
MCH: 31.5 pg (ref 26.0–34.0)
MCH: 30.6 pg (ref 26.0–34.0)
MCH: 31.1 pg (ref 26.0–34.0)
MCH: 30.5 pg (ref 26.0–34.0)
MCHC: 32.9 g/dL (ref 30.0–36.0)
MCHC: 33.1 g/dL (ref 30.0–36.0)
MCHC: 34.1 g/dL (ref 30.0–36.0)
MCHC: 34.5 g/dL (ref 30.0–36.0)
MCV: 95.7 fL (ref 80.0–100.0)
MCV: 92.4 fL (ref 80.0–100.0)
MCV: 91.1 fL (ref 80.0–100.0)
MCV: 88.4 fL (ref 80.0–100.0)
Platelets: 137 10*3/uL — ABNORMAL LOW (ref 150–400)
Platelets: 121 10*3/uL — ABNORMAL LOW (ref 150–400)
Platelets: 29 10*3/uL — CL (ref 150–400)
Platelets: 117 10*3/uL — ABNORMAL LOW (ref 150–400)
RBC: 2.57 MIL/uL — ABNORMAL LOW (ref 3.87–5.11)
RBC: 3.3 MIL/uL — ABNORMAL LOW (ref 3.87–5.11)
RBC: 2.8 MIL/uL — ABNORMAL LOW (ref 3.87–5.11)
RBC: 2.59 MIL/uL — ABNORMAL LOW (ref 3.87–5.11)
RDW: 21.8 % — ABNORMAL HIGH (ref 11.5–15.5)
RDW: 19.6 % — ABNORMAL HIGH (ref 11.5–15.5)
RDW: 20.5 % — ABNORMAL HIGH (ref 11.5–15.5)
RDW: 21 % — ABNORMAL HIGH (ref 11.5–15.5)
WBC: 6.7 10*3/uL (ref 4.0–10.5)
WBC: 10.8 10*3/uL — ABNORMAL HIGH (ref 4.0–10.5)
WBC: 7.4 10*3/uL (ref 4.0–10.5)
WBC: 6.6 10*3/uL (ref 4.0–10.5)
nRBC: 0.4 % — ABNORMAL HIGH (ref 0.0–0.2)
nRBC: 0.4 % — ABNORMAL HIGH (ref 0.0–0.2)
nRBC: 0.3 % — ABNORMAL HIGH (ref 0.0–0.2)
nRBC: 0 % (ref 0.0–0.2)

## 2021-09-18 LAB — RESP PANEL BY RT-PCR (FLU A&B, COVID) ARPGX2
Influenza A by PCR: NEGATIVE
Influenza B by PCR: NEGATIVE
SARS Coronavirus 2 by RT PCR: NEGATIVE

## 2021-09-18 LAB — PROTIME-INR
INR: 3.6 — ABNORMAL HIGH (ref 0.8–1.2)
INR: 1.1 (ref 0.8–1.2)
Prothrombin Time: 35.5 seconds — ABNORMAL HIGH (ref 11.4–15.2)
Prothrombin Time: 14.7 seconds (ref 11.4–15.2)

## 2021-09-18 LAB — SAVE SMEAR(SSMR), FOR PROVIDER SLIDE REVIEW

## 2021-09-18 LAB — PLATELET COUNT: Platelets: 123 10*3/uL — ABNORMAL LOW (ref 150–400)

## 2021-09-18 LAB — BASIC METABOLIC PANEL
Anion gap: 4 — ABNORMAL LOW (ref 5–15)
BUN: <5 mg/dL — ABNORMAL LOW (ref 6–20)
CO2: 23 mmol/L (ref 22–32)
Calcium: 7.8 mg/dL — ABNORMAL LOW (ref 8.9–10.3)
Chloride: 103 mmol/L (ref 98–111)
Creatinine, Ser: 0.53 mg/dL (ref 0.44–1.00)
GFR, Estimated: >60 mL/min (ref >60)
Glucose, Bld: 88 mg/dL (ref 70–99)
Potassium: 3.8 mmol/L (ref 3.5–5.1)
Sodium: 130 mmol/L — ABNORMAL LOW (ref 135–145)

## 2021-09-18 LAB — TYPE AND SCREEN
ABO/RH(D): A POS
Antibody Screen: NEGATIVE
Unit division: 0
Unit division: 0
Unit division: 0

## 2021-09-18 LAB — COMPREHENSIVE METABOLIC PANEL
ALT: 29 U/L (ref 0–44)
AST: 55 U/L — ABNORMAL HIGH (ref 15–41)
Albumin: 2.2 g/dL — ABNORMAL LOW (ref 3.5–5.0)
Alkaline Phosphatase: 159 U/L — ABNORMAL HIGH (ref 38–126)
Anion gap: 7 (ref 5–15)
BUN: <5 mg/dL — ABNORMAL LOW (ref 6–20)
CO2: 20 mmol/L — ABNORMAL LOW (ref 22–32)
Calcium: 7.5 mg/dL — ABNORMAL LOW (ref 8.9–10.3)
Chloride: 105 mmol/L (ref 98–111)
Creatinine, Ser: 0.37 mg/dL — ABNORMAL LOW (ref 0.44–1.00)
GFR, Estimated: >60 mL/min (ref >60)
Glucose, Bld: 83 mg/dL (ref 70–99)
Potassium: 4.4 mmol/L (ref 3.5–5.1)
Sodium: 132 mmol/L — ABNORMAL LOW (ref 135–145)
Total Bilirubin: 1.6 mg/dL — ABNORMAL HIGH (ref 0.3–1.2)
Total Protein: 4.1 g/dL — ABNORMAL LOW (ref 6.5–8.1)

## 2021-09-18 LAB — BPAM RBC
Blood Product Expiration Date: 202303012359
Blood Product Expiration Date: 202303012359
Blood Product Expiration Date: 202303132359
ISSUE DATE / TIME: 202302061206
ISSUE DATE / TIME: 202302061552
Unit Type and Rh: 6200
Unit Type and Rh: 6200
Unit Type and Rh: 6200

## 2021-09-18 LAB — PREPARE RBC (CROSSMATCH)

## 2021-09-18 LAB — LACTATE DEHYDROGENASE: LDH: 174 U/L (ref 98–192)

## 2021-09-18 LAB — LACTIC ACID, PLASMA
Lactic Acid, Venous: 3.5 mmol/L (ref 0.5–1.9)
Lactic Acid, Venous: 1.1 mmol/L (ref 0.5–1.9)
Lactic Acid, Venous: 2.1 mmol/L (ref 0.5–1.9)

## 2021-09-18 LAB — MAGNESIUM: Magnesium: 1.9 mg/dL (ref 1.7–2.4)

## 2021-09-18 LAB — APTT: aPTT: 21 seconds — ABNORMAL LOW (ref 24–36)

## 2021-09-18 SURGERY — ESOPHAGOGASTRODUODENOSCOPY (EGD) WITH PROPOFOL
Anesthesia: General

## 2021-09-18 MED ORDER — SODIUM CHLORIDE 0.9 % IV SOLN: INTRAVENOUS | Status: DC

## 2021-09-18 MED ORDER — SODIUM CHLORIDE 0.9 % IV SOLN: 10.0000 mL/h | Freq: Once | INTRAVENOUS | Status: DC

## 2021-09-18 MED ORDER — SODIUM CHLORIDE 0.9% IV SOLUTION: Freq: Once | INTRAVENOUS | Status: DC

## 2021-09-18 MED ORDER — PANTOPRAZOLE 80MG IVPB - SIMPLE MED
80.0000 mg | Freq: Once | INTRAVENOUS | Status: AC
  Administered 2021-09-18: 80 mg via INTRAVENOUS
  Filled 2021-09-18 (×2): qty 100

## 2021-09-18 MED ORDER — LORAZEPAM 2 MG/ML IJ SOLN: 1.0000 mg | INTRAMUSCULAR | Status: DC | PRN

## 2021-09-18 MED ORDER — VITAMIN K1 10 MG/ML IJ SOLN
5.0000 mg | Freq: Once | INTRAVENOUS | Status: AC
  Administered 2021-09-18: 5 mg via INTRAVENOUS
  Filled 2021-09-18: qty 0.5

## 2021-09-18 MED ORDER — ONDANSETRON HCL 4 MG/2ML IJ SOLN: 4.0000 mg | Freq: Three times a day (TID) | INTRAMUSCULAR | Status: DC | PRN

## 2021-09-18 MED ORDER — TIZANIDINE HCL 4 MG PO TABS
4.0000 mg | ORAL_TABLET | Freq: Four times a day (QID) | ORAL | Status: DC | PRN
  Administered 2021-09-18: 4 mg via ORAL
  Filled 2021-09-18 (×3): qty 1

## 2021-09-18 MED ORDER — FOLIC ACID 1 MG PO TABS
1.0000 mg | ORAL_TABLET | Freq: Every day | ORAL | Status: DC
  Administered 2021-09-18: 1 mg via ORAL
  Filled 2021-09-18: qty 1

## 2021-09-18 MED ORDER — PANTOPRAZOLE SODIUM 40 MG IV SOLR: 40.0000 mg | Freq: Two times a day (BID) | INTRAVENOUS | Status: DC

## 2021-09-18 MED ORDER — LORAZEPAM 2 MG/ML IJ SOLN: 0.0000 mg | Freq: Two times a day (BID) | INTRAMUSCULAR | Status: DC

## 2021-09-18 MED ORDER — LORAZEPAM 1 MG PO TABS: 1.0000 mg | ORAL_TABLET | ORAL | Status: DC | PRN

## 2021-09-18 MED ORDER — PANTOPRAZOLE SODIUM 40 MG IV SOLR
40.0000 mg | Freq: Once | INTRAVENOUS | Status: AC
  Administered 2021-09-18: 40 mg via INTRAVENOUS

## 2021-09-18 MED ORDER — SODIUM CHLORIDE 0.9 % IV SOLN
10.0000 mL/h | Freq: Once | INTRAVENOUS | Status: AC
Start: 2021-09-18 — End: 2021-09-18
  Administered 2021-09-18: 10 mL/h via INTRAVENOUS

## 2021-09-18 MED ORDER — NICOTINE 21 MG/24HR TD PT24
21.0000 mg | MEDICATED_PATCH | Freq: Every day | TRANSDERMAL | Status: DC
  Filled 2021-09-18: qty 1

## 2021-09-18 MED ORDER — DEXTROSE-NACL 5-0.9 % IV SOLN
INTRAVENOUS | Status: DC
  Administered 2021-09-18: 900 mL via INTRAVENOUS

## 2021-09-18 MED ORDER — SODIUM CHLORIDE 0.9 % IV BOLUS
1000.0000 mL | Freq: Once | INTRAVENOUS | Status: AC
  Administered 2021-09-18: 1000 mL via INTRAVENOUS

## 2021-09-18 MED ORDER — THIAMINE HCL 100 MG PO TABS: 100.0000 mg | ORAL_TABLET | Freq: Every day | ORAL | Status: DC

## 2021-09-18 MED ORDER — PANTOPRAZOLE INFUSION (NEW) - SIMPLE MED
8.0000 mg/h | INTRAVENOUS | Status: DC
  Administered 2021-09-18 (×2): 8 mg/h via INTRAVENOUS
  Filled 2021-09-18 (×2): qty 100

## 2021-09-18 MED ORDER — ADULT MULTIVITAMIN W/MINERALS CH
1.0000 | ORAL_TABLET | Freq: Every day | ORAL | Status: DC
  Administered 2021-09-18: 1 via ORAL
  Filled 2021-09-18: qty 1

## 2021-09-18 MED ORDER — SODIUM CHLORIDE 0.9 % IV BOLUS
500.0000 mL | Freq: Once | INTRAVENOUS | Status: AC
  Administered 2021-09-18: 500 mL via INTRAVENOUS

## 2021-09-18 MED ORDER — THIAMINE HCL 100 MG/ML IJ SOLN
100.0000 mg | Freq: Every day | INTRAMUSCULAR | Status: DC
  Administered 2021-09-18: 100 mg via INTRAVENOUS
  Filled 2021-09-18: qty 2

## 2021-09-18 MED ORDER — IOHEXOL 350 MG/ML SOLN
100.0000 mL | Freq: Once | INTRAVENOUS | Status: AC | PRN
  Administered 2021-09-18: 100 mL via INTRAVENOUS

## 2021-09-18 MED ORDER — LORAZEPAM 2 MG/ML IJ SOLN: 0.0000 mg | Freq: Four times a day (QID) | INTRAMUSCULAR | Status: DC

## 2021-09-18 NOTE — H&P (Addendum)
History and Physical    Gabriela White [id number] DOB: 11/13/1980 DOA: 09/18/2021  Referring MD/NP/PA:   PCP: Glean Hess, MD   Patient coming from:  The patient is coming from home.   Chief Complaint: GI bleeding  HPI: Gabriela White is a 41 y.o. female with medical history significant of hypertension, GERD, tobacco abuse, alcohol abuse, anemia, who presents with GI bleeding.  Patient was hospitalized on 09/16/2021 due to GI bleeding.  Patient was seen by Dr. Marius Ditch on 2/7, planning to do EGD and colonoscopy, but pt left the hospital AMA this morning.   She comes back to hospital due to continuation of rectal bleeding.  Patient states that she has had 2 episode of bright red rectal bleeding after left hospital.  She has generalized weakness and lightheadedness.  She has some nausea, no vomiting, abdominal pain.  No fever or chills.  Denies shortness breath, chest pain, cough.  No symptoms of UTI.  Denies vaginal bleeding.   Patient was initially hypotensive with blood pressure 66/48 which improved to 112/82 after given 1 L normal saline and received 2 unit of blood transfusion.  Initially tachycardic with heart rate 150s which improved to 100s in ED.   Data Reviewed and ED Course: pt was found to have hemoglobin 8.1 (9.5 on 09/17/2021), WBC 6.7, pending COVID PCR, renal function okay, temperature normal, RR 18, oxygen saturation 100% on room air. Pending CAT of GI bleeding.  Patient is admitted to PCU as inpatient.  Dr. Marius Ditch above GI is consulted.  EKG: I have personally reviewed.  Sinus rhythm, QTc 460, heart rate 115, early R wave question, nonspecific T wave change.   Review of Systems:   General: no fevers, chills, no body weight gain, has fatigue HEENT: no blurry vision, hearing changes or sore throat Respiratory: no dyspnea, coughing, wheezing CV: no chest pain, no palpitations GI: no nausea, vomiting, abdominal pain, diarrhea, constipation. Has GI bleeding GU: no  dysuria, burning on urination, increased urinary frequency, hematuria  Ext: no leg edema Neuro: no unilateral weakness, numbness, or tingling, no vision change or hearing loss Skin: no rash, no skin tear. MSK: No muscle spasm, no deformity, no limitation of range of movement in spin Heme: No easy bruising.  Travel history: No recent long distant travel.   Allergy:  Allergies  Allergen Reactions   Morphine Nausea And Vomiting    Past Medical History:  Diagnosis Date   Hypertension     Past Surgical History:  Procedure Laterality Date   TONSILLECTOMY     TUBAL LIGATION      Social History:  reports that she has been smoking cigarettes. She has a 10.00 pack-year smoking history. She has never used smokeless tobacco. She reports current alcohol use of about 6.0 - 9.0 standard drinks per week. She reports that she does not use drugs.  Family History:  Family History  Problem Relation Age of Onset   Diabetes Maternal Grandmother    Heart disease Maternal Grandmother    Heart attack Maternal Grandmother    Heart disease Maternal Grandfather      Prior to Admission medications   Medication Sig Start Date End Date Taking? Authorizing Provider  acetaminophen (TYLENOL) 500 MG tablet Take 500-1,000 mg by mouth every 6 (six) hours as needed for mild pain or moderate pain.    [provider]  ibuprofen (ADVIL) 200 MG tablet Take 400-600 mg by mouth every 6 (six) hours as needed for mild pain or moderate  pain.    [provider]  pantoprazole (PROTONIX) 40 MG tablet Take 1 tablet (40 mg total) by mouth daily. Patient not taking: Reported on 08/16/2021 06/26/21   Lucilla Lame, MD  tiZANidine (ZANAFLEX) 4 MG tablet Take 4 mg by mouth every 6 (six) hours as needed for muscle spasms.    [provider]    Physical Exam: Vitals:   09/18/21 1300 09/18/21 1315 09/18/21 1330 09/18/21 1345  BP: 123/82  (!) 140/100   Pulse: 100 (!) 102 (!) 118 (!) 102  Resp: 15 13  12 13   Temp:      TempSrc:      SpO2: 100% 100% 100% 100%  Weight:      Height:       General: Not in acute distress.  Pale looking HEENT:       Eyes: PERRL, EOMI, no scleral icterus.       ENT: No discharge from the ears and nose, no pharynx injection, no tonsillar enlargement.        Neck: No JVD, no bruit, no mass felt. Heme: No neck lymph node enlargement. Cardiac: S1/S2, RRR, No murmurs, No gallops or rubs. Respiratory: No rales, wheezing, rhonchi or rubs. GI: Soft, nondistended, nontender, no rebound pain, no organomegaly, BS present. GU: No hematuria Ext: No pitting leg edema bilaterally. 1+DP/PT pulse bilaterally. Musculoskeletal: No joint deformities, No joint redness or warmth, no limitation of ROM in spin. Skin: No rashes.  Neuro: Alert, oriented X3, cranial nerves II-XII grossly intact, moves all extremities normally.  Psych: Patient is not psychotic, no suicidal or hemocidal ideation.  Labs on Admission: I have personally reviewed following labs and imaging studies  CBC: Recent Labs  Lab 09/16/21 0930 09/16/21 2203 09/17/21 0256 09/17/21 1441 09/18/21 0458 09/18/21 1140  WBC 10.7*  --   --   --  6.7 10.8*  HGB 5.9* 8.4* 7.9* 9.5* 8.1* 10.1*  HCT 17.6* 24.4* 23.0* 28.1* 24.6* 30.5*  MCV 111.4*  --   --   --  95.7 92.4  PLT 241  --   --   --  137* 123XX123*   Basic Metabolic Panel: Recent Labs  Lab 09/16/21 0930 09/17/21 0256 09/18/21 0458 09/18/21 1140  NA 136 131* 130* 132*  K 3.7 3.3* 3.8 4.4  CL 101 99 103 105  CO2 25 25 23  20*  GLUCOSE 80 82 88 83  BUN 8 8 <5* <5*  CREATININE 0.55 0.47 0.53 0.37*  CALCIUM 7.6* 7.5* 7.8* 7.5*  MG 1.7 1.5* 1.9  --    GFR: Estimated Creatinine Clearance: 78.1 mL/min (A) (by C-G formula based on SCr of 0.37 mg/dL (L)). Liver Function Tests: Recent Labs  Lab 09/16/21 0930 09/18/21 1140  AST 167* 55*  ALT 50* 29  ALKPHOS 230* 159*  BILITOT 0.7 1.6*  PROT 4.9* 4.1*  ALBUMIN 2.5* 2.2*   Recent Labs  Lab  09/16/21 0930  LIPASE 30   Recent Labs  Lab 09/16/21 0937  AMMONIA 19   Coagulation Profile: Recent Labs  Lab 09/16/21 0930 09/18/21 1011  INR 1.1 3.6*   Cardiac Enzymes: No results for input(s): CKTOTAL, CKMB, CKMBINDEX, TROPONINI in the last 168 hours. BNP (last 3 results) No results for input(s): PROBNP in the last 8760 hours. HbA1C: No results for input(s): HGBA1C in the last 72 hours. CBG: No results for input(s): GLUCAP in the last 168 hours. Lipid Profile: No results for input(s): CHOL, HDL, LDLCALC, TRIG, CHOLHDL, LDLDIRECT in the last 72 hours.  Thyroid Function Tests: No results for input(s): TSH, T4TOTAL, FREET4, T3FREE, THYROIDAB in the last 72 hours. Anemia Panel: Recent Labs    09/16/21 0931 09/17/21 0256  VITAMINB12  --  1,210*  FOLATE 3.9*  --    Urine analysis:    Component Value Date/Time   COLORURINE STRAW (A) 12/11/2020 1536   APPEARANCEUR CLEAR 12/11/2020 1536   LABSPEC 1.010 12/11/2020 1536   PHURINE 7.0 12/11/2020 [place] 12/11/2020 1536   HGBUR NEGATIVE 12/11/2020 1536   [place] 12/11/2020 1536   KETONESUR NEGATIVE 12/11/2020 1536   PROTEINUR NEGATIVE 12/11/2020 1536   NITRITE NEGATIVE 12/11/2020 1536   [place] 12/11/2020 1536   Sepsis Labs: @LABRCNTIP (procalcitonin:4,lacticidven:4) ) Recent Results (from the past 240 hour(s))  Resp Panel by RT-PCR (Flu A&B, Covid) Nasopharyngeal Swab     Status: None   Collection Time: 09/16/21  9:37 AM   Specimen: Nasopharyngeal Swab; Nasopharyngeal(NP) swabs in vial transport medium  Result Value Ref Range Status   SARS Coronavirus 2 by RT PCR NEGATIVE NEGATIVE Final    Comment: (NOTE) SARS-CoV-2 target nucleic acids are NOT DETECTED.  The SARS-CoV-2 RNA is generally detectable in upper respiratory specimens during the acute phase of infection. The lowest concentration of SARS-CoV-2 viral copies this assay can detect is 138 copies/mL. A negative  result does not preclude SARS-Cov-2 infection and should not be used as the sole basis for treatment or other patient management decisions. A negative result may occur with  improper specimen collection/handling, submission of specimen other than nasopharyngeal swab, presence of viral mutation(s) within the areas targeted by this assay, and inadequate number of viral copies(<138 copies/mL). A negative result must be combined with clinical observations, patient history, and epidemiological information. The expected result is Negative.  Fact Sheet for Patients:  EntrepreneurPulse.com.au  Fact Sheet for Healthcare Providers:  IncredibleEmployment.be  This test is no t yet approved or cleared by the [place] FDA and  has been authorized for detection and/or diagnosis of SARS-CoV-2 by FDA under an Emergency Use Authorization (EUA). This EUA will remain  in effect (meaning this test can be used) for the duration of the COVID-19 declaration under Section 564(b)(1) of the Act, 21 U.S.C.section 360bbb-3(b)(1), unless the authorization is terminated  or revoked sooner.       Influenza A by PCR NEGATIVE NEGATIVE Final   Influenza B by PCR NEGATIVE NEGATIVE Final    Comment: (NOTE) The Xpert Xpress SARS-CoV-2/FLU/RSV plus assay is intended as an aid in the diagnosis of influenza from Nasopharyngeal swab specimens and should not be used as a sole basis for treatment. Nasal washings and aspirates are unacceptable for Xpert Xpress SARS-CoV-2/FLU/RSV testing.  Fact Sheet for Patients: EntrepreneurPulse.com.au  Fact Sheet for Healthcare Providers: IncredibleEmployment.be  This test is not yet approved or cleared by the [place] FDA and has been authorized for detection and/or diagnosis of SARS-CoV-2 by FDA under an Emergency Use Authorization (EUA). This EUA will remain in effect (meaning this test can be used)  for the duration of the COVID-19 declaration under Section 564(b)(1) of the Act, 21 U.S.C. section 360bbb-3(b)(1), unless the authorization is terminated or revoked.  Performed at [facility], [address] [place], [place] [address]   Resp Panel by RT-PCR (Flu A&B, Covid) Nasopharyngeal Swab     Status: None   Collection Time: 09/18/21  9:57 AM   Specimen: Nasopharyngeal Swab; Nasopharyngeal(NP) swabs in vial transport medium  Result Value Ref Range Status   SARS  Coronavirus 2 by RT PCR NEGATIVE NEGATIVE Final    Comment: (NOTE) SARS-CoV-2 target nucleic acids are NOT DETECTED.  The SARS-CoV-2 RNA is generally detectable in upper respiratory specimens during the acute phase of infection. The lowest concentration of SARS-CoV-2 viral copies this assay can detect is 138 copies/mL. A negative result does not preclude SARS-Cov-2 infection and should not be used as the sole basis for treatment or other patient management decisions. A negative result may occur with  improper specimen collection/handling, submission of specimen other than nasopharyngeal swab, presence of viral mutation(s) within the areas targeted by this assay, and inadequate number of viral copies(<138 copies/mL). A negative result must be combined with clinical observations, patient history, and epidemiological information. The expected result is Negative.  Fact Sheet for Patients:  EntrepreneurPulse.com.au  Fact Sheet for Healthcare Providers:  IncredibleEmployment.be  This test is no t yet approved or cleared by the [place] FDA and  has been authorized for detection and/or diagnosis of SARS-CoV-2 by FDA under an Emergency Use Authorization (EUA). This EUA will remain  in effect (meaning this test can be used) for the duration of the COVID-19 declaration under Section 564(b)(1) of the Act, 21 U.S.C.section 360bbb-3(b)(1), unless the authorization is terminated   or revoked sooner.       Influenza A by PCR NEGATIVE NEGATIVE Final   Influenza B by PCR NEGATIVE NEGATIVE Final    Comment: (NOTE) The Xpert Xpress SARS-CoV-2/FLU/RSV plus assay is intended as an aid in the diagnosis of influenza from Nasopharyngeal swab specimens and should not be used as a sole basis for treatment. Nasal washings and aspirates are unacceptable for Xpert Xpress SARS-CoV-2/FLU/RSV testing.  Fact Sheet for Patients: EntrepreneurPulse.com.au  Fact Sheet for Healthcare Providers: IncredibleEmployment.be  This test is not yet approved or cleared by the [place] FDA and has been authorized for detection and/or diagnosis of SARS-CoV-2 by FDA under an Emergency Use Authorization (EUA). This EUA will remain in effect (meaning this test can be used) for the duration of the COVID-19 declaration under Section 564(b)(1) of the Act, 21 U.S.C. section 360bbb-3(b)(1), unless the authorization is terminated or revoked.  Performed at [facility], [address] [place], [place] [address]      Radiological Exams on Admission: DG Chest Portable 1 View  Result Date: 09/18/2021 CLINICAL DATA:  Hypotension EXAM: PORTABLE CHEST 1 VIEW COMPARISON:  None. FINDINGS: The heart size and mediastinal contours are within normal limits. Both lungs are clear. The visualized skeletal structures are unremarkable. IMPRESSION: No active disease. Electronically Signed   By: Yetta Glassman M.D.   On: 09/18/2021 10:29   US LIVER DOPPLER  Result Date: 09/16/2021 CLINICAL DATA:  Abnormal liver function tests EXAM: DUPLEX ULTRASOUND OF LIVER TECHNIQUE: Color and duplex Doppler ultrasound was performed to evaluate the hepatic in-flow and out-flow vessels. COMPARISON:  None. FINDINGS: Liver: There is increased echogenicity in the liver suggesting fatty infiltration. No focal abnormality is seen in the visualized portions of the liver. Normal hepatic  contour without nodularity. No focal lesion, mass or intrahepatic biliary ductal dilatation. Main Portal Vein size: 1.04 cm Portal Vein Velocities Main Prox:  58.5 cm/sec Main Mid: 53.9 cm/sec Main Dist:  51.7 cm/sec Right: 66.7 cm/sec Left: 41.3 cm/sec Hepatic Vein Velocities Right:  21 cm/sec Middle:  21.2 cm/sec Left:  40 cm/sec IVC: Present and patent with normal respiratory phasicity. Hepatic Artery Velocity:  226 cm/sec Splenic Vein Velocity:  26.9 cm/sec Spleen: 6.9 cm x 8.2 cm x 4  cm with a total volume of 116.1 cm^3 (411 cm^3 is upper limit normal) Portal Vein Occlusion/Thrombus: No Splenic Vein Occlusion/Thrombus: No Ascites: None Varices: None IMPRESSION: Fatty liver. Velocity measurements as described in the body of the report. Electronically Signed   By: Elmer Picker M.D.   On: 09/16/2021 15:01   CT ANGIO GI BLEED  Result Date: 09/18/2021 CLINICAL DATA:  GI bleed EXAM: CTA ABDOMEN AND PELVIS WITHOUT AND WITH CONTRAST TECHNIQUE: Multidetector CT imaging of the abdomen and pelvis was performed using the standard protocol during bolus administration of intravenous contrast. Multiplanar reconstructed images and MIPs were obtained and reviewed to evaluate the vascular anatomy. RADIATION DOSE REDUCTION: This exam was performed according to the departmental dose-optimization program which includes automated exposure control, adjustment of the mA and/or kV according to patient size and/or use of iterative reconstruction technique. CONTRAST:  192mL OMNIPAQUE IOHEXOL 350 MG/ML SOLN COMPARISON:  None. FINDINGS: VASCULAR No evidence of active GI bleed. Scattered areas of hypo high attenuation are seen in the cecum, appendix and rectum which are also present on noncontrast exam, likely due to retained intraluminal contrast material. Normal caliber abdominal aorta with mild atherosclerotic disease. Major branch vessels are widely patent. Iliofemoral vessels are widely patent. NON-VASCULAR Lower chest: Trace  bilateral pleural effusions, Hepatobiliary: Hepatic steatosis. No focal liver lesions. Gallbladder is unremarkable. No biliary ductal dilation. Pancreas: Unremarkable. No pancreatic ductal dilatation or surrounding inflammatory changes. Spleen: Normal in size without focal abnormality. Adrenals/Urinary Tract: Adrenal glands are unremarkable. No hydronephrosis or nephrolithiasis. Tiny bilateral low-attenuation renal lesions which are too small to completely characterize. Bladder is unremarkable. Stomach/Bowel: Gastric wall thickening and mucosal hyperenhancement. Normal appendix. No evidence of obstruction, bowel wall thickening or inflammatory change. Lymphatic: No pathologically enlarged lymph nodes seen in the abdomen or pelvis. Reproductive: Uterus and bilateral adnexa are unremarkable. Other: No abdominal wall hernia or abnormality. Trace pelvic free fluid. Musculoskeletal: No acute or significant osseous findings. IMPRESSION: VASCULAR 1. No evidence of active GI bleed. NON-VASCULAR 1. Wall thickening and mucosal hyperenhancement of the stomach, concerning for gastritis. 2. Trace bilateral pleural effusions and trace pelvic free fluid. 3. Hepatic steatosis. 4. Aortic Atherosclerosis (ICD10-I70.0). Electronically Signed   By: Yetta Glassman M.D.   On: 09/18/2021 12:29      Assessment/Plan Principal Problem:   GI bleeding Active Problems:   Acute blood loss anemia (ABLA)   Elevated INR   Alcohol abuse   Essential hypertension   Tobacco use disorder   Gastroesophageal reflux disease   Elevated liver function tests   Elevated lactic acid level   Assessment and Plan: * GI bleeding- (present on admission) Hgb dropped from 9.5 on 09/17/21 to 8.1. pt has INR 3.6. Dr. Marius Ditch of GI is consulted.  - will admitted to progressive bed as inpatient - 2 Units of blood was transfused in ED - 1 unit of frozen plasma is being transfused - f/u CT angiogram GI bleed  - will give 5 mg of Vk by IV - check  PTT - IVF: 1L NS bolus, then at D5-NS 75 mL/hr - Start IV pantoprazole gtt - Zofran IV for nausea - Avoid NSAIDs and SQ heparin - Maintain IV access (2 large bore IVs if possible). - Monitor closely and follow q6h cbc, transfuse as necessary, if Hgb<7.0 - hold Ibuprofen    Elevated INR- (present on admission) - Transfuse 1 unit of for frozen plasma -Give 59m g of vitamin K by IV -check PTT  Acute blood loss anemia (ABLA)- (present  on admission) See A&P under GI bleeding  Alcohol abuse- (present on admission) -CiWA protocol -Due to counseling about importance of quitting alcohol use  Essential hypertension- (present on admission) - Hold blood pressure medications due to hypotension  Tobacco use disorder- (present on admission) - Nicotine patch -Data counseling about importance of quitting tobacco use  Gastroesophageal reflux disease- (present on admission) - On IV Protonix  Elevated liver function tests- (present on admission) Patient had abnormal liver function on 2/6 with ALP 230, AST 167, ALT 50, total bilirubin 0.8.  This is mostly due to alcohol abuse. Ultrasound of liver Doppler revealed fatty liver only, no evidence of portal vein thrombosis  -Due to counseling about importance of quitting alcohol  Elevated lactic acid level- (present on admission) Left facet 3.6, no signs of infection.  No fever or leukocytosis.  Likely due to GI bleeding, dehydration and starvation and alcohol abuse -IV fluid as above    Addendum: Thrombocytopenia. The repeated CBC showed platelet 29 which was 121 earlier.  -will repeat Platelet count -will get LDH and peripheral smear. -if the repeated Platelet is still 29 or lower, will consult hematologist. -I communicated with on-call floor Coverage NP, Neomia Glass about this plan.       DVT ppx: SCD  Code Status: Full code  Family Communication: Yes, patient's  husband  at bed side.    Disposition Plan:  Anticipate discharge back  to previous environment  Consults called:  Dr. Marius Ditch of GI  Admission status and Level of care: Progressive:    as inpt     Severity of Illness:  The appropriate patient status for this patient is INPATIENT. Inpatient status is judged to be reasonable and necessary in order to provide the required intensity of service to ensure the patient's safety. The patient's presenting symptoms, physical exam findings, and initial radiographic and laboratory data in the context of their chronic comorbidities is felt to place them at high risk for further clinical deterioration. Furthermore, it is not anticipated that the patient will be medically stable for discharge from the hospital within 2 midnights of admission.   * I certify that at the point of admission it is my clinical judgment that the patient will require inpatient hospital care spanning beyond 2 midnights from the point of admission due to high intensity of service, high risk for further deterioration and high frequency of surveillance required.*       Date of Service 09/18/2021    Ivor Costa [facility]   If 7PM-7AM, please contact night-coverage [email or web address] 09/18/2021, 2:05 PM

## 2021-09-18 NOTE — Assessment & Plan Note (Signed)
-   Transfuse 1 unit of for frozen plasma -Give 38m g of vitamin K by IV -check PTT

## 2021-09-18 NOTE — Assessment & Plan Note (Signed)
-   Hold blood pressure medications due to hypotension ?

## 2021-09-18 NOTE — Assessment & Plan Note (Signed)
See A&P under GI bleeding

## 2021-09-18 NOTE — Progress Notes (Signed)
Date and time results received: 09/18/21  1814 (use smartphrase ".now" to insert current time)  Test: Lactic acid  Critical Value: 2.1  Name of Provider Notified: Clyde Lundborg  Orders Received? Or Actions Taken?:  MD made aware

## 2021-09-18 NOTE — ED Notes (Signed)
Pt's last drink of alcohol was 5 days ago. Denies any symptoms of withdrawal.

## 2021-09-18 NOTE — Progress Notes (Signed)
Date and time results received: 09/18/21 1833  (use smartphrase ".now" to insert current time)  Test: platelet  Critical Value: 29  Name of Provider Notified: Dr. Mauri Reading   Orders Received? Or Actions Taken?: Orders Received - See Orders for details  Sabrina Keough, Kae Heller, RN

## 2021-09-18 NOTE — Plan of Care (Signed)
  Problem: Education: Goal: Knowledge of General Education information will improve Description Including pain rating scale, medication(s)/side effects and non-pharmacologic comfort measures Outcome: Progressing   

## 2021-09-18 NOTE — Assessment & Plan Note (Signed)
-  CiWA protocol -Due to counseling about importance of quitting alcohol use

## 2021-09-18 NOTE — Progress Notes (Signed)
Patient met me in hallway asking how to get to exit. Patients states that she removed her own IV, and took off her telemetry monitor. Asked patient to sign AMA form. Patient was agreeable. Patient left floor. This nurse then went to room to retrieve telemetry monitor, and patient was still in sitting in room. I advised mother that if patient has left unit then she must leave as well. Patient mother called me a "Dumb b*tch" and told me to get out of room. Advised charge nurse to please assist mother to leave unit. MD made aware.

## 2021-09-18 NOTE — Progress Notes (Signed)
Entered patient room to advise that she will be receiving blood transfusion, in hopes to improve vitals so that patient can proceed with procedure for today. Patient states she is not interested in receiving blood, and that she did not complete her prep and had a formed stool. Patient also states that if she cannot complete EGD/Colonoscopy today then she does not want one. Advised patient that she is within right to make decisions, however she is in need of care. Also advised that when leaving AMA patient insurance will sometimes deny claim. Patient mother was at bedside and said she would try to convince patient to stay. MD made aware.

## 2021-09-18 NOTE — Progress Notes (Signed)
Patient HR 160's-180's with ambulation, b/p low. MD alerted.

## 2021-09-18 NOTE — ED Provider Notes (Signed)
[facility] Provider Note    Event Date/Time   First MD Initiated Contact with Patient 09/18/21 [phone]     (approximate)   History   GI Bleeding  Level v Caveat:  ams hypotension  HPI  Gabriela White is a 41 y.o. female with recent admission to hospitalist service for GI bleed reported alcoholic gastritis leaving AMA this morning was the hospital roughly 1 hour became increasingly weak we presents to the ER.  Patient found to be tachycardic hypotensive very pale appearing.     Physical Exam   Triage Vital Signs: ED Triage Vitals  Enc Vitals Group     BP 09/18/21 0922 (!) 66/48     Pulse Rate 09/18/21 0922 (!) 150     Resp 09/18/21 0922 18     Temp --      Temp src --      SpO2 09/18/21 0922 100 %     Weight 09/18/21 0920 125 lb (56.7 kg)     Height 09/18/21 0920 5\' 3"  (1.6 m)     Head Circumference --      Peak Flow --      Pain Score 09/18/21 0920 10     Pain Loc --      Pain Edu? --      Excl. in GC? --     Most recent vital signs: Vitals:   09/18/21 1400 09/18/21 1430  BP: (!) 151/110   Pulse: (!) 128 (!) 104  Resp: (!) 21 (!) 21  Temp:  98 F (36.7 C)  SpO2: 100% 99%     Constitutional: critically ill appearing, pale Eyes: Conjunctivae are normal.  Head: Atraumatic. Nose: No congestion/rhinnorhea. Mouth/Throat: Mucous membranes are dry Neck: Painless ROM.  Cardiovascular:   Good peripheral circulation. Respiratory: Normal respiratory effort.  No retractions.  Gastrointestinal: Soft and nontender.  Musculoskeletal:  no deformity Neurologic:  MAE spontaneously. No gross focal neurologic deficits are appreciated.  Skin:  Skin is warm, dry and intact. No rash noted. Psychiatric: Mood and affect are normal. Speech and behavior are normal.    ED Results / Procedures / Treatments   Labs (all labs ordered are listed, but only abnormal results are displayed) Labs Reviewed  PROTIME-INR - Abnormal; Notable for the following  components:      Result Value   Prothrombin Time 35.5 (*)    INR 3.6 (*)    All other components within normal limits  LACTIC ACID, PLASMA - Abnormal; Notable for the following components:   Lactic Acid, Venous 3.5 (*)    All other components within normal limits  CBC - Abnormal; Notable for the following components:   WBC 10.8 (*)    RBC 3.30 (*)    Hemoglobin 10.1 (*)    HCT 30.5 (*)    RDW 19.6 (*)    Platelets 121 (*)    nRBC 0.4 (*)    All other components within normal limits  COMPREHENSIVE METABOLIC PANEL - Abnormal; Notable for the following components:   Sodium 132 (*)    CO2 20 (*)    BUN <5 (*)    Creatinine, Ser 0.37 (*)    Calcium 7.5 (*)    Total Protein 4.1 (*)    Albumin 2.2 (*)    AST 55 (*)    Alkaline Phosphatase 159 (*)    Total Bilirubin 1.6 (*)    All other components within normal limits  RESP PANEL BY RT-PCR (FLU A&B, COVID)  ARPGX2  LACTIC ACID, PLASMA  CBC  CBC  APTT  PROTIME-INR  LACTIC ACID, PLASMA  PREPARE RBC (CROSSMATCH)  TYPE AND SCREEN  PREPARE FRESH FROZEN PLASMA     EKG ED ECG REPORT I, Willy Eddy, the attending physician, personally viewed and interpreted this ECG.   Date: 09/18/2021  EKG Time: 10:00  Rate: 115  Rhythm: sinus  Axis: normal  Intervals: normal intervals  ST&T Change: no stemi, no depression    RADIOLOGY Please see ED Course for my review and interpretation.  I personally reviewed all radiographic images ordered to evaluate for the above acute complaints and reviewed radiology reports and findings.  These findings were personally discussed with the patient.  Please see medical record for radiology report.    PROCEDURES:  Critical Care performed: Yes, see critical care procedure note(s)  .Critical Care Performed by: Willy Eddy, MD Authorized by: Willy Eddy, MD   Critical care provider statement:    Critical care time (minutes):  45   Critical care was time spent personally by  me on the following activities:  Ordering and performing treatments and interventions, ordering and review of laboratory studies, ordering and review of radiographic studies, pulse oximetry, re-evaluation of patient's condition, review of old charts, obtaining history from patient or surrogate, examination of patient, evaluation of patient's response to treatment, discussions with primary provider, discussions with consultants and development of treatment plan with patient or surrogate   MEDICATIONS ORDERED IN ED: Medications  pantoprozole (PROTONIX) 80 mg /NS 100 mL infusion (8 mg/hr Intravenous New Bag/Given 09/18/21 1136)  pantoprazole (PROTONIX) injection 40 mg (has no administration in time range)  0.9 %  sodium chloride infusion (10 mL/hr Intravenous Not Given 09/18/21 1350)  ondansetron (ZOFRAN) injection 4 mg (has no administration in time range)  LORazepam (ATIVAN) tablet 1-4 mg (has no administration in time range)    Or  LORazepam (ATIVAN) injection 1-4 mg (has no administration in time range)  thiamine tablet 100 mg ( Oral See Alternative 09/18/21 1318)    Or  thiamine (B-1) injection 100 mg (100 mg Intravenous Given 09/18/21 1318)  folic acid (FOLVITE) tablet 1 mg (1 mg Oral Given 09/18/21 1318)  multivitamin with minerals tablet 1 tablet (1 tablet Oral Given 09/18/21 1318)  LORazepam (ATIVAN) injection 0-4 mg (0 mg Intravenous Not Given 09/18/21 1318)    Followed by  LORazepam (ATIVAN) injection 0-4 mg (has no administration in time range)  nicotine (NICODERM CQ - dosed in mg/24 hours) patch 21 mg (21 mg Transdermal Patient Refused/Not Given 09/18/21 1319)  phytonadione (VITAMIN K) 5 mg in dextrose 5 % 50 mL IVPB (5 mg Intravenous New Bag/Given 09/18/21 1434)  tiZANidine (ZANAFLEX) tablet 4 mg (has no administration in time range)  dextrose 5 %-0.9 % sodium chloride infusion (0 mLs Intravenous Paused 09/18/21 1434)  0.9 %  sodium chloride infusion (0 mL/hr Intravenous Stopped 09/18/21 1350)  sodium  chloride 0.9 % bolus 1,000 mL (0 mLs Intravenous Stopped 09/18/21 1103)  pantoprazole (PROTONIX) 80 mg /NS 100 mL IVPB (0 mg Intravenous Stopped 09/18/21 1139)  pantoprazole (PROTONIX) injection 40 mg (40 mg Intravenous Given 09/18/21 1002)  sodium chloride 0.9 % bolus 500 mL (0 mLs Intravenous Stopped 09/18/21 1434)  iohexol (OMNIPAQUE) 350 MG/ML injection 100 mL (100 mLs Intravenous Contrast Given 09/18/21 1158)     IMPRESSION / MDM / ASSESSMENT AND PLAN / ED COURSE  I reviewed the triage vital signs and the nursing notes.  Differential diagnosis includes, but is not limited to, UGIB, LGIB, anemia, sepsis, dehydration, DIC  Patient arrives critically ill-appearing pale hypertensive tachycardic with admission for GI bleed leaving AMA today..  Given the acuity of her presentation emergency blood transfusion ordered she is protecting her airway.  Patient did consent to blood transfusion.   Clinical Course as of 09/18/21 1457  Wed Sep 18, 2021  1018 Blood pressure improved with IV fluid and emergency release blood transfusion. [PR]  1041 Patient's INR is elevated at 3.6.  Blood pressure is improving after transfusion IV fluids.  I will order FFP. [PR]  1046 Chest x-ray by my review does not show any evidence of aspiration or pneumothorax. [PR]  1052 I have consulted with Dr. Allegra Lai of GI.  Plan was for EGD colonoscopy today but as she patient was hypotensive with rectal bleeding upon arrival will order stat CTA to see if there is a localizable source of bleeding. [PR]  1117 Case discussed with hospitalist who has accepted patient to their service.  She is stable for admission to the hospital. [PR]  1225 Lactate AK-Tate is elevated to 3.5.  CT imaging my review does not show evidence of perforation.  Will await formal radiology report. [PR]    Clinical Course User Index [PR] Willy Eddy, MD     FINAL CLINICAL IMPRESSION(S) / ED DIAGNOSES   Final diagnoses:   Gastrointestinal hemorrhage, unspecified gastrointestinal hemorrhage type  Acute blood loss anemia     Rx / DC Orders   ED Discharge Orders     None        Note:  This document was prepared using Dragon voice recognition software and may include unintentional dictation errors.    Willy Eddy, MD 09/18/21 [phone]

## 2021-09-18 NOTE — Progress Notes (Signed)
Patient report having 1 bloody bm this afternoon, told her if she has another one to call nurse and let us see it. Dr Clyde Lundborg made aware of lab results and patient reporting bloody BM.  Babe Anthis, Kae Heller, RN

## 2021-09-18 NOTE — ED Notes (Signed)
Pt gives verbal consent for all blood products. Emergency transfusions needed. Consent to be signed later.

## 2021-09-18 NOTE — ED Triage Notes (Signed)
Pt comes into the ED via POV c/o GI bleeding.  PT left AMA from the admission floor less than an hour ago and returns to be able to go back.  Pt was scheduled for a blood transfusion and endoscopy today.  Pt has even and unlabored respirations currently.

## 2021-09-18 NOTE — ED Notes (Signed)
Pt taken to CT.

## 2021-09-18 NOTE — Assessment & Plan Note (Signed)
-   On IV Protonix 

## 2021-09-18 NOTE — Assessment & Plan Note (Signed)
Patient had abnormal liver function on 2/6 with ALP 230, AST 167, ALT 50, total bilirubin 0.8.  This is mostly due to alcohol abuse. Ultrasound of liver Doppler revealed fatty liver only, no evidence of portal vein thrombosis  -Due to counseling about importance of quitting alcohol

## 2021-09-18 NOTE — Assessment & Plan Note (Signed)
Left facet 3.6, no signs of infection.  No fever or leukocytosis.  Likely due to GI bleeding, dehydration and starvation and alcohol abuse -IV fluid as above

## 2021-09-18 NOTE — Progress Notes (Signed)
Gabriela Darby, MD [address]  [place]  [place], [place] [address]  Main: [phone]  Fax: [phone] Pager: [phone]   Subjective: Patient left AMA this morning and came back within few hours with tachycardia and hypotension.  Patient has been aggressively resuscitated.  She underwent CT angio GI bleed which was negative for active bleeding source.  Her hemoglobin was 8.1, received blood transfusion and FFP's.  Her hypotension and tachycardia has improved.  Patient is started on pantoprazole drip due to ongoing hematochezia Patient's husband, children and her girlfriend were at bedside  Objective: Vital signs in last 24 hours: Vitals:   09/18/21 1400 09/18/21 1430 09/18/21 1520 09/18/21 1911  BP: (!) 151/110  101/70 122/78  Pulse: (!) 128 (!) 104 85 86  Resp: (!) 21 (!) 21 18 16   Temp:  98 F (36.7 C) 98.6 F (37 C) 99 F (37.2 C)  TempSrc:  Axillary    SpO2: 100% 99% 100% 100%  Weight:   61.6 kg   Height:       Weight change:   Intake/Output Summary (Last 24 hours) at 09/18/2021 1911 Last data filed at 09/18/2021 1814 Gross per 24 hour  Intake 288.94 ml  Output --  Net 288.94 ml     Exam: Heart:: Tachycardia, regular rhythm Lungs: clear to auscultation Abdomen: soft, nontender, normal bowel sounds   Lab Results: CBC Latest Ref Rng & Units 09/18/2021 09/18/2021 09/18/2021  WBC 4.0 - 10.5 K/uL 7.4 10.8(H) 6.7  Hemoglobin 12.0 - 15.0 g/dL 8.7(L) 10.1(L) 8.1(L)  Hematocrit 36.0 - 46.0 % 25.5(L) 30.5(L) 24.6(L)  Platelets 150 - 400 K/uL 29(LL) 121(L) 137(L)   CMP Latest Ref Rng & Units 09/18/2021 09/18/2021 09/17/2021  Glucose 70 - 99 mg/dL 83 88 82  BUN 6 - 20 mg/dL <5(L) <5(L) 8  Creatinine 0.44 - 1.00 mg/dL 0.37(L) 0.53 0.47  Sodium 135 - 145 mmol/L 132(L) 130(L) 131(L)  Potassium 3.5 - 5.1 mmol/L 4.4 3.8 3.3(L)  Chloride 98 - 111 mmol/L 105 103 99  CO2 22 - 32 mmol/L 20(L) 23 25  Calcium 8.9 - 10.3 mg/dL 7.5(L) 7.8(L) 7.5(L)  Total Protein 6.5 -  8.1 g/dL 4.1(L) - -  Total Bilirubin 0.3 - 1.2 mg/dL 1.6(H) - -  Alkaline Phos 38 - 126 U/L 159(H) - -  AST 15 - 41 U/L 55(H) - -  ALT 0 - 44 U/L 29 - -    Micro Results: Recent Results (from the past 240 hour(s))  Resp Panel by RT-PCR (Flu A&B, Covid) Nasopharyngeal Swab     Status: None   Collection Time: 09/16/21  9:37 AM   Specimen: Nasopharyngeal Swab; Nasopharyngeal(NP) swabs in vial transport medium  Result Value Ref Range Status   SARS Coronavirus 2 by RT PCR NEGATIVE NEGATIVE Final    Comment: (NOTE) SARS-CoV-2 target nucleic acids are NOT DETECTED.  The SARS-CoV-2 RNA is generally detectable in upper respiratory specimens during the acute phase of infection. The lowest concentration of SARS-CoV-2 viral copies this assay can detect is 138 copies/mL. A negative result does not preclude SARS-Cov-2 infection and should not be used as the sole basis for treatment or other patient management decisions. A negative result may occur with  improper specimen collection/handling, submission of specimen other than nasopharyngeal swab, presence of viral mutation(s) within the areas targeted by this assay, and inadequate number of viral copies(<138 copies/mL). A negative result must be combined with clinical observations, patient history, and epidemiological information. The expected result  is Negative.  Fact Sheet for Patients:  EntrepreneurPulse.com.au  Fact Sheet for Healthcare Providers:  IncredibleEmployment.be  This test is no t yet approved or cleared by the [place] FDA and  has been authorized for detection and/or diagnosis of SARS-CoV-2 by FDA under an Emergency Use Authorization (EUA). This EUA will remain  in effect (meaning this test can be used) for the duration of the COVID-19 declaration under Section 564(b)(1) of the Act, 21 U.S.C.section 360bbb-3(b)(1), unless the authorization is terminated  or revoked sooner.        Influenza A by PCR NEGATIVE NEGATIVE Final   Influenza B by PCR NEGATIVE NEGATIVE Final    Comment: (NOTE) The Xpert Xpress SARS-CoV-2/FLU/RSV plus assay is intended as an aid in the diagnosis of influenza from Nasopharyngeal swab specimens and should not be used as a sole basis for treatment. Nasal washings and aspirates are unacceptable for Xpert Xpress SARS-CoV-2/FLU/RSV testing.  Fact Sheet for Patients: EntrepreneurPulse.com.au  Fact Sheet for Healthcare Providers: IncredibleEmployment.be  This test is not yet approved or cleared by the [place] FDA and has been authorized for detection and/or diagnosis of SARS-CoV-2 by FDA under an Emergency Use Authorization (EUA). This EUA will remain in effect (meaning this test can be used) for the duration of the COVID-19 declaration under Section 564(b)(1) of the Act, 21 U.S.C. section 360bbb-3(b)(1), unless the authorization is terminated or revoked.  Performed at [facility], [address] [place], [place] [address]   Resp Panel by RT-PCR (Flu A&B, Covid) Nasopharyngeal Swab     Status: None   Collection Time: 09/18/21  9:57 AM   Specimen: Nasopharyngeal Swab; Nasopharyngeal(NP) swabs in vial transport medium  Result Value Ref Range Status   SARS Coronavirus 2 by RT PCR NEGATIVE NEGATIVE Final    Comment: (NOTE) SARS-CoV-2 target nucleic acids are NOT DETECTED.  The SARS-CoV-2 RNA is generally detectable in upper respiratory specimens during the acute phase of infection. The lowest concentration of SARS-CoV-2 viral copies this assay can detect is 138 copies/mL. A negative result does not preclude SARS-Cov-2 infection and should not be used as the sole basis for treatment or other patient management decisions. A negative result may occur with  improper specimen collection/handling, submission of specimen other than nasopharyngeal swab, presence of viral mutation(s) within  the areas targeted by this assay, and inadequate number of viral copies(<138 copies/mL). A negative result must be combined with clinical observations, patient history, and epidemiological information. The expected result is Negative.  Fact Sheet for Patients:  EntrepreneurPulse.com.au  Fact Sheet for Healthcare Providers:  IncredibleEmployment.be  This test is no t yet approved or cleared by the [place] FDA and  has been authorized for detection and/or diagnosis of SARS-CoV-2 by FDA under an Emergency Use Authorization (EUA). This EUA will remain  in effect (meaning this test can be used) for the duration of the COVID-19 declaration under Section 564(b)(1) of the Act, 21 U.S.C.section 360bbb-3(b)(1), unless the authorization is terminated  or revoked sooner.       Influenza A by PCR NEGATIVE NEGATIVE Final   Influenza B by PCR NEGATIVE NEGATIVE Final    Comment: (NOTE) The Xpert Xpress SARS-CoV-2/FLU/RSV plus assay is intended as an aid in the diagnosis of influenza from Nasopharyngeal swab specimens and should not be used as a sole basis for treatment. Nasal washings and aspirates are unacceptable for Xpert Xpress SARS-CoV-2/FLU/RSV testing.  Fact Sheet for Patients: EntrepreneurPulse.com.au  Fact Sheet for Healthcare Providers: IncredibleEmployment.be  This test  is not yet approved or cleared by the [place] and has been authorized for detection and/or diagnosis of SARS-CoV-2 by FDA under an Emergency Use Authorization (EUA). This EUA will remain in effect (meaning this test can be used) for the duration of the COVID-19 declaration under Section 564(b)(1) of the Act, 21 U.S.C. section 360bbb-3(b)(1), unless the authorization is terminated or revoked.  Performed at [facility], [address] [place], [place] [address]    Studies/Results: DG Chest Portable 1  View  Result Date: 09/18/2021 CLINICAL DATA:  Hypotension EXAM: PORTABLE CHEST 1 VIEW COMPARISON:  None. FINDINGS: The heart size and mediastinal contours are within normal limits. Both lungs are clear. The visualized skeletal structures are unremarkable. IMPRESSION: No active disease. Electronically Signed   By: Yetta Glassman M.D.   On: 09/18/2021 10:29   CT ANGIO GI BLEED  Result Date: 09/18/2021 CLINICAL DATA:  GI bleed EXAM: CTA ABDOMEN AND PELVIS WITHOUT AND WITH CONTRAST TECHNIQUE: Multidetector CT imaging of the abdomen and pelvis was performed using the standard protocol during bolus administration of intravenous contrast. Multiplanar reconstructed images and MIPs were obtained and reviewed to evaluate the vascular anatomy. RADIATION DOSE REDUCTION: This exam was performed according to the departmental dose-optimization program which includes automated exposure control, adjustment of the mA and/or kV according to patient size and/or use of iterative reconstruction technique. CONTRAST:  111mL OMNIPAQUE IOHEXOL 350 MG/ML SOLN COMPARISON:  None. FINDINGS: VASCULAR No evidence of active GI bleed. Scattered areas of hypo high attenuation are seen in the cecum, appendix and rectum which are also present on noncontrast exam, likely due to retained intraluminal contrast material. Normal caliber abdominal aorta with mild atherosclerotic disease. Major branch vessels are widely patent. Iliofemoral vessels are widely patent. NON-VASCULAR Lower chest: Trace bilateral pleural effusions, Hepatobiliary: Hepatic steatosis. No focal liver lesions. Gallbladder is unremarkable. No biliary ductal dilation. Pancreas: Unremarkable. No pancreatic ductal dilatation or surrounding inflammatory changes. Spleen: Normal in size without focal abnormality. Adrenals/Urinary Tract: Adrenal glands are unremarkable. No hydronephrosis or nephrolithiasis. Tiny bilateral low-attenuation renal lesions which are too small to completely  characterize. Bladder is unremarkable. Stomach/Bowel: Gastric wall thickening and mucosal hyperenhancement. Normal appendix. No evidence of obstruction, bowel wall thickening or inflammatory change. Lymphatic: No pathologically enlarged lymph nodes seen in the abdomen or pelvis. Reproductive: Uterus and bilateral adnexa are unremarkable. Other: No abdominal wall hernia or abnormality. Trace pelvic free fluid. Musculoskeletal: No acute or significant osseous findings. IMPRESSION: VASCULAR 1. No evidence of active GI bleed. NON-VASCULAR 1. Wall thickening and mucosal hyperenhancement of the stomach, concerning for gastritis. 2. Trace bilateral pleural effusions and trace pelvic free fluid. 3. Hepatic steatosis. 4. Aortic Atherosclerosis (ICD10-I70.0). Electronically Signed   By: Yetta Glassman M.D.   On: 09/18/2021 12:29   Medications: I have reviewed the patient's current medications. Prior to Admission:  Medications Prior to Admission  Medication Sig Dispense Refill Last Dose   gabapentin (NEURONTIN) 300 MG capsule Take 300 mg by mouth 2 (two) times daily.   Past Week   tiZANidine (ZANAFLEX) 4 MG tablet Take 4 mg by mouth every 6 (six) hours as needed for muscle spasms.   Past Week   acetaminophen (TYLENOL) 500 MG tablet Take 500-1,000 mg by mouth every 6 (six) hours as needed for mild pain or moderate pain.   prn at unknown   DULoxetine (CYMBALTA) 30 MG capsule Take 30-60 mg by mouth 2 (two) times daily. (Patient not taking: Reported on 09/18/2021)   Not Taking  ibuprofen (ADVIL) 200 MG tablet Take 400-600 mg by mouth every 6 (six) hours as needed for mild pain or moderate pain.   prn at unknown   pantoprazole (PROTONIX) 40 MG tablet Take 1 tablet (40 mg total) by mouth daily. 30 tablet 3 prn at unknown   Scheduled:  folic acid  1 mg Oral Daily   LORazepam  0-4 mg Intravenous Q6H   Followed by   Derrill Memo ON 09/20/2021] LORazepam  0-4 mg Intravenous Q12H   multivitamin with minerals  1 tablet Oral  Daily   nicotine  21 mg Transdermal Daily   [START ON 09/21/2021] pantoprazole  40 mg Intravenous Q12H   thiamine  100 mg Oral Daily   Or   thiamine  100 mg Intravenous Daily   Continuous:  sodium chloride     dextrose 5 % and 0.9% NaCl 900 mL (09/18/21 1554)   pantoprazole 8 mg/hr (09/18/21 1136)   [id number] **OR** LORazepam, ondansetron (ZOFRAN) IV, tiZANidine Anti-infectives (From admission, onward)    None      Scheduled Meds:  folic acid  1 mg Oral Daily   LORazepam  0-4 mg Intravenous Q6H   Followed by   Derrill Memo ON 09/20/2021] LORazepam  0-4 mg Intravenous Q12H   multivitamin with minerals  1 tablet Oral Daily   nicotine  21 mg Transdermal Daily   [START ON 09/21/2021] pantoprazole  40 mg Intravenous Q12H   thiamine  100 mg Oral Daily   Or   thiamine  100 mg Intravenous Daily   Continuous Infusions:  sodium chloride     dextrose 5 % and 0.9% NaCl 900 mL (09/18/21 1554)   pantoprazole 8 mg/hr (09/18/21 1136)   PRN Meds:.LORazepam **OR** LORazepam, ondansetron (ZOFRAN) IV, tiZANidine   Assessment: Principal Problem:   GI bleeding Active Problems:   Essential hypertension   Elevated liver function tests   Gastroesophageal reflux disease   Tobacco use disorder   Acute blood loss anemia (ABLA)   Alcohol abuse   Elevated INR   Elevated lactic acid level   Plan: Hematochezia with anemia Patient responded appropriately to blood transfusion Platelet count 29 which is an acute drop since morning, repeat platelet count and transfuse to maintain platelets above 50 I recommend EGD and colonoscopy tomorrow Patient underwent bowel prep yesterday Continue clear liquid diet N.p.o. effective 5 AM tomorrow Monitor CBC closely, maintain platelets above 50, hemoglobin above 7  Alcoholic hepatitis Continue multivitamin, thiamine and folic acid No evidence of coagulopathy, encephalopathy or other signs of acute liver failure   LOS: 0 days   Gabriela White 09/18/2021,  7:11 PM

## 2021-09-18 NOTE — Assessment & Plan Note (Addendum)
Hgb dropped from 9.5 on 09/17/21 to 8.1. pt has INR 3.6. Dr. Allegra Lai of GI is consulted.  - will admitted to progressive bed as inpatient - 2 Units of blood was transfused in ED - 1 unit of frozen plasma is being transfused - f/u CT angiogram GI bleed  - will give 5 mg of Vk by IV - check PTT - IVF: 1L NS bolus, then at D5-NS 75 mL/hr - Start IV pantoprazole gtt - Zofran IV for nausea - Avoid NSAIDs and SQ heparin - Maintain IV access (2 large bore IVs if possible). - Monitor closely and follow q6h cbc, transfuse as necessary, if Hgb<7.0 - hold Ibuprofen

## 2021-09-18 NOTE — ED Notes (Signed)
Sent med message for missing vitamin K.

## 2021-09-18 NOTE — Assessment & Plan Note (Signed)
-   Nicotine patch -Data counseling about importance of quitting tobacco use

## 2021-09-19 ENCOUNTER — Encounter
Admission: EM | Discharge status: Against Medical Advice | Payer: Self-pay | Source: Home / Self Care | Attending: Internal Medicine

## 2021-09-19 ENCOUNTER — Encounter: Payer: Self-pay | Admitting: Internal Medicine

## 2021-09-19 ENCOUNTER — Inpatient Hospital Stay: Payer: PRIVATE HEALTH INSURANCE | Admitting: Anesthesiology

## 2021-09-19 ENCOUNTER — Other Ambulatory Visit: Payer: Self-pay

## 2021-09-19 DIAGNOSIS — K635 Polyp of colon: Secondary | ICD-10-CM

## 2021-09-19 DIAGNOSIS — D696 Thrombocytopenia, unspecified: Secondary | ICD-10-CM

## 2021-09-19 DIAGNOSIS — K259 Gastric ulcer, unspecified as acute or chronic, without hemorrhage or perforation: Secondary | ICD-10-CM | POA: Diagnosis not present

## 2021-09-19 DIAGNOSIS — D62 Acute posthemorrhagic anemia: Secondary | ICD-10-CM | POA: Diagnosis not present

## 2021-09-19 DIAGNOSIS — E871 Hypo-osmolality and hyponatremia: Secondary | ICD-10-CM

## 2021-09-19 HISTORY — PX: COLONOSCOPY WITH PROPOFOL: SHX5780

## 2021-09-19 HISTORY — PX: ESOPHAGOGASTRODUODENOSCOPY: SHX5428

## 2021-09-19 LAB — TYPE AND SCREEN
ABO/RH(D): A POS
Antibody Screen: NEGATIVE
Unit division: 0
Unit division: 0

## 2021-09-19 LAB — BPAM RBC
Blood Product Expiration Date: 202302282359
Blood Product Expiration Date: 202302282359
ISSUE DATE / TIME: 202302080945
ISSUE DATE / TIME: 202302080945
Unit Type and Rh: 9500
Unit Type and Rh: 9500

## 2021-09-19 LAB — GLUCOSE, CAPILLARY: Glucose-Capillary: 100 mg/dL — ABNORMAL HIGH (ref 70–99)

## 2021-09-19 LAB — CBC
HCT: 22.3 % — ABNORMAL LOW (ref 36.0–46.0)
Hemoglobin: 7.7 g/dL — ABNORMAL LOW (ref 12.0–15.0)
MCH: 30.7 pg (ref 26.0–34.0)
MCHC: 34.5 g/dL (ref 30.0–36.0)
MCV: 88.8 fL (ref 80.0–100.0)
Platelets: 126 10*3/uL — ABNORMAL LOW (ref 150–400)
RBC: 2.51 MIL/uL — ABNORMAL LOW (ref 3.87–5.11)
RDW: 21.1 % — ABNORMAL HIGH (ref 11.5–15.5)
WBC: 5.8 10*3/uL (ref 4.0–10.5)
nRBC: 0.3 % — ABNORMAL HIGH (ref 0.0–0.2)

## 2021-09-19 LAB — BASIC METABOLIC PANEL
Anion gap: 5 (ref 5–15)
BUN: <5 mg/dL — ABNORMAL LOW (ref 6–20)
CO2: 22 mmol/L (ref 22–32)
Calcium: 7.6 mg/dL — ABNORMAL LOW (ref 8.9–10.3)
Chloride: 105 mmol/L (ref 98–111)
Creatinine, Ser: 0.53 mg/dL (ref 0.44–1.00)
GFR, Estimated: >60 mL/min (ref >60)
Glucose, Bld: 98 mg/dL (ref 70–99)
Potassium: 3.7 mmol/L (ref 3.5–5.1)
Sodium: 132 mmol/L — ABNORMAL LOW (ref 135–145)

## 2021-09-19 LAB — PREPARE FRESH FROZEN PLASMA: Unit division: 0

## 2021-09-19 LAB — BPAM FFP
Blood Product Expiration Date: 202302132359
ISSUE DATE / TIME: 202302081124
Unit Type and Rh: 6200

## 2021-09-19 LAB — PATHOLOGIST SMEAR REVIEW

## 2021-09-19 SURGERY — EGD (ESOPHAGOGASTRODUODENOSCOPY)
Anesthesia: General

## 2021-09-19 MED ORDER — PROPOFOL 500 MG/50ML IV EMUL
INTRAVENOUS | Status: DC | PRN
  Administered 2021-09-19: 150 ug/kg/min via INTRAVENOUS

## 2021-09-19 MED ORDER — PROPOFOL 10 MG/ML IV BOLUS
INTRAVENOUS | Status: DC | PRN
  Administered 2021-09-19: 20 mg via INTRAVENOUS
  Administered 2021-09-19: 50 mg via INTRAVENOUS

## 2021-09-19 MED ORDER — DEXMEDETOMIDINE (PRECEDEX) IN NS 20 MCG/5ML (4 MCG/ML) IV SYRINGE
PREFILLED_SYRINGE | INTRAVENOUS | Status: DC | PRN
  Administered 2021-09-19: 4 ug via INTRAVENOUS
  Administered 2021-09-19: 8 ug via INTRAVENOUS

## 2021-09-19 MED ORDER — PROPOFOL 500 MG/50ML IV EMUL
INTRAVENOUS | Status: AC
  Filled 2021-09-19: qty 50

## 2021-09-19 MED ORDER — CALCIUM GLUCONATE-NACL 1-0.675 GM/50ML-% IV SOLN
1.0000 g | Freq: Once | INTRAVENOUS | Status: DC
  Filled 2021-09-19: qty 50

## 2021-09-19 MED ORDER — BOOST / RESOURCE BREEZE PO LIQD CUSTOM: 1.0000 | Freq: Three times a day (TID) | ORAL | Status: DC

## 2021-09-19 MED ORDER — SODIUM CHLORIDE 0.9 % IV SOLN: INTRAVENOUS | Status: DC

## 2021-09-19 NOTE — Op Note (Signed)
[facility] Gastroenterology Patient Name: Gabriela White Procedure Date: 09/19/2021 10:52 AM MRN: [id number] Account #: [id number] Date of Birth: 1980/11/25 Admit Type: Inpatient Age: 41 Room: [facility] ENDO ROOM 4 Gender: Female Note Status: Finalized Instrument Name: Upper Endoscope [id number] Procedure:             Upper GI endoscopy Indications:           Acute post hemorrhagic anemia, Hematochezia Providers:             Toney Reil MD, MD Referring MD:          Bari Edward, MD (Referring MD) Medicines:             General Anesthesia Complications:         No immediate complications. Estimated blood loss: None. Procedure:             Pre-Anesthesia Assessment:                        - Prior to the procedure, a History and Physical was                         performed, and patient medications and allergies were                         reviewed. The patient is competent. The risks and                         benefits of the procedure and the sedation options and                         risks were discussed with the patient. All questions                         were answered and informed consent was obtained.                         Patient identification and proposed procedure were                         verified by the physician, the nurse, the                         anesthesiologist, the anesthetist and the technician                         in the pre-procedure area in the procedure room in the                         endoscopy suite. Mental Status Examination: alert and                         oriented. Airway Examination: normal oropharyngeal                         airway and neck mobility. Respiratory Examination:                         clear to auscultation. CV Examination: normal.  Prophylactic Antibiotics: The patient does not require                         prophylactic antibiotics. Prior Anticoagulants: The                          patient has taken no previous anticoagulant or                         antiplatelet agents. ASA Grade Assessment: III - A                         patient with severe systemic disease. After reviewing                         the risks and benefits, the patient was deemed in                         satisfactory condition to undergo the procedure. The                         anesthesia plan was to use general anesthesia.                         Immediately prior to administration of medications,                         the patient was re-assessed for adequacy to receive                         sedatives. The heart rate, respiratory rate, oxygen                         saturations, blood pressure, adequacy of pulmonary                         ventilation, and response to care were monitored                         throughout the procedure. The physical status of the                         patient was re-assessed after the procedure.                        After obtaining informed consent, the endoscope was                         passed under direct vision. Throughout the procedure,                         the patient's blood pressure, pulse, and oxygen                         saturations were monitored continuously. The Endoscope                         was introduced through the mouth, and advanced to the  second part of duodenum. The upper GI endoscopy was                         accomplished without difficulty. The patient tolerated                         the procedure well. Findings:      The duodenal bulb and second portion of the duodenum were normal.      Two localized diminutive erosions with no bleeding and no stigmata of       recent bleeding were found in the gastric antrum.      The cardia and gastric fundus were normal on retroflexion.      The gastroesophageal junction and examined esophagus were normal. Impression:            - Normal duodenal  bulb and second portion of the                         duodenum.                        - Erosive gastropathy with no bleeding and no stigmata                         of recent bleeding.                        - Normal gastroesophageal junction and esophagus.                        - No specimens collected. Recommendation:        - Use Prilosec (omeprazole) 40 mg PO BID for 1 month.                        - Return to my office in 1 month.                        - Proceed with colonoscopy as scheduled                        See colonoscopy report Procedure Code(s):     --- Professional ---                        [id number], Esophagogastroduodenoscopy, flexible,                         transoral; diagnostic, including collection of                         specimen(s) by brushing or washing, when performed                         (separate procedure) Diagnosis Code(s):     --- Professional ---                        K31.89, Other diseases of stomach and duodenum                        D62, Acute posthemorrhagic anemia  K92.1, Melena (includes Hematochezia) CPT copyright 2019 American Medical Association. All rights reserved. The codes documented in this report are preliminary and upon coder review may  be revised to meet current compliance requirements. Dr. Libby Maw Toney Reil MD, MD 09/19/2021 11:19:04 AM This report has been signed electronically. Number of Addenda: 0 Note Initiated On: 09/19/2021 10:52 AM Estimated Blood Loss:  Estimated blood loss: none.      [facility]

## 2021-09-19 NOTE — Progress Notes (Signed)
When nurse entered patient room ,she was already dressed and removed her IV. Patient stated to nurse she was not going to stay in the hospital or sign the AMA form. Nurse did place a pressure dressing over the arm to prevent further blooding. Patient and mother walked out of the room toward the medical mall area.Dr. Maryfrances Bunnell was notified and  made aware of the event that took place.

## 2021-09-19 NOTE — Plan of Care (Signed)
  Problem: Education: Goal: Knowledge of General Education information will improve Description Including pain rating scale, medication(s)/side effects and non-pharmacologic comfort measures Outcome: Progressing   

## 2021-09-19 NOTE — Plan of Care (Signed)

## 2021-09-19 NOTE — Anesthesia Postprocedure Evaluation (Signed)
Anesthesia Post Note  Patient: Gabriela White  Procedure(s) Performed: ESOPHAGOGASTRODUODENOSCOPY (EGD) COLONOSCOPY WITH PROPOFOL  Patient location during evaluation: Endoscopy Anesthesia Type: General Level of consciousness: awake and alert Pain management: pain level controlled Vital Signs Assessment: post-procedure vital signs reviewed and stable Respiratory status: spontaneous breathing, nonlabored ventilation, respiratory function stable and patient connected to nasal cannula oxygen Cardiovascular status: blood pressure returned to baseline and stable Postop Assessment: no apparent nausea or vomiting Anesthetic complications: no   No notable events documented.   Last Vitals:  Vitals:   09/19/21 1146 09/19/21 1156  BP: 113/73 117/78  Pulse: 91 91  Resp: 16 15  Temp:    SpO2: 100% 100%    Last Pain:  Vitals:   09/19/21 1156  TempSrc:   PainSc: 0-No pain                 Cleda Mccreedy Verle Brillhart

## 2021-09-19 NOTE — Anesthesia Preprocedure Evaluation (Signed)
Anesthesia Evaluation  Patient identified by MRN, date of birth, ID band Patient awake    Reviewed: Allergy & Precautions, NPO status , Patient's Chart, lab work & pertinent test results  History of Anesthesia Complications Negative for: history of anesthetic complications  Airway Mallampati: III  TM Distance: >3 FB Neck ROM: full    Dental  (+) Chipped   Pulmonary neg shortness of breath, Current Smoker and Patient abstained from smoking.,    Pulmonary exam normal        Cardiovascular Exercise Tolerance: Good hypertension, Normal cardiovascular exam     Neuro/Psych negative neurological ROS  negative psych ROS   GI/Hepatic Neg liver ROS, GERD  Controlled,  Endo/Other  negative endocrine ROS  Renal/GU negative Renal ROS  negative genitourinary   Musculoskeletal   Abdominal   Peds  Hematology  (+) Blood dyscrasia, anemia ,   Anesthesia Other Findings Patient is NPO appropriate and reports no nausea or vomiting today.  Past Medical History: No date: Hypertension  Past Surgical History: No date: TONSILLECTOMY No date: TUBAL LIGATION  BMI    Body Mass Index: 22.14 kg/m      Reproductive/Obstetrics negative OB ROS                             Anesthesia Physical Anesthesia Plan  ASA: 3  Anesthesia Plan: General   Post-op Pain Management:    Induction: Intravenous  PONV Risk Score and Plan: Propofol infusion and TIVA  Airway Management Planned: Natural Airway and Nasal Cannula  Additional Equipment:   Intra-op Plan:   Post-operative Plan:   Informed Consent: I have reviewed the patients History and Physical, chart, labs and discussed the procedure including the risks, benefits and alternatives for the proposed anesthesia with the patient or authorized representative who has indicated his/her understanding and acceptance.     Dental Advisory Given  Plan Discussed  with: Anesthesiologist, CRNA and Surgeon  Anesthesia Plan Comments: (Patient consented for risks of anesthesia including but not limited to:  - adverse reactions to medications - risk of airway placement if required - damage to eyes, teeth, lips or other oral mucosa - nerve damage due to positioning  - sore throat or hoarseness - Damage to heart, brain, nerves, lungs, other parts of body or loss of life  Patient voiced understanding.)        Anesthesia Quick Evaluation

## 2021-09-19 NOTE — Discharge Summary (Signed)
Physician Discharge Summary   Patient: LESTER LISING MRN: [id number] DOB: 02/13/1981  Admit date:     09/18/2021  Discharge date: 09/19/21  Discharge Physician: Edwin Dada   PCP: Glean Hess, [place] [facility]   Discharge Diagnoses: Principal Problem:   GI bleeding Active Problems:   Acute blood loss anemia   Essential hypertension   Transaminitis   Gastroesophageal reflux disease   Tobacco use disorder   Alcohol abuse   Elevated INR, possible   Elevated lactic acid level   Thrombocytopenia, possible   Coagulopathy, possible   Hyponatremia   Hypocalcemia   Polyp of sigmoid colon   Gastric erosion        Hospital Course: Mrs. Betters is a 41 y.o. F with HTN, smoking and anemia who presented with rectal bleeding.  Patient was hospitalized on 09/16/2021 due to GI bleeding.  Patient was seen by Dr. Marius Ditch on 2/7, planning to do EGD and colonoscopy, but she left the hospital AMA the next morning.   She returned a few hours later with continued rectal bleeding.  Initially had hypotension, required 1L NS and 2 units PRBCs.    Assessment and Plan: * GI bleeding- (present on admission) Hgb dropped from 9.5 on 09/17/21 to 8.1 on admission.  She was given 2 units of blood and 1 unit of fresh frozen plasma.  CT angiogram showed no source of GI bleeding.  She was given vitamin K as she had an unexpected INR 3.6  GI were consulted, the patient underwent EGD and colonoscopy that showed some mild gastric erosions, a single polyp, no likely source of bleeding.  GI suspected that the cause was hemorrhoids.  The patient was recommended to stay in the hospital for monitoring for further bleeding work-up of her elevated INR  After her endoscopy, the patient removed her IV and left the hospital.  She did not wait for nursing to come into the bedside, so I had no opportunity to discuss risks of leaving AGAINST MEDICAL  ADVICE        Hypocalcemia Treated.   Hyponatremia Unclear cause.  Fluids were monitored   Thrombocytopenia and coagulopathy, possible Unclear what this was.  Same with INR.  Her INR the day prior was normal.  One INR here was 3.6, she got vitamin K and the subsequent INRs were normal.  Also, she had a series of hemograms, one of which showed severe thrombocytopenia.  I suspect this was spurious, as all prior and subsequent hemograms showed normal or slightly low (110s) platelets.  If the patient has PCP follow up, this should be repeated.    Elevated lactic acid level- (present on admission) Left facet 3.6, no signs of infection.  No fever or leukocytosis.  Likely due to GI bleeding, dehydration and starvation and alcohol abuse  Alcohol abuse- (present on admission) Tobacco use disorder- (present on admission)  Elevated liver function tests- (present on admission) Patient had abnormal liver function on 2/6 with ALP 230, AST 167, ALT 50, total bilirubin 0.8.  This is mostly due to alcohol abuse. Ultrasound of liver Doppler revealed fatty liver only, no evidence of portal vein thrombosis   Essential hypertension- (present on admission)            DISCHARGE MEDICATION: None, patient left AMA without reconciling medications.  Likely she will resume her prior to admission medications   Discharge Exam: Filed Weights   09/18/21 0920 09/18/21 1520 09/19/21 1054  Weight:  56.7 kg 61.6 kg 56.7 kg   On my exam earlier today: Patient was not adult female, sitting up in bed, in no acute distress. She is oriented to person, place, time, and situation.  She had no tremor, no diaphoresis. Heart rate was normal, respiratory effort normal, lungs sounds normal, no murmurs. No peripheral edema, no abdominal tenderness.  Condition at discharge: Left AMA  The results of significant diagnostics from this hospitalization (including imaging, microbiology, ancillary and laboratory)  are listed below for reference.   Imaging Studies: CT HEAD WO CONTRAST (5MM)  Result Date: 09/16/2021 CLINICAL DATA:  Head trauma, poor oral intake 4 days EXAM: CT HEAD WITHOUT CONTRAST TECHNIQUE: Contiguous axial images were obtained from the base of the skull through the vertex without intravenous contrast. RADIATION DOSE REDUCTION: This exam was performed according to the departmental dose-optimization program which includes automated exposure control, adjustment of the mA and/or kV according to patient size and/or use of iterative reconstruction technique. COMPARISON:  None. FINDINGS: Brain: No evidence of acute infarction, hemorrhage, extra-axial collection, ventriculomegaly, or mass effect. Generalized cerebral atrophy. Periventricular white matter low attenuation likely secondary to microangiopathy. Vascular: No hyperdense vessel. No significant cerebrovascular atherosclerotic disease. Skull: Negative for fracture or focal lesion. Sinuses/Orbits: Visualized portions of the orbits are unremarkable. Visualized portions of the paranasal sinuses are unremarkable. Visualized portions of the mastoid air cells are unremarkable. Other: None. IMPRESSION: 1. No acute intracranial pathology. 2. Generalized cerebral atrophy and microangiopathy. Electronically Signed   By: Kathreen Devoid M.D.   On: 09/16/2021 10:30   DG Chest Portable 1 View  Result Date: 09/18/2021 CLINICAL DATA:  Hypotension EXAM: PORTABLE CHEST 1 VIEW COMPARISON:  None. FINDINGS: The heart size and mediastinal contours are within normal limits. Both lungs are clear. The visualized skeletal structures are unremarkable. IMPRESSION: No active disease. Electronically Signed   By: Yetta Glassman M.D.   On: 09/18/2021 10:29   US LIVER DOPPLER  Result Date: 09/16/2021 CLINICAL DATA:  Abnormal liver function tests EXAM: DUPLEX ULTRASOUND OF LIVER TECHNIQUE: Color and duplex Doppler ultrasound was performed to evaluate the hepatic in-flow and out-flow  vessels. COMPARISON:  None. FINDINGS: Liver: There is increased echogenicity in the liver suggesting fatty infiltration. No focal abnormality is seen in the visualized portions of the liver. Normal hepatic contour without nodularity. No focal lesion, mass or intrahepatic biliary ductal dilatation. Main Portal Vein size: 1.04 cm Portal Vein Velocities Main Prox:  58.5 cm/sec Main Mid: 53.9 cm/sec Main Dist:  51.7 cm/sec Right: 66.7 cm/sec Left: 41.3 cm/sec Hepatic Vein Velocities Right:  21 cm/sec Middle:  21.2 cm/sec Left:  40 cm/sec IVC: Present and patent with normal respiratory phasicity. Hepatic Artery Velocity:  226 cm/sec Splenic Vein Velocity:  26.9 cm/sec Spleen: 6.9 cm x 8.2 cm x 4 cm with a total volume of 116.1 cm^3 (411 cm^3 is upper limit normal) Portal Vein Occlusion/Thrombus: No Splenic Vein Occlusion/Thrombus: No Ascites: None Varices: None IMPRESSION: Fatty liver. Velocity measurements as described in the body of the report. Electronically Signed   By: Elmer Picker M.D.   On: 09/16/2021 15:01   CT ANGIO GI BLEED  Result Date: 09/18/2021 CLINICAL DATA:  GI bleed EXAM: CTA ABDOMEN AND PELVIS WITHOUT AND WITH CONTRAST TECHNIQUE: Multidetector CT imaging of the abdomen and pelvis was performed using the standard protocol during bolus administration of intravenous contrast. Multiplanar reconstructed images and MIPs were obtained and reviewed to evaluate the vascular anatomy. RADIATION DOSE REDUCTION: This exam was performed according to the  departmental dose-optimization program which includes automated exposure control, adjustment of the mA and/or kV according to patient size and/or use of iterative reconstruction technique. CONTRAST:  143mL OMNIPAQUE IOHEXOL 350 MG/ML SOLN COMPARISON:  None. FINDINGS: VASCULAR No evidence of active GI bleed. Scattered areas of hypo high attenuation are seen in the cecum, appendix and rectum which are also present on noncontrast exam, likely due to retained  intraluminal contrast material. Normal caliber abdominal aorta with mild atherosclerotic disease. Major branch vessels are widely patent. Iliofemoral vessels are widely patent. NON-VASCULAR Lower chest: Trace bilateral pleural effusions, Hepatobiliary: Hepatic steatosis. No focal liver lesions. Gallbladder is unremarkable. No biliary ductal dilation. Pancreas: Unremarkable. No pancreatic ductal dilatation or surrounding inflammatory changes. Spleen: Normal in size without focal abnormality. Adrenals/Urinary Tract: Adrenal glands are unremarkable. No hydronephrosis or nephrolithiasis. Tiny bilateral low-attenuation renal lesions which are too small to completely characterize. Bladder is unremarkable. Stomach/Bowel: Gastric wall thickening and mucosal hyperenhancement. Normal appendix. No evidence of obstruction, bowel wall thickening or inflammatory change. Lymphatic: No pathologically enlarged lymph nodes seen in the abdomen or pelvis. Reproductive: Uterus and bilateral adnexa are unremarkable. Other: No abdominal wall hernia or abnormality. Trace pelvic free fluid. Musculoskeletal: No acute or significant osseous findings. IMPRESSION: VASCULAR 1. No evidence of active GI bleed. NON-VASCULAR 1. Wall thickening and mucosal hyperenhancement of the stomach, concerning for gastritis. 2. Trace bilateral pleural effusions and trace pelvic free fluid. 3. Hepatic steatosis. 4. Aortic Atherosclerosis (ICD10-I70.0). Electronically Signed   By: Yetta Glassman M.D.   On: 09/18/2021 12:29    Microbiology: Results for orders placed or performed during the hospital encounter of 09/18/21  Resp Panel by RT-PCR (Flu A&B, Covid) Nasopharyngeal Swab     Status: None   Collection Time: 09/18/21  9:57 AM   Specimen: Nasopharyngeal Swab; Nasopharyngeal(NP) swabs in vial transport medium  Result Value Ref Range Status   SARS Coronavirus 2 by RT PCR NEGATIVE NEGATIVE Final    Comment: (NOTE) SARS-CoV-2 target nucleic acids are  NOT DETECTED.  The SARS-CoV-2 RNA is generally detectable in upper respiratory specimens during the acute phase of infection. The lowest concentration of SARS-CoV-2 viral copies this assay can detect is 138 copies/mL. A negative result does not preclude SARS-Cov-2 infection and should not be used as the sole basis for treatment or other patient management decisions. A negative result may occur with  improper specimen collection/handling, submission of specimen other than nasopharyngeal swab, presence of viral mutation(s) within the areas targeted by this assay, and inadequate number of viral copies(<138 copies/mL). A negative result must be combined with clinical observations, patient history, and epidemiological information. The expected result is Negative.  Fact Sheet for Patients:  EntrepreneurPulse.com.au  Fact Sheet for Healthcare Providers:  IncredibleEmployment.be  This test is no t yet approved or cleared by the [place] FDA and  has been authorized for detection and/or diagnosis of SARS-CoV-2 by FDA under an Emergency Use Authorization (EUA). This EUA will remain  in effect (meaning this test can be used) for the duration of the COVID-19 declaration under Section 564(b)(1) of the Act, 21 U.S.C.section 360bbb-3(b)(1), unless the authorization is terminated  or revoked sooner.       Influenza A by PCR NEGATIVE NEGATIVE Final   Influenza B by PCR NEGATIVE NEGATIVE Final    Comment: (NOTE) The Xpert Xpress SARS-CoV-2/FLU/RSV plus assay is intended as an aid in the diagnosis of influenza from Nasopharyngeal swab specimens and should not be used as a sole basis for treatment. Nasal washings  and aspirates are unacceptable for Xpert Xpress SARS-CoV-2/FLU/RSV testing.  Fact Sheet for Patients: EntrepreneurPulse.com.au  Fact Sheet for Healthcare Providers: IncredibleEmployment.be  This test is not  yet approved or cleared by the [place] FDA and has been authorized for detection and/or diagnosis of SARS-CoV-2 by FDA under an Emergency Use Authorization (EUA). This EUA will remain in effect (meaning this test can be used) for the duration of the COVID-19 declaration under Section 564(b)(1) of the Act, 21 U.S.C. section 360bbb-3(b)(1), unless the authorization is terminated or revoked.  Performed at [facility], [address] [place], [place] [address]     Labs: CBC: Recent Labs  Lab 09/18/21 0458 09/18/21 1140 09/18/21 1732 09/18/21 1941 09/18/21 2203 09/19/21 0547  WBC 6.7 10.8* 7.4  --  6.6 5.8  HGB 8.1* 10.1* 8.7*  --  7.9* 7.7*  HCT 24.6* 30.5* 25.5*  --  22.9* 22.3*  MCV 95.7 92.4 91.1  --  88.4 88.8  PLT 137* 121* 29* 123* 117* 123XX123*   Basic Metabolic Panel: Recent Labs  Lab 09/16/21 0930 09/17/21 0256 09/18/21 0458 09/18/21 1140 09/19/21 0547  NA 136 131* 130* 132* 132*  K 3.7 3.3* 3.8 4.4 3.7  CL 101 99 103 105 105  CO2 25 25 23  20* 22  GLUCOSE 80 82 88 83 98  BUN 8 8 <5* <5* <5*  CREATININE 0.55 0.47 0.53 0.37* 0.53  CALCIUM 7.6* 7.5* 7.8* 7.5* 7.6*  MG 1.7 1.5* 1.9  --   --    Liver Function Tests: Recent Labs  Lab 09/16/21 0930 09/18/21 1140  AST 167* 55*  ALT 50* 29  ALKPHOS 230* 159*  BILITOT 0.7 1.6*  PROT 4.9* 4.1*  ALBUMIN 2.5* 2.2*   CBG: Recent Labs  Lab 09/19/21 0800  GLUCAP 100*    Discharge time spent: 40 minutes.  Signed: Edwin Dada, MD [facility] 09/19/2021

## 2021-09-19 NOTE — Transfer of Care (Signed)
Immediate Anesthesia Transfer of Care Note  Patient: Gabriela White  Procedure(s) Performed: ESOPHAGOGASTRODUODENOSCOPY (EGD) COLONOSCOPY WITH PROPOFOL  Patient Location: PACU and Endoscopy Unit  Anesthesia Type:General  Level of Consciousness: awake, drowsy and patient cooperative  Airway & Oxygen Therapy: Patient Spontanous Breathing  Post-op Assessment: Report given to RN and Post -op Vital signs reviewed and stable  Post vital signs: Reviewed and stable  Last Vitals:  Vitals Value Taken Time  BP 96/67   Temp    Pulse 85 09/19/21 1136  Resp 21 09/19/21 1136  SpO2 98 % 09/19/21 1136  Vitals shown include unvalidated device data.  Last Pain:  Vitals:   09/19/21 1127  TempSrc: Temporal  PainSc:          Complications: No notable events documented.

## 2021-09-19 NOTE — Op Note (Signed)
[facility] Gastroenterology Patient Name: Gabriela White Procedure Date: 09/19/2021 10:51 AM MRN: [id number] Account #: [id number] Date of Birth: 21-Aug-1980 Admit Type: Inpatient Age: 41 Room: [facility] ENDO ROOM 4 Gender: Female Note Status: Finalized Instrument Name: Colonoscope [id number] Procedure:             Colonoscopy Indications:           This is the patient's first colonoscopy, Hematochezia,                         Acute post hemorrhagic anemia Providers:             Toney Reil MD, MD Referring MD:          Bari Edward, MD (Referring MD) Medicines:             General Anesthesia Complications:         No immediate complications. Estimated blood loss: None. Procedure:             Pre-Anesthesia Assessment:                        - Prior to the procedure, a History and Physical was                         performed, and patient medications and allergies were                         reviewed. The patient is competent. The risks and                         benefits of the procedure and the sedation options and                         risks were discussed with the patient. All questions                         were answered and informed consent was obtained.                         Patient identification and proposed procedure were                         verified by the physician, the nurse, the                         anesthesiologist, the anesthetist and the technician                         in the pre-procedure area in the procedure room in the                         endoscopy suite. Mental Status Examination: alert and                         oriented. Airway Examination: normal oropharyngeal                         airway and neck mobility. Respiratory Examination:  clear to auscultation. CV Examination: normal.                         Prophylactic Antibiotics: The patient does not require                          prophylactic antibiotics. Prior Anticoagulants: The                         patient has taken no previous anticoagulant or                         antiplatelet agents. ASA Grade Assessment: III - A                         patient with severe systemic disease. After reviewing                         the risks and benefits, the patient was deemed in                         satisfactory condition to undergo the procedure. The                         anesthesia plan was to use general anesthesia.                         Immediately prior to administration of medications,                         the patient was re-assessed for adequacy to receive                         sedatives. The heart rate, respiratory rate, oxygen                         saturations, blood pressure, adequacy of pulmonary                         ventilation, and response to care were monitored                         throughout the procedure. The physical status of the                         patient was re-assessed after the procedure.                        After obtaining informed consent, the colonoscope was                         passed under direct vision. Throughout the procedure,                         the patient's blood pressure, pulse, and oxygen                         saturations were monitored continuously. The  Colonoscope was introduced through the anus and                         advanced to the the terminal ileum, with                         identification of the appendiceal orifice and IC                         valve. The colonoscopy was performed without                         difficulty. The patient tolerated the procedure well.                         The quality of the bowel preparation was poor. Findings:      The perianal and digital rectal examinations were normal. Pertinent       negatives include normal sphincter tone and no palpable rectal lesions.      The terminal  ileum appeared normal.      Brown stool in the entire colon      A 5 mm polyp was found in the sigmoid colon. The polyp was sessile. The       polyp was removed with a cold snare. Resection and retrieval were       complete. Estimated blood loss: none.      Non-bleeding internal external and internal hemorrhoids were found       during retroflexion. The hemorrhoids were small.      The exam was otherwise without abnormality. Impression:            - Preparation of the colon was poor.                        - The examined portion of the ileum was normal.                        - One 5 mm polyp in the sigmoid colon, removed with a                         cold snare. Resected and retrieved.                        - Non-bleeding internal external and internal                         hemorrhoids.                        - The examination was otherwise normal. Recommendation:        - Return patient to hospital ward for ongoing care.                        - Resume regular diet today.                        - Continue present medications.                        - Return to my office in 1 month.                        -  Await pathology results. Procedure Code(s):     --- Professional ---                        [phone], Colonoscopy, flexible; with removal of                         tumor(s), polyp(s), or other lesion(s) by snare                         technique Diagnosis Code(s):     --- Professional ---                        K64.8, Other hemorrhoids                        K63.5, Polyp of colon                        K92.1, Melena (includes Hematochezia)                        D62, Acute posthemorrhagic anemia CPT copyright 2019 American [employer] Association. All rights reserved. The codes documented in this report are preliminary and upon coder review may  be revised to meet current compliance requirements. Dr. Libby Maw Toney Reil MD, MD 09/19/2021 11:34:34 AM This report has been  signed electronically. Number of Addenda: 0 Note Initiated On: 09/19/2021 10:51 AM Scope Withdrawal Time: 0 hours 5 minutes 56 seconds  Total Procedure Duration: 0 hours 10 minutes 0 seconds  Estimated Blood Loss:  Estimated blood loss: none.      [facility]

## 2021-09-19 NOTE — Discharge Summary (Signed)
Physician Discharge Summary   Patient: Gabriela White MRN: [id number] DOB: 1981/01/27  Admit date:     09/16/2021  Discharge date: 09/18/2021  Discharge Physician: Arnetha Courser   PCP: Reubin Milan, MD   Recommendations at discharge:  {Discharge Diagnoses: Principal Problem:   Acute blood loss anemia Active Problems:   Essential hypertension   Tobacco use disorder   Alcohol abuse   Alcoholic gastritis  Resolved Problems:   * No resolved hospital problems. [facility] Course: Patient left AMA.  I never saw the patient.  Assessment and Plan:    DISCHARGE MEDICATION: Allergies as of 09/18/2021       Reactions   Morphine Nausea And Vomiting        Medication List     ASK your doctor about these medications    acetaminophen 500 MG tablet Commonly known as: TYLENOL Take 500-1,000 mg by mouth every 6 (six) hours as needed for mild pain or moderate pain.   ibuprofen 200 MG tablet Commonly known as: ADVIL Take 400-600 mg by mouth every 6 (six) hours as needed for mild pain or moderate pain.   pantoprazole 40 MG tablet Commonly known as: PROTONIX Take 1 tablet (40 mg total) by mouth daily.   tiZANidine 4 MG tablet Commonly known as: ZANAFLEX Take 4 mg by mouth every 6 (six) hours as needed for muscle spasms.         Discharge Exam: Filed Weights   09/16/21 0926  Weight: 56.7 kg   Patient was never seen or examined by me.   The results of significant diagnostics from this hospitalization (including imaging, microbiology, ancillary and laboratory) are listed below for reference.   Imaging Studies: CT HEAD WO CONTRAST ( )  Result Date: 09/16/2021 CLINICAL DATA:  Head trauma, poor oral intake 4 days EXAM: CT HEAD WITHOUT CONTRAST TECHNIQUE: Contiguous axial images were obtained from the base of the skull through the vertex without intravenous contrast. RADIATION DOSE REDUCTION: This exam was performed according to the departmental dose-optimization  program which includes automated exposure control, adjustment of the mA and/or kV according to patient size and/or use of iterative reconstruction technique. COMPARISON:  None. FINDINGS: Brain: No evidence of acute infarction, hemorrhage, extra-axial collection, ventriculomegaly, or mass effect. Generalized cerebral atrophy. Periventricular white matter low attenuation likely secondary to microangiopathy. Vascular: No hyperdense vessel. No significant cerebrovascular atherosclerotic disease. Skull: Negative for fracture or focal lesion. Sinuses/Orbits: Visualized portions of the orbits are unremarkable. Visualized portions of the paranasal sinuses are unremarkable. Visualized portions of the mastoid air cells are unremarkable. Other: None. IMPRESSION: 1. No acute intracranial pathology. 2. Generalized cerebral atrophy and microangiopathy. Electronically Signed   By: Elige Ko M.D.   On: 09/16/2021 10:30   DG Chest Portable 1 View  Result Date: 09/18/2021 CLINICAL DATA:  Hypotension EXAM: PORTABLE CHEST 1 VIEW COMPARISON:  None. FINDINGS: The heart size and mediastinal contours are within normal limits. Both lungs are clear. The visualized skeletal structures are unremarkable. IMPRESSION: No active disease. Electronically Signed   By: Allegra Lai M.D.   On: 09/18/2021 10:29   US LIVER DOPPLER  Result Date: 09/16/2021 CLINICAL DATA:  Abnormal liver function tests EXAM: DUPLEX ULTRASOUND OF LIVER TECHNIQUE: Color and duplex Doppler ultrasound was performed to evaluate the hepatic in-flow and out-flow vessels. COMPARISON:  None. FINDINGS: Liver: There is increased echogenicity in the liver suggesting fatty infiltration. No focal abnormality is seen in the visualized portions of the liver. Normal hepatic contour without nodularity.  No focal lesion, mass or intrahepatic biliary ductal dilatation. Main Portal Vein size: 1.04 cm Portal Vein Velocities Main Prox:  58.5 cm/sec Main Mid: 53.9 cm/sec Main Dist:   51.7 cm/sec Right: 66.7 cm/sec Left: 41.3 cm/sec Hepatic Vein Velocities Right:  21 cm/sec Middle:  21.2 cm/sec Left:  40 cm/sec IVC: Present and patent with normal respiratory phasicity. Hepatic Artery Velocity:  226 cm/sec Splenic Vein Velocity:  26.9 cm/sec Spleen: 6.9 cm x 8.2 cm x 4 cm with a total volume of 116.1 cm^3 (411 cm^3 is upper limit normal) Portal Vein Occlusion/Thrombus: No Splenic Vein Occlusion/Thrombus: No Ascites: None Varices: None IMPRESSION: Fatty liver. Velocity measurements as described in the body of the report. Electronically Signed   By: Ernie Avena M.D.   On: 09/16/2021 15:01   CT ANGIO GI BLEED  Result Date: 09/18/2021 CLINICAL DATA:  GI bleed EXAM: CTA ABDOMEN AND PELVIS WITHOUT AND WITH CONTRAST TECHNIQUE: Multidetector CT imaging of the abdomen and pelvis was performed using the standard protocol during bolus administration of intravenous contrast. Multiplanar reconstructed images and MIPs were obtained and reviewed to evaluate the vascular anatomy. RADIATION DOSE REDUCTION: This exam was performed according to the departmental dose-optimization program which includes automated exposure control, adjustment of the mA and/or kV according to patient size and/or use of iterative reconstruction technique. CONTRAST:  OMNIPAQUE IOHEXOL 350 MG/ML SOLN COMPARISON:  None. FINDINGS: VASCULAR No evidence of active GI bleed. Scattered areas of hypo high attenuation are seen in the cecum, appendix and rectum which are also present on noncontrast exam, likely due to retained intraluminal contrast material. Normal caliber abdominal aorta with mild atherosclerotic disease. Major branch vessels are widely patent. Iliofemoral vessels are widely patent. NON-VASCULAR Lower chest: Trace bilateral pleural effusions, Hepatobiliary: Hepatic steatosis. No focal liver lesions. Gallbladder is unremarkable. No biliary ductal dilation. Pancreas: Unremarkable. No pancreatic ductal dilatation or  surrounding inflammatory changes. Spleen: Normal in size without focal abnormality. Adrenals/Urinary Tract: Adrenal glands are unremarkable. No hydronephrosis or nephrolithiasis. Tiny bilateral low-attenuation renal lesions which are too small to completely characterize. Bladder is unremarkable. Stomach/Bowel: Gastric wall thickening and mucosal hyperenhancement. Normal appendix. No evidence of obstruction, bowel wall thickening or inflammatory change. Lymphatic: No pathologically enlarged lymph nodes seen in the abdomen or pelvis. Reproductive: Uterus and bilateral adnexa are unremarkable. Other: No abdominal wall hernia or abnormality. Trace pelvic free fluid. Musculoskeletal: No acute or significant osseous findings. IMPRESSION: VASCULAR 1. No evidence of active GI bleed. NON-VASCULAR 1. Wall thickening and mucosal hyperenhancement of the stomach, concerning for gastritis. 2. Trace bilateral pleural effusions and trace pelvic free fluid. 3. Hepatic steatosis. 4. Aortic Atherosclerosis (ICD10-I70.0). Electronically Signed   By: Allegra Lai M.D.   On: 09/18/2021 12:29    Microbiology: Results for orders placed or performed during the hospital encounter of 09/16/21  Resp Panel by RT-PCR (Flu A&B, Covid) Nasopharyngeal Swab     Status: None   Collection Time: 09/16/21  9:37 AM   Specimen: Nasopharyngeal Swab; Nasopharyngeal(NP) swabs in vial transport medium  Result Value Ref Range Status   SARS Coronavirus 2 by RT PCR NEGATIVE NEGATIVE Final    Comment: (NOTE) SARS-CoV-2 target nucleic acids are NOT DETECTED.  The SARS-CoV-2 RNA is generally detectable in upper respiratory specimens during the acute phase of infection. The lowest concentration of SARS-CoV-2 viral copies this assay can detect is 138 copies/mL. A negative result does not preclude SARS-Cov-2 infection and should not be used as the sole basis for treatment or other patient  management decisions. A negative result may occur with   improper specimen collection/handling, submission of specimen other than nasopharyngeal swab, presence of viral mutation(s) within the areas targeted by this assay, and inadequate number of viral copies(<138 copies/mL). A negative result must be combined with clinical observations, patient history, and epidemiological information. The expected result is Negative.  Fact Sheet for Patients:  BloggerCourse.com  Fact Sheet for Healthcare Providers:  SeriousBroker.it  This test is no t yet approved or cleared by the [place] FDA and  has been authorized for detection and/or diagnosis of SARS-CoV-2 by FDA under an Emergency Use Authorization (EUA). This EUA will remain  in effect (meaning this test can be used) for the duration of the COVID-19 declaration under Section 564(b)(1) of the Act, 21 U.S.C.section 360bbb-3(b)(1), unless the authorization is terminated  or revoked sooner.       Influenza A by PCR NEGATIVE NEGATIVE Final   Influenza B by PCR NEGATIVE NEGATIVE Final    Comment: (NOTE) The Xpert Xpress SARS-CoV-2/FLU/RSV plus assay is intended as an aid in the diagnosis of influenza from Nasopharyngeal swab specimens and should not be used as a sole basis for treatment. Nasal washings and aspirates are unacceptable for Xpert Xpress SARS-CoV-2/FLU/RSV testing.  Fact Sheet for Patients: BloggerCourse.com  Fact Sheet for Healthcare Providers: SeriousBroker.it  This test is not yet approved or cleared by the [place] FDA and has been authorized for detection and/or diagnosis of SARS-CoV-2 by FDA under an Emergency Use Authorization (EUA). This EUA will remain in effect (meaning this test can be used) for the duration of the COVID-19 declaration under Section 564(b)(1) of the Act, 21 U.S.C. section 360bbb-3(b)(1), unless the authorization is terminated  or revoked.  Performed at [facility], [address] [place], [place] [address]     Labs: CBC: Recent Labs  Lab 09/18/21 0458 09/18/21 1140 09/18/21 1732 09/18/21 1941 09/18/21 2203 09/19/21 0547  WBC 6.7 10.8* 7.4  --  6.6 5.8  HGB 8.1* 10.1* 8.7*  --  7.9* 7.7*  HCT 24.6* 30.5* 25.5*  --  22.9* 22.3*  MCV 95.7 92.4 91.1  --  88.4 88.8  PLT 137* 121* 29* 123* 117* 126*   Basic Metabolic Panel: Recent Labs  Lab 09/16/21 0930 09/17/21 0256 09/18/21 0458 09/18/21 1140 09/19/21 0547  NA 136 131* 130* 132* 132*  K 3.7 3.3* 3.8 4.4 3.7  CL 101 99 103 105 105  CO2 25 25 23  20* 22  GLUCOSE 80 82 88 83 98  BUN 8 8 <5* <5* <5*  CREATININE 0.55 0.47 0.53 0.37* 0.53  CALCIUM 7.6* 7.5* 7.8* 7.5* 7.6*  MG 1.7 1.5* 1.9  --   --    Liver Function Tests: Recent Labs  Lab 09/16/21 0930 09/18/21 1140  AST 167* 55*  ALT 50* 29  ALKPHOS 230* 159*  BILITOT 0.7 1.6*  PROT 4.9* 4.1*  ALBUMIN 2.5* 2.2*   CBG: Recent Labs  Lab 09/19/21 0800  GLUCAP 100*    Discharge time spent: Patient left AMA. Signed: 11/17/21, MD [facility] 09/19/2021

## 2021-09-20 ENCOUNTER — Encounter: Payer: Self-pay | Admitting: Gastroenterology

## 2021-09-20 LAB — SURGICAL PATHOLOGY

## 2021-09-24 LAB — PREPARE RBC (CROSSMATCH)

## 2023-02-11 ENCOUNTER — Inpatient Hospital Stay: Admit: 2023-02-11 | Payer: PRIVATE HEALTH INSURANCE | Admitting: Internal Medicine

## 2023-02-11 ENCOUNTER — Inpatient Hospital Stay
Admission: EM | Admit: 2023-02-11 | Discharge: 2023-02-12 | DRG: 101 | Disposition: A | Discharge status: Home / Self Care | Payer: PRIVATE HEALTH INSURANCE | Attending: Internal Medicine | Admitting: Internal Medicine

## 2023-02-11 ENCOUNTER — Encounter (HOSPITAL_COMMUNITY): Payer: Self-pay

## 2023-02-11 ENCOUNTER — Ambulatory Visit: Payer: PRIVATE HEALTH INSURANCE

## 2023-02-11 ENCOUNTER — Other Ambulatory Visit: Payer: Self-pay

## 2023-02-11 ENCOUNTER — Emergency Department: Payer: PRIVATE HEALTH INSURANCE

## 2023-02-11 DIAGNOSIS — E871 Hypo-osmolality and hyponatremia: Secondary | ICD-10-CM | POA: Diagnosis present

## 2023-02-11 DIAGNOSIS — Z833 Family history of diabetes mellitus: Secondary | ICD-10-CM

## 2023-02-11 DIAGNOSIS — Z8249 Family history of ischemic heart disease and other diseases of the circulatory system: Secondary | ICD-10-CM

## 2023-02-11 DIAGNOSIS — Z79899 Other long term (current) drug therapy: Secondary | ICD-10-CM

## 2023-02-11 DIAGNOSIS — Y900 Blood alcohol level of less than 20 mg/100 ml: Secondary | ICD-10-CM | POA: Diagnosis present

## 2023-02-11 DIAGNOSIS — F1010 Alcohol abuse, uncomplicated: Secondary | ICD-10-CM | POA: Diagnosis present

## 2023-02-11 DIAGNOSIS — R569 Unspecified convulsions: Secondary | ICD-10-CM | POA: Diagnosis not present

## 2023-02-11 DIAGNOSIS — I10 Essential (primary) hypertension: Secondary | ICD-10-CM | POA: Diagnosis present

## 2023-02-11 DIAGNOSIS — F17210 Nicotine dependence, cigarettes, uncomplicated: Secondary | ICD-10-CM | POA: Diagnosis present

## 2023-02-11 DIAGNOSIS — R4182 Altered mental status, unspecified: Secondary | ICD-10-CM

## 2023-02-11 DIAGNOSIS — G9381 Temporal sclerosis: Secondary | ICD-10-CM

## 2023-02-11 DIAGNOSIS — K219 Gastro-esophageal reflux disease without esophagitis: Secondary | ICD-10-CM | POA: Diagnosis present

## 2023-02-11 DIAGNOSIS — G40109 Localization-related (focal) (partial) symptomatic epilepsy and epileptic syndromes with simple partial seizures, not intractable, without status epilepticus: Principal | ICD-10-CM | POA: Diagnosis present

## 2023-02-11 DIAGNOSIS — Z885 Allergy status to narcotic agent status: Secondary | ICD-10-CM | POA: Diagnosis not present

## 2023-02-11 DIAGNOSIS — F05 Delirium due to known physiological condition: Secondary | ICD-10-CM

## 2023-02-11 DIAGNOSIS — F17200 Nicotine dependence, unspecified, uncomplicated: Secondary | ICD-10-CM | POA: Diagnosis present

## 2023-02-11 HISTORY — DX: Unspecified convulsions: R56.9

## 2023-02-11 LAB — AMMONIA: Ammonia: 41 umol/L — ABNORMAL HIGH (ref 9–35)

## 2023-02-11 LAB — CBC
HCT: 35.1 % — ABNORMAL LOW (ref 36.0–46.0)
HCT: 34.5 % — ABNORMAL LOW (ref 36.0–46.0)
Hemoglobin: 12.3 g/dL (ref 12.0–15.0)
Hemoglobin: 12.2 g/dL (ref 12.0–15.0)
MCH: 30.4 pg (ref 26.0–34.0)
MCH: 30.7 pg (ref 26.0–34.0)
MCHC: 35 g/dL (ref 30.0–36.0)
MCHC: 35.4 g/dL (ref 30.0–36.0)
MCV: 86.9 fL (ref 80.0–100.0)
MCV: 86.7 fL (ref 80.0–100.0)
Platelets: 216 10*3/uL (ref 150–400)
Platelets: 198 10*3/uL (ref 150–400)
RBC: 4.04 MIL/uL (ref 3.87–5.11)
RBC: 3.98 MIL/uL (ref 3.87–5.11)
RDW: 14.4 % (ref 11.5–15.5)
RDW: 14.6 % (ref 11.5–15.5)
WBC: 12.2 10*3/uL — ABNORMAL HIGH (ref 4.0–10.5)
WBC: 7.8 10*3/uL (ref 4.0–10.5)
nRBC: 0 % (ref 0.0–0.2)
nRBC: 0 % (ref 0.0–0.2)

## 2023-02-11 LAB — MAGNESIUM: Magnesium: 1.9 mg/dL (ref 1.7–2.4)

## 2023-02-11 LAB — URINALYSIS, COMPLETE (UACMP) WITH MICROSCOPIC
Bacteria, UA: NONE SEEN
Bilirubin Urine: NEGATIVE
Glucose, UA: NEGATIVE mg/dL
Hgb urine dipstick: NEGATIVE
Ketones, ur: 5 mg/dL — AB
Leukocytes,Ua: NEGATIVE
Nitrite: NEGATIVE
Protein, ur: NEGATIVE mg/dL
Specific Gravity, Urine: 1.006 (ref 1.005–1.030)
pH: 7 (ref 5.0–8.0)

## 2023-02-11 LAB — URINE DRUG SCREEN, QUALITATIVE (ARMC ONLY)
Amphetamines, Ur Screen: NOT DETECTED
Barbiturates, Ur Screen: NOT DETECTED
Benzodiazepine, Ur Scrn: POSITIVE — AB
Cannabinoid 50 Ng, Ur ~~LOC~~: POSITIVE — AB
Cocaine Metabolite,Ur ~~LOC~~: NOT DETECTED
MDMA (Ecstasy)Ur Screen: NOT DETECTED
Methadone Scn, Ur: NOT DETECTED
Opiate, Ur Screen: NOT DETECTED
Phencyclidine (PCP) Ur S: NOT DETECTED
Tricyclic, Ur Screen: NOT DETECTED

## 2023-02-11 LAB — TSH: TSH: 1.52 u[IU]/mL (ref 0.350–4.500)

## 2023-02-11 LAB — COMPREHENSIVE METABOLIC PANEL
ALT: 17 U/L (ref 0–44)
AST: 29 U/L (ref 15–41)
Albumin: 4.7 g/dL (ref 3.5–5.0)
Alkaline Phosphatase: 56 U/L (ref 38–126)
Anion gap: 12 (ref 5–15)
BUN: 6 mg/dL (ref 6–20)
CO2: 21 mmol/L — ABNORMAL LOW (ref 22–32)
Calcium: 9.7 mg/dL (ref 8.9–10.3)
Chloride: 96 mmol/L — ABNORMAL LOW (ref 98–111)
Creatinine, Ser: 0.49 mg/dL (ref 0.44–1.00)
GFR, Estimated: >60 mL/min (ref >60)
Glucose, Bld: 97 mg/dL (ref 70–99)
Potassium: 4.7 mmol/L (ref 3.5–5.1)
Sodium: 129 mmol/L — ABNORMAL LOW (ref 135–145)
Total Bilirubin: 1.1 mg/dL (ref 0.3–1.2)
Total Protein: 7.7 g/dL (ref 6.5–8.1)

## 2023-02-11 LAB — VITAMIN B12: Vitamin B-12: 883 pg/mL (ref 180–914)

## 2023-02-11 LAB — ETHANOL: Alcohol, Ethyl (B): <10 mg/dL (ref <10)

## 2023-02-11 LAB — MRSA NEXT GEN BY PCR, NASAL: MRSA by PCR Next Gen: NOT DETECTED

## 2023-02-11 LAB — CK: Total CK: 71 U/L (ref 38–234)

## 2023-02-11 LAB — CREATININE, SERUM
Creatinine, Ser: 0.51 mg/dL (ref 0.44–1.00)
GFR, Estimated: >60 mL/min (ref >60)

## 2023-02-11 LAB — HIV ANTIBODY (ROUTINE TESTING W REFLEX): HIV Screen 4th Generation wRfx: NONREACTIVE

## 2023-02-11 LAB — VALPROIC ACID LEVEL: Valproic Acid Lvl: 108 ug/mL — ABNORMAL HIGH (ref 50.0–100.0)

## 2023-02-11 MED ORDER — CHLORHEXIDINE GLUCONATE CLOTH 2 % EX PADS
6.0000 | MEDICATED_PAD | Freq: Every day | CUTANEOUS | Status: DC
  Administered 2023-02-11 – 2023-02-12 (×2): 6 via TOPICAL

## 2023-02-11 MED ORDER — SODIUM CHLORIDE 0.9 % IV BOLUS: 1000.0000 mL | Freq: Once | INTRAVENOUS | Status: DC

## 2023-02-11 MED ORDER — POLYETHYLENE GLYCOL 3350 17 G PO PACK: 17.0000 g | PACK | Freq: Every day | ORAL | Status: DC | PRN

## 2023-02-11 MED ORDER — CLOBAZAM 5 MG PO HALF TABLET
5.0000 mg | ORAL_TABLET | Freq: Two times a day (BID) | ORAL | Status: DC
  Administered 2023-02-11 (×2): 5 mg via ORAL
  Filled 2023-02-11 (×4): qty 1

## 2023-02-11 MED ORDER — CLOBAZAM 2.5 MG/ML PO SUSP
5.0000 mg | Freq: Two times a day (BID) | ORAL | Status: DC
  Filled 2023-02-11: qty 4

## 2023-02-11 MED ORDER — DOCUSATE SODIUM 100 MG PO CAPS
100.0000 mg | ORAL_CAPSULE | Freq: Two times a day (BID) | ORAL | Status: DC
  Administered 2023-02-11 – 2023-02-12 (×2): 100 mg via ORAL
  Filled 2023-02-11 (×2): qty 1

## 2023-02-11 MED ORDER — PANTOPRAZOLE SODIUM 40 MG IV SOLR
40.0000 mg | Freq: Two times a day (BID) | INTRAVENOUS | Status: DC
  Administered 2023-02-11 – 2023-02-12 (×2): 40 mg via INTRAVENOUS
  Filled 2023-02-11 (×2): qty 10

## 2023-02-11 MED ORDER — LACOSAMIDE 50 MG PO TABS
200.0000 mg | ORAL_TABLET | Freq: Two times a day (BID) | ORAL | Status: DC
  Administered 2023-02-11 – 2023-02-12 (×3): 200 mg via ORAL
  Filled 2023-02-11 (×3): qty 4

## 2023-02-11 MED ORDER — THIAMINE HCL 100 MG/ML IJ SOLN: 100.0000 mg | Freq: Every day | INTRAMUSCULAR | Status: DC

## 2023-02-11 MED ORDER — LORAZEPAM 2 MG/ML IJ SOLN
1.0000 mg | Freq: Once | INTRAMUSCULAR | Status: AC
  Administered 2023-02-11: 1 mg via INTRAMUSCULAR

## 2023-02-11 MED ORDER — LACTATED RINGERS IV SOLN: INTRAVENOUS | Status: DC

## 2023-02-11 MED ORDER — THIAMINE HCL 100 MG/ML IJ SOLN: 250.0000 mg | Freq: Every day | INTRAVENOUS | Status: DC

## 2023-02-11 MED ORDER — LORAZEPAM 2 MG/ML IJ SOLN
1.0000 mg | Freq: Once | INTRAMUSCULAR | Status: DC
  Filled 2023-02-11: qty 1

## 2023-02-11 MED ORDER — ACETAMINOPHEN 325 MG PO TABS
650.0000 mg | ORAL_TABLET | ORAL | Status: DC | PRN
  Administered 2023-02-11: 650 mg via ORAL
  Filled 2023-02-11: qty 2

## 2023-02-11 MED ORDER — DIVALPROEX SODIUM 500 MG PO DR TAB
1000.0000 mg | DELAYED_RELEASE_TABLET | Freq: Two times a day (BID) | ORAL | Status: DC
  Administered 2023-02-11 – 2023-02-12 (×3): 1000 mg via ORAL
  Filled 2023-02-11 (×3): qty 2

## 2023-02-11 MED ORDER — THIAMINE HCL 100 MG/ML IJ SOLN
500.0000 mg | Freq: Three times a day (TID) | INTRAVENOUS | Status: DC
  Administered 2023-02-11 – 2023-02-12 (×3): 500 mg via INTRAVENOUS
  Filled 2023-02-11 (×5): qty 5

## 2023-02-11 MED ORDER — ACETAMINOPHEN 650 MG RE SUPP: 650.0000 mg | RECTAL | Status: DC | PRN

## 2023-02-11 MED ORDER — HEPARIN SODIUM (PORCINE) 5000 UNIT/ML IJ SOLN
5000.0000 [IU] | Freq: Three times a day (TID) | INTRAMUSCULAR | Status: DC
  Administered 2023-02-11 – 2023-02-12 (×2): 5000 [IU] via SUBCUTANEOUS
  Filled 2023-02-11 (×2): qty 1

## 2023-02-11 MED ORDER — MIDAZOLAM HCL (PF) 10 MG/2ML IJ SOLN
10.0000 mg | Freq: Once | INTRAMUSCULAR | Status: AC
  Administered 2023-02-11: 10 mg via INTRAMUSCULAR
  Filled 2023-02-11: qty 2

## 2023-02-11 MED ORDER — ORAL CARE MOUTH RINSE: 15.0000 mL | OROMUCOSAL | Status: DC | PRN
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
## 2023-02-11 NOTE — ED Notes (Signed)
ED Provider at bedside. 

## 2023-02-11 NOTE — ED Provider Notes (Signed)
Procedures     ----------------------------------------- 5:48 PM on 02/11/2023 ----------------------------------------- After several hours of ED observation while awaiting transfer bed assignment and transport team arrival, patient returned to normal baseline around 5:00 PM.  This is confirmed by the patient's spouse at bedside.  Patient was able to ambulate to the bathroom with a steady gait.  She is alert and oriented.  Tolerating oral intake.  Updated Dr. Iver Nestle who advises that the patient does not require transfer to [facility] at this point and can be observed overnight at [facility] continued on AEDs, and reevaluated tomorrow.  Patient and spouse are comfortable with this change in the plan as well.  [facility] team arrived as this change in plan was being determined, so I relayed to them the transverse no longer needed.  I asked secretary to cancel the transfer with [facility] transfer center.     Sharman Cheek, MD 02/11/23 [phone]

## 2023-02-11 NOTE — ED Triage Notes (Signed)
Per EMS pt has a hx of seizures and pt is currently shaking all over and answering questions inappropriately "they just recently got it in my [place]" when asked about an IV site that works for her. pts husband told EMS he is unsure if she took her seizure medications. With moments of lucidity and appropriate responses.

## 2023-02-11 NOTE — ED Notes (Signed)
Pharmacy contacted to have vimpat tubed since Ed pyxis is currently being worked on

## 2023-02-11 NOTE — Care Plan (Signed)
Plan of Care Note for accepted transfer   Patient: Gabriela White MRN: [id number]   DOA: 02/11/2023  Facility requesting transfer: [facility] Requesting Provider: Dr Marisa Severin Reason for transfer: Seizures needing continuous EEG per neurology Facility course: Patient is a 43 year old female history of seizure disorder, hypertension, alcoholism presented to the ED with concerns for possible seizures.  Patient noted by ED physician arrival to be anxious, shaking of arms and feet and answering some questions noted to have a stuttering his speech.  Patient noted to have had 2 generalized tonic-clonic seizures 3 days prior to admission and noted to have remained confused over the past 2 days post seizures.  Patient also noted to have some alcohol intake.  Patient brought to the ED by family due to worsening mental status.  Patient seen by neurology in the ED underwent a spot EEG.  Patient given IV Versed and onfi in the ED.  Neurology recommended transfer to tertiary care center for continuous EEG and further evaluation due to concern for status epilepticus.  As patient's care taking care of at [facility], ED physician tried transferring patient to [facility] however [facility] at capacity and unable to take patient and as such patient transferred to [facility].  Plan of care: The patient is accepted for admission to Telemetry unit, at [facility]   Author: Ramiro Harvest, MD 02/11/2023  Check [email or web address] for on-call coverage.  Nursing staff, Please call [facility] Admits & Consults System-Wide number on Amion as soon as patient's arrival, so appropriate admitting provider can evaluate the pt.

## 2023-02-11 NOTE — Procedures (Signed)
Patient Name: Gabriela White  MRN: [id number]  Epilepsy Attending: Charlsie Quest  Referring Physician/Provider: Gordy Councilman, MD  Date: 02/11/2023 Duration: 46.05 mins  Patient history:  42 y.o. 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 EEG to evaluate for seizure  Level of alertness: Awake, asleep  AEDs during EEG study: VPA, LCM, Onfi  Technical aspects: This EEG study was done with scalp electrodes positioned according to the 10-20 International system of electrode placement. Electrical activity was reviewed with band pass filter of 1-70Hz , sensitivity of 7 uV/mm, display speed of 76mm/sec with a 60Hz  notched filter applied as appropriate. EEG data were recorded continuously and digitally stored.  Video monitoring was available and reviewed as appropriate.  Description: The posterior dominant rhythm consists of 9-10 Hz activity of moderate voltage (25-35 uV) seen predominantly in posterior head regions, symmetric and reactive to eye opening and eye closing. Sleep was characterized by vertex waves, sleep spindles (12 to 14 Hz), maximal frontocentral region. EEG showed intermittent sharply contoured 3 to 6 Hz theta-delta slowing in left and right temporal region. Spikes were noted in right temporal region. Hyperventilation and photic stimulation were not performed.     ABNORMALITY - Spike, right temporal region - Intermittent slow,  left and right temporal region.  IMPRESSION: This study is consistent with patient's history of epilepsy arising from right temporal region. Additionally there is cortical dysfunction arising from left and right temporal region. No seizures were seen throughout the  recording.  Christin Moline Annabelle Harman

## 2023-02-11 NOTE — H&P (Signed)
History and Physical    Patient: Gabriela White ZOX:[id number] DOB: 10-31-1980 DOA: 02/11/2023 DOS: the patient was seen and examined on 02/12/2023 PCP: [facility], [facility]  Patient coming from: Home  Chief Complaint:  Chief Complaint  Patient presents with   Seizures   Altered Mental Status    HPI: Gabriela White is a 42 y.o. female with medical history significant for hypertension, seizures, alcohol abuse, history of GI bleed and symptomatic anemia coming for seizures today.  Patient was seen by neurologist in the emergency room and had an EEG.  Earlier today she had 2 seizures and then she was postictal all day.  Last drink was Monday night.  Patient does have withdrawal seizures.  There is concern that the patient may have missed her medications.  In the emergency room patient is cooperative with me but there are reports of patient being agitated and irritable.  Chart review shows patient has been at [facility] and initially wanted to be transferred there due to lack of capacity decision to transfer to [facility] was made which was later changed as patient was stable for [facility] per neurology.  Patient also had MRI few weeks ago which showed temporal lobe atrophy.  Neurology has continued patient on her home regimen of Vimpat, Depakote.  On Fe.  With recommendations for Ativan or Versed for any breakthrough seizures.  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  Recommended Resporal every 12 hours with monitoring of QTc and to stop if QTc is over 475, and taper and stop the Resporal once psychosis resolved. Patient is alert awake oriented fairly cooperative with me in the emergency room vitals are stable. Nurse at bedside collecting blood.  Blood work today shows hyponatremia of 129, normal LFTs, ammonia 41, CPK of 71.  Vitamin B12 883, CBC showing leukocytosis of 12.2 hemoglobin 12.3 platelet counts of 216.Valproic acid level at 108. TSH of  1.520.  Urinalysis today shows benzos and THC.  Head CT done today is negative for any acute intracranial abnormality. Patient received Versed and Ativan. Meds below given in ed.  Medications  sodium chloride 0.9 % bolus 1,000 mL (1,000 mLs Intravenous Patient Refused/Not Given 02/11/23 0359)  cloBAZam (ONFI) tablet 5 mg (5 mg Oral Given 02/11/23 2257)  lacosamide (VIMPAT) tablet 200 mg (200 mg Oral Given 02/11/23 2227)  divalproex (DEPAKOTE) DR tablet 1,000 mg (1,000 mg Oral Given 02/11/23 2228)  thiamine (VITAMIN B1) 500 mg in sodium chloride 0.9 % 50 mL IVPB (0 mg Intravenous Stopped 02/11/23 2309)    Followed by  thiamine (VITAMIN B1) 250 mg in sodium chloride 0.9 % 50 mL IVPB (has no administration in time range)    Followed by  thiamine (VITAMIN B1) injection 100 mg (has no administration in time range)  Oral care mouth rinse (has no administration in time range)  acetaminophen (TYLENOL) tablet 650 mg (650 mg Oral Given 02/11/23 2259)    Or  acetaminophen (TYLENOL) suppository 650 mg ( Rectal See Alternative 02/11/23 2259)  docusate sodium (COLACE) capsule 100 mg (100 mg Oral Given 02/11/23 2227)  polyethylene glycol (MIRALAX / GLYCOLAX) packet 17 g (has no administration in time range)  heparin injection 5,000 Units (5,000 Units Subcutaneous Given 02/11/23 2229)  lactated ringers infusion ( Intravenous Infusion Verify 02/12/23 0000)  pantoprazole (PROTONIX) injection 40 mg (40 mg Intravenous Given 02/11/23 2231)  Chlorhexidine Gluconate Cloth 2 % PADS 6 each (6 each Topical Given 02/11/23 2110)  LORazepam (ATIVAN) injection 1 mg (1 mg Intramuscular Given 02/11/23 0403)  midazolam PF (VERSED) injection 10 mg (10 mg Intramuscular Given 02/11/23 0857)    Review of Systems: Review of Systems  Neurological:  Positive for seizures.  All other systems reviewed and are negative.  Past Medical History:  Diagnosis Date   Acute blood loss anemia 09/16/2021   Hypertension    Seizures (HCC)    Past Surgical  History:  Procedure Laterality Date   COLONOSCOPY WITH PROPOFOL N/A 09/19/2021   Procedure: COLONOSCOPY WITH PROPOFOL;  Surgeon: Toney Reil, MD;  Location: [facility] ENDOSCOPY;  Service: Gastroenterology;  Laterality: N/A;   ESOPHAGOGASTRODUODENOSCOPY N/A 09/19/2021   Procedure: ESOPHAGOGASTRODUODENOSCOPY (EGD);  Surgeon: Toney Reil, MD;  Location: [facility] ENDOSCOPY;  Service: Gastroenterology;  Laterality: N/A;   TONSILLECTOMY     TUBAL LIGATION     Social History:  reports that she has been smoking cigarettes. She has a 10.00 pack-year smoking history. She has never used smokeless tobacco. She reports current alcohol use of about 6.0 - 9.0 standard drinks of alcohol per week. She reports that she does not use drugs.  Allergies  Allergen Reactions   Morphine Nausea And Vomiting    Family History  Problem Relation Age of Onset   Diabetes Maternal Grandmother    Heart disease Maternal Grandmother    Heart attack Maternal Grandmother    Heart disease Maternal Grandfather     Prior to Admission medications   Medication Sig Start Date End Date Taking? Authorizing Provider  acetaminophen (TYLENOL) 500 MG tablet Take 500-1,000 mg by mouth every 6 (six) hours as needed for mild pain or moderate pain.   Yes [provider]  cloBAZam (ONFI) 10 MG tablet Take 5 mg by mouth 2 (two) times daily. 02/03/23 05/04/23 Yes [provider]  divalproex (DEPAKOTE) 500 MG DR tablet Take 1,000 mg by mouth 2 (two) times daily. 01/16/23 04/16/23 Yes [provider]  ibuprofen (ADVIL) 200 MG tablet Take 400-600 mg by mouth every 6 (six) hours as needed for mild pain or moderate pain.   Yes [provider]  lacosamide (VIMPAT) 200 MG TABS tablet Take 200 mg by mouth 2 (two) times daily.   Yes [provider]  losartan (COZAAR) 25 MG tablet Take 12.5 mg by mouth daily. 02/02/23  Yes [provider]  DULoxetine (CYMBALTA) 30 MG capsule Take 30-60 mg by mouth 2  (two) times daily. Patient not taking: Reported on 09/18/2021    [provider]  gabapentin (NEURONTIN) 300 MG capsule Take 300 mg by mouth 2 (two) times daily. Patient not taking: Reported on 02/11/2023    [provider]  pantoprazole (PROTONIX) 40 MG tablet Take 1 tablet (40 mg total) by mouth daily. Patient not taking: Reported on 02/11/2023 06/26/21   Midge Minium, MD  tiZANidine (ZANAFLEX) 4 MG tablet Take 4 mg by mouth every 6 (six) hours as needed for muscle spasms. Patient not taking: Reported on 02/11/2023    [provider]   Vitals:   02/11/23 2300 02/11/23 2333 02/12/23 0000 02/12/23 0100  BP:  112/76    Pulse: 72 68 69 64  Resp: 17 19 15  (!) 36  Temp:  98.3 F (36.8 C)    TempSrc:  Oral    SpO2: 99% 100% 100% 100%  Weight:      Height:       Physical Exam Vitals and nursing note reviewed.  Constitutional:      General: She  is not in acute distress. HENT:     Head: Normocephalic and atraumatic.     Right Ear: Hearing and external ear normal.     Left Ear: Hearing and external ear normal.     Nose: Nose normal. No nasal deformity.     Mouth/Throat:     Lips: Pink.     Tongue: No lesions.     Pharynx: Oropharynx is clear.  Eyes:     General: Lids are normal.     Extraocular Movements: Extraocular movements intact.     Pupils: Pupils are equal, round, and reactive to light.  Cardiovascular:     Rate and Rhythm: Normal rate and regular rhythm.     Heart sounds: Normal heart sounds.  Pulmonary:     Effort: Pulmonary effort is normal.     Breath sounds: Normal breath sounds.  Abdominal:     General: Bowel sounds are normal. There is no distension.     Palpations: Abdomen is soft. There is no mass.     Tenderness: There is no abdominal tenderness.  Musculoskeletal:     Right lower leg: No edema.     Left lower leg: No edema.  Skin:    General: Skin is warm.  Neurological:     General: No focal deficit present.     Mental Status: She is  alert and oriented to person, place, and time.     Cranial Nerves: Cranial nerves 2-12 are intact.  Psychiatric:        Attention and Perception: Attention normal.        Mood and Affect: Mood normal.        Speech: Speech normal.        Behavior: Behavior normal. Behavior is cooperative.      Labs on Admission: I have personally reviewed following labs and imaging studies  CBC: Recent Labs  Lab 02/11/23 0350 02/11/23 2017  WBC 12.2* 7.8  HGB 12.3 12.2  HCT 35.1* 34.5*  MCV 86.9 86.7  PLT 216 198   Basic Metabolic Panel: Recent Labs  Lab 02/11/23 0350 02/11/23 1003 02/11/23 2017  NA 129*  --   --   K 4.7  --   --   CL 96*  --   --   CO2 21*  --   --   GLUCOSE 97  --   --   BUN 6  --   --   CREATININE 0.49  --  0.51  CALCIUM 9.7  --   --   MG  --  1.9  --    GFR: Estimated Creatinine Clearance: 77.3 mL/min (by C-G formula based on SCr of 0.51 mg/dL). Liver Function Tests: Recent Labs  Lab 02/11/23 0350  AST 29  ALT 17  ALKPHOS 56  BILITOT 1.1  PROT 7.7  ALBUMIN 4.7   No results for input(s): "LIPASE", "AMYLASE" in the last 168 hours. Recent Labs  Lab 02/11/23 1003  AMMONIA 41*   Coagulation Profile: No results for input(s): "INR", "PROTIME" in the last 168 hours. Cardiac Enzymes: Recent Labs  Lab 02/11/23 1003  CKTOTAL 71   BNP (last 3 results) No results for input(s): "PROBNP" in the last 8760 hours. HbA1C: No results for input(s): "HGBA1C" in the last 72 hours. CBG: No results for input(s): "GLUCAP" in the last 168 hours. Lipid Profile: No results for input(s): "CHOL", "HDL", "LDLCALC", "TRIG", "CHOLHDL", "LDLDIRECT" in the last 72 hours. Thyroid Function Tests: Recent Labs    02/11/23 1003  TSH 1.520   Anemia Panel: Recent Labs    02/11/23 1003  VITAMINB12 883   Urine analysis:    Component Value Date/Time   COLORURINE YELLOW (A) 02/11/2023 1655   APPEARANCEUR CLEAR (A) 02/11/2023 1655   LABSPEC 1.006 02/11/2023 1655    PHURINE 7.0 02/11/2023 1655   GLUCOSEU NEGATIVE 02/11/2023 1655   HGBUR NEGATIVE 02/11/2023 1655   BILIRUBINUR NEGATIVE 02/11/2023 1655   KETONESUR 5 (A) 02/11/2023 1655   PROTEINUR NEGATIVE 02/11/2023 1655   NITRITE NEGATIVE 02/11/2023 1655   LEUKOCYTESUR NEGATIVE 02/11/2023 1655    Radiological Exams on Admission: EEG adult  Result Date: 02/11/2023 Charlsie Quest, MD     02/11/2023  1:38 PM Patient Name: Gabriela White MRN: [id number] Epilepsy Attending: Charlsie Quest Referring Physician/Provider: Gordy Councilman, MD Date: 02/11/2023 Duration: 46.05 mins Patient history:  42 y.o. 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 EEG to evaluate for seizure Level of alertness: Awake, asleep AEDs during EEG study: VPA, LCM, Onfi Technical aspects: This EEG study was done with scalp electrodes positioned according to the 10-20 International system of electrode placement. Electrical activity was reviewed with band pass filter of 1-70Hz , sensitivity of 7 uV/mm, display speed of 56mm/sec with a 60Hz  notched filter applied as appropriate. EEG data were recorded continuously and digitally stored.  Video monitoring was available and reviewed as appropriate. Description: The posterior dominant rhythm consists of 9-10 Hz activity of moderate voltage (25-35 uV) seen predominantly in posterior head regions, symmetric and reactive to eye opening and eye closing. Sleep was characterized by vertex waves, sleep spindles (12 to 14 Hz), maximal frontocentral region. EEG showed intermittent sharply contoured 3 to 6 Hz theta-delta slowing in left and right temporal region. Spikes were noted in right temporal region. Hyperventilation  and photic stimulation were not performed.   ABNORMALITY - Spike, right temporal region - Intermittent slow,  left and right temporal region. IMPRESSION: This study is consistent with patient's history of epilepsy arising from right temporal region. Additionally there is cortical dysfunction arising from left and right temporal region. No seizures were seen throughout the recording. Priyanka Annabelle Harman   CT HEAD WO CONTRAST ( )  Result Date: 02/11/2023 CLINICAL DATA:  Seizure disorder, clinical change EXAM: CT HEAD WITHOUT CONTRAST TECHNIQUE: Contiguous axial images were obtained from the base of the skull through the vertex without intravenous contrast. RADIATION DOSE REDUCTION: This exam was performed according to the departmental dose-optimization program which includes automated exposure control, adjustment of the mA and/or kV according to patient size and/or use of iterative reconstruction technique. COMPARISON:  CT head 09/16/21 FINDINGS: Brain: No evidence of acute infarction, hemorrhage, hydrocephalus, extra-axial collection or mass lesion/mass effect. Vascular: No hyperdense vessel or unexpected calcification. Skull: Normal. Negative for fracture or focal lesion. Sinuses/Orbits: No middle ear or mastoid effusion. Paranasal sinuses are clear. Orbits are unremarkable Other: None. IMPRESSION: No acute intracranial abnormality. Electronically Signed   By: Lorenza Cambridge M.D.   On: 02/11/2023 11:01   DG Chest Port 1 View  Result Date: 02/11/2023 CLINICAL DATA:  Altered mental status, history of seizures EXAM: PORTABLE CHEST 1 VIEW COMPARISON:  None Available. FINDINGS: The cardiomediastinal silhouette is normal There is no focal consolidation or pulmonary edema. There is no pleural effusion  or pneumothorax There is no acute osseous abnormality. IMPRESSION: No radiographic evidence of acute cardiopulmonary process. Electronically Signed   By: Lesia Hausen M.D.   On: 02/11/2023 10:00     Data  Reviewed: Relevant notes from primary care and specialist visits, past discharge summaries as available in EHR, including Care Everywhere. Prior diagnostic testing as pertinent to current admission diagnoses Updated medications and problem lists for reconciliation ED course, including vitals, labs, imaging, treatment and response to treatment Triage notes, nursing and pharmacy notes and ED provider's notes Notable results as noted in HPI Assessment and Plan: * Seizure (HCC) Admit to stepdown unit. Continuous cardiac monitoring. Continue patient on Onfi Depakote and Vimpat per neurology recommendation. As needed Versed or Ativan for breakthrough seizures. Neurology consulted. Seizure precaution. Aspiration precaution.  EEG done earlier.  Avoid buspar/ cephalosporins / meds that may precipitate seizures.   GI bleeding H/O GIB, no reports of any bleeding or melena . Anemia is resolved.  IV PPI continued.   Alcohol abuse Pt currently does report drinking.  Thiamine 100 mg . CIWA.  Aspiration/ fall precaution.     Tobacco use disorder Nicotine patch. Education and counseling once pt is stable   Gastroesophageal reflux disease IV ppi.    Essential hypertension Stable.  Currently will hold hydrochlorothiazide.       DVT prophylaxis:  Heparin.  Consults:  Neurology: Dr Iver Nestle.  Advance Care Planning:    Code Status: Full Code  Family Communication:  None. Disposition Plan:  Back to previous home environment  Severity of Illness: The appropriate patient status for this patient is INPATIENT. Inpatient status is judged to be reasonable and necessary in order to provide the required intensity of service to ensure the patient's safety. The patient's presenting symptoms, physical exam findings, and initial radiographic and laboratory data in the context of their chronic comorbidities is felt to place them at high risk for further clinical deterioration. Furthermore, it  is not anticipated that the patient will be medically stable for discharge from the hospital within 2 midnights of admission.   * I certify that at the point of admission it is my clinical judgment that the patient will require inpatient hospital care spanning beyond 2 midnights from the point of admission due to high intensity of service, high risk for further deterioration and high frequency of surveillance required.*  Author: Gertha Calkin, MD 02/12/2023 1:41 AM  For on call review [email or web address].

## 2023-02-11 NOTE — ED Provider Notes (Signed)
-----------------------------------------   10:17 AM on 02/11/2023 -----------------------------------------  I took over care of this patient from Dr. Lenard Lance.  Dr. Iver Nestle from neurology evaluated the patient.  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  She has ordered IM Versed, the patient's morning seizure medications, and a spot EEG.  She recommends that the patient be transferred to a tertiary center where she can have continuous EEG monitoring.    Since the patient gets most of her care at [facility] I contacted the [facility] transfer center and I am waiting to hear from a provider.  The patient's husband is in agreement with the plan.  Since we are proceeding with neurowork-up, I have advised the psychiatry NP that a psychiatry consult is not needed at this time.    ----------------------------------------- 1:02 PM on 02/11/2023 -----------------------------------------  [facility] advised that they are at capacity and not currently accepting transfers.  Dr. Iver Nestle recommends transfer to [facility].  I contacted the transfer center at [facility] at approximately 1120 and am awaiting callback from a provider.  ----------------------------------------- 1:50 PM on 02/11/2023 -----------------------------------------  I consulted and discussed case with Dr. Janee Morn from the hospitalist service at [facility] who has accepted the patient for transfer.  The patient is stable for transfer at this time.    Dionne Bucy, MD 02/11/23 1350

## 2023-02-11 NOTE — ED Notes (Signed)
Pt started yelling after the MD started an USIV to the RUE. Pt insisted that this RN take the IV out immediately while yelling at this RN. MD is at bedside speaking to pt and pt is yelling that the IV is hurting and the MD is talking to the pt about how the IV is no longer in place. Pt is agreeing to IM Ativan at this time.

## 2023-02-11 NOTE — ED Provider Notes (Signed)
[facility] Provider Note    Event Date/Time   First MD Initiated Contact with Patient 02/11/23 [phone]     (approximate)  History   Chief Complaint: Seizures and Altered Mental Status  HPI  Gabriela White is a 42 y.o. female with a past medical history of seizure disorder, hypertension, alcoholism, presents to the emergency department for possible seizures.  Upon arrival patient appears extremely anxious, she is shaking her arms and feet but is awake alert and answering questions mostly appropriately.  Patient does have a stutter to her speech, however abruptly she is able to stop shaking calm down and answer questions clearly without stutter.  Patient denies alcohol use.  States her husband doses her medication she does not believe she has missed any of her seizure medications.  Physical Exam   Triage Vital Signs: ED Triage Vitals  Enc Vitals Group     BP 02/11/23 0324 (!) 141/105     Pulse Rate 02/11/23 0324 (!) 106     Resp 02/11/23 0324 18     Temp 02/11/23 0324 98.4 F (36.9 C)     Temp Source 02/11/23 0324 Oral     SpO2 02/11/23 0324 99 %     Weight 02/11/23 0324 125 lb 10.6 oz (57 kg)     Height 02/11/23 0324 5\' 3"  (1.6 m)     Head Circumference --      Peak Flow --      Pain Score 02/11/23 0323 0     Pain Loc --      Pain Edu? --      Excl. in GC? --     Most recent vital signs: Vitals:   02/11/23 0324  BP: (!) 141/105  Pulse: (!) 106  Resp: 18  Temp: 98.4 F (36.9 C)  SpO2: 99%    General: Awake, no distress.  Patient is very anxious appearing initially shaking all over with stuttering speech yelling at [place] at times appears agitated. CV:  Good peripheral perfusion.  Regular rate and rhythm  Resp:  Normal effort.  Equal breath sounds bilaterally.  Abd:  No distention.  Soft, nontender.  No rebound or guarding. Other:  Patient has bruising to bilateral extremities consistent with prior IV attempts/hematomas.  All of which appear  older.  ED Results / Procedures / Treatments   MEDICATIONS ORDERED IN ED: Medications  sodium chloride 0.9 % bolus 1,000 mL (has no administration in time range)  LORazepam (ATIVAN) injection 1 mg (has no administration in time range)     IMPRESSION / MDM / ASSESSMENT AND PLAN / ED COURSE  I reviewed the triage vital signs and the nursing notes.  Patient's presentation is most consistent with acute presentation with potential threat to life or bodily function.  Patient presents to the emergency department for seizure-like activity.  Patient arrives with tremulous speech yelling at times.  Patient was shaking in her arms and legs bilaterally but answering questions.  Initially had a stuttering speech however she abruptly stopped shaking and answers questions clearly without any speech difficulty.    Patient was a difficult stick took multiple attempts to try to obtain an IV without success.  I ultrasound-guided an IV.  We were able to flush the IV and draw blood without issue.  Confirmed that the IV was appropriately placed.  Patient calm throughout.  However shortly after IV placement patient began yelling and screaming telling us to take the IV out of the arm that  she does not like it and it hurts.  There was no medication or fluids going into the IV and again confirmed with ultrasound appropriately placed.  Patient becomes agitated and starts yelling and then begins with shaking and stuttering speech all over again.  Unclear if this is true partial seizure activity, patient remains lucid throughout answering questions and continues to yell at the nurse and appears agitated.  Removed the IV per patient request prior to any medications or fluids.  Patient's lab work is largely nonrevealing chemistry shows mild hyponatremia however largely unchanged from historical values.  Patient has removed her IV and is refusing any further IV sticks.  Patient CBC reassuring with slight slight leukocytosis.   Ethanol levels negative.  Patient's husband is now here with the patient who states patient has been acting normal until Sunday when she had a grand mal seizure.  He states since that time patient has been confused altered, states she is taking her medications but the husband caught her flushing the medications down the toilet for unknown reasons.  He states this is very abnormal for the patient.  However patient is pretty adamant about not being admitted and the husband states often times she will leave AGAINST MEDICAL ADVICE.  Given the patient's intermittent agitation and altered state at home we will have psychiatry evaluate.  Will have neurology evaluate in the emergency department.  They believe the patient needs to be admitted we will admit the patient at that time.  Husband is agreeable to this plan as well.  FINAL CLINICAL IMPRESSION(S) / ED DIAGNOSES   Altered mental status Agitation Seizure activity   Note:  This document was prepared using Dragon voice recognition software and may include unintentional dictation errors.   Minna Antis, MD 02/11/23 [phone]

## 2023-02-11 NOTE — BH Assessment (Signed)
This writer attempted to assess patient but patient is currently sleeping, will pass along to day shift Psyc team to assess patient when she is alert

## 2023-02-11 NOTE — Consult Note (Signed)
  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 Neurology is currently following and admitting this patient. Suspect AMS is related to breakthrough seizures.   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 Postictal psychosis can develop several days to 1 week following cluster of seizures.  Symptoms do vary but include multiple degrees of confusion, delirium, decreased attention span, alterations in sleep wake cycle, increased autonomic activity and often coexist with psychotic features.  Psychosis can present as hallucinations sometimes auditory, visual, somatosensory, paranoia, delusional, mistaken identifications.  Psychosis tends to last about 15 hours to 2 months.   TTS and Psychiatric consult placed for this patient. She is being admitted under neurology service.    -seizure work-up and management as per neurology recommendations. It is my thinking that the pt's psychosis is 2/2 seizures (inter-ictal psychosis, post-ictal psychosis), and if seizures were controlled, then psychosis would resolve.  -continue home medications -Consider starting Risperdal 0.25 mg q12H for psychosis. When psychosis resolves (in hospital or at home), taper/stop the antipsychotic. If antipsychotic is started please obtain EKG, do not administer if QTc is > 475.    -Will dc consult at this time.

## 2023-02-11 NOTE — Progress Notes (Signed)
Eeg done 

## 2023-02-12 ENCOUNTER — Encounter: Payer: Self-pay | Admitting: Internal Medicine

## 2023-02-12 ENCOUNTER — Ambulatory Visit: Payer: PRIVATE HEALTH INSURANCE

## 2023-02-12 DIAGNOSIS — R569 Unspecified convulsions: Secondary | ICD-10-CM

## 2023-02-12 LAB — COMPREHENSIVE METABOLIC PANEL
ALT: 14 U/L (ref 0–44)
AST: 21 U/L (ref 15–41)
Albumin: 3.5 g/dL (ref 3.5–5.0)
Alkaline Phosphatase: 47 U/L (ref 38–126)
Anion gap: 7 (ref 5–15)
BUN: 7 mg/dL (ref 6–20)
CO2: 23 mmol/L (ref 22–32)
Calcium: 8.8 mg/dL — ABNORMAL LOW (ref 8.9–10.3)
Chloride: 96 mmol/L — ABNORMAL LOW (ref 98–111)
Creatinine, Ser: 0.45 mg/dL (ref 0.44–1.00)
GFR, Estimated: >60 mL/min (ref >60)
Glucose, Bld: 79 mg/dL (ref 70–99)
Potassium: 3.8 mmol/L (ref 3.5–5.1)
Sodium: 126 mmol/L — ABNORMAL LOW (ref 135–145)
Total Bilirubin: 0.7 mg/dL (ref 0.3–1.2)
Total Protein: 5.7 g/dL — ABNORMAL LOW (ref 6.5–8.1)

## 2023-02-12 LAB — CBC
HCT: 29.1 % — ABNORMAL LOW (ref 36.0–46.0)
Hemoglobin: 10.3 g/dL — ABNORMAL LOW (ref 12.0–15.0)
MCH: 30.5 pg (ref 26.0–34.0)
MCHC: 35.4 g/dL (ref 30.0–36.0)
MCV: 86.1 fL (ref 80.0–100.0)
Platelets: 162 10*3/uL (ref 150–400)
RBC: 3.38 MIL/uL — ABNORMAL LOW (ref 3.87–5.11)
RDW: 14.5 % (ref 11.5–15.5)
WBC: 6.8 10*3/uL (ref 4.0–10.5)
nRBC: 0 % (ref 0.0–0.2)

## 2023-02-12 LAB — RPR: RPR Ser Ql: NONREACTIVE

## 2023-02-12 LAB — VALPROIC ACID LEVEL: Valproic Acid Lvl: 116 ug/mL — ABNORMAL HIGH (ref 50.0–100.0)

## 2023-02-12 LAB — AMMONIA: Ammonia: 20 umol/L (ref 9–35)

## 2023-02-12 LAB — MAGNESIUM: Magnesium: 1.9 mg/dL (ref 1.7–2.4)

## 2023-02-12 MED ORDER — CLOBAZAM 2.5 MG/ML PO SUSP
5.0000 mg | Freq: Two times a day (BID) | ORAL | Status: DC
  Administered 2023-02-12: 5 mg via ORAL
  Filled 2023-02-12: qty 4

## 2023-02-12 NOTE — Assessment & Plan Note (Signed)
IV ppi.   

## 2023-02-12 NOTE — Discharge Summary (Signed)
Physician Discharge Summary   Patient: Gabriela White MRN: [id number] DOB: 01-07-1981  Admit date:     02/11/2023  Discharge date: 02/12/23  Discharge Physician: Anny Sayler   PCP: [facility]   Recommendations at discharge:   Abstain from alcohol use Keep scheduled follow-up appointment with neurologist  Discharge Diagnoses: Principal Problem:   Seizure [facility]) Active Problems:   Essential hypertension   Gastroesophageal reflux disease   Tobacco use disorder   Alcohol abuse  Resolved Problems:   * No resolved hospital problems. *  Hospital Course:  Gabriela White is a 42 y.o. female with medical history significant for hypertension, seizures, alcohol abuse, history of GI bleed and symptomatic anemia coming for seizures today.  Patient was seen by neurologist in the emergency room and had an EEG.  Earlier today she had 2 seizures and then she was postictal all day.  Last drink was Monday night.  Patient does have withdrawal seizures.  There is concern that the patient may have missed her medications.  In the emergency room patient is cooperative with me but there are reports of patient being agitated and irritable.  Chart review shows patient has been at [facility] and initially wanted to be transferred there due to lack of capacity decision to transfer to [facility] was made which was later changed as patient was stable for [facility] per neurology.  Patient also had MRI few weeks ago which showed temporal lobe atrophy.  Neurology has continued patient on her home regimen of Vimpat, Depakote.  O With recommendations for Ativan or Versed for any breakthrough seizures.  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  Recommended Resporal every 12 hours with monitoring of QTc and to stop if QTc is over 475, and taper and stop the Resporal once psychosis resolved. Patient is alert awake oriented fairly cooperative with me in the  emergency room vitals are stable. Nurse at bedside collecting blood.  Blood work today shows hyponatremia of 129, normal LFTs, ammonia 41, CPK of 71.  Vitamin B12 883, CBC showing leukocytosis of 12.2 hemoglobin 12.3 platelet counts of 216.Valproic acid level at 108. TSH of 1.520.  Urinalysis today shows benzos and THC.  Head CT done today is negative for any acute intracranial abnormality. Patient received Versed and Ativan. Meds below given in ed.     Assessment and Plan: Seizure Disorder Known history of temporal lobe epilepsy Had a seizure several days prior to admission but was brought into the ER by husband for evaluation of altered mental status. Patient is back to her baseline mental status Appreciate neurology input Patient advised to take antiepileptic medications as recommended under close supervision by her husband Follow-up with her neurologist at [facility] Full abstinence from alcohol Should not drive     Alcohol abuse Pt currently does report drinking.  Advised to abstain from further alcohol use       Tobacco use disorder Nicotine patch. Education and counseling once pt is stable          Consultants: Neurology Procedures performed: None Disposition: Home Diet recommendation:  Discharge Diet Orders (From admission, onward)     Start     Ordered   02/12/23 0000  Diet - low sodium heart healthy        02/12/23 1339           Regular diet DISCHARGE MEDICATION: Allergies as of 02/12/2023       Reactions  Morphine Nausea And Vomiting        Medication List     STOP taking these medications    DULoxetine 30 MG capsule Commonly known as: CYMBALTA   gabapentin 300 MG capsule Commonly known as: NEURONTIN   ibuprofen 200 MG tablet Commonly known as: ADVIL   pantoprazole 40 MG tablet Commonly known as: PROTONIX   tiZANidine 4 MG tablet Commonly known as: ZANAFLEX       TAKE these medications    acetaminophen 500 MG tablet Commonly known  as: TYLENOL Take 500-1,000 mg by mouth every 6 (six) hours as needed for mild pain or moderate pain.   cloBAZam 10 MG tablet Commonly known as: ONFI Take 5 mg by mouth 2 (two) times daily.   divalproex 500 MG DR tablet Commonly known as: DEPAKOTE Take 1,000 mg by mouth 2 (two) times daily.   lacosamide 200 MG Tabs tablet Commonly known as: VIMPAT Take 200 mg by mouth 2 (two) times daily.   losartan 25 MG tablet Commonly known as: COZAAR Take 12.5 mg by mouth daily.        Discharge Exam: Filed Weights   02/13/23 0324 February 13, 2023 2044  Weight: 57 kg 61.7 kg   Vitals and nursing note reviewed.  Constitutional:      General: She is not in acute distress. HENT:     Head: Normocephalic and atraumatic.     Right Ear: Hearing and external ear normal.     Left Ear: Hearing and external ear normal.     Nose: Nose normal. No nasal deformity.     Mouth/Throat:     Lips: Pink.     Tongue: No lesions.     Pharynx: Oropharynx is clear.  Eyes:     General: Lids are normal.     Extraocular Movements: Extraocular movements intact.     Pupils: Pupils are equal, round, and reactive to light.  Cardiovascular:     Rate and Rhythm: Normal rate and regular rhythm.     Heart sounds: Normal heart sounds.  Pulmonary:     Effort: Pulmonary effort is normal.     Breath sounds: Normal breath sounds.  Abdominal:     General: Bowel sounds are normal. There is no distension.     Palpations: Abdomen is soft. There is no mass.     Tenderness: There is no abdominal tenderness.  Musculoskeletal:     Right lower leg: No edema.     Left lower leg: No edema.  Skin:    General: Skin is warm.  Neurological:     General: No focal deficit present.     Mental Status: She is alert and oriented to person, place, and time.     Cranial Nerves: Cranial nerves 2-12 are intact.  Psychiatric:        Attention and Perception: Attention normal.        Mood and Affect: Mood normal.        Speech: Speech  normal.        Behavior: Behavior normal. Behavior is cooperative.       Condition at discharge: stable  The results of significant diagnostics from this hospitalization (including imaging, microbiology, ancillary and laboratory) are listed below for reference.   Imaging Studies: EEG adult  Result Date: 02-13-2023 Gabriela Quest, MD     2023-02-13  1:38 PM Patient Name: Gabriela White MRN: [id number] Epilepsy Attending: Charlsie White Referring Physician/Provider: Gordy Councilman, MD Date: 02-13-2023 Duration: 46.05 mins  Patient history:  42 y.o. 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 EEG to evaluate for seizure Level of alertness: Awake, asleep AEDs during EEG study: VPA, LCM, Onfi Technical aspects: This EEG study was done with scalp electrodes positioned according to the 10-20 International system of electrode placement. Electrical activity was reviewed with band pass filter of 1-70Hz , sensitivity of 7 uV/mm, display speed of 40mm/sec with a 60Hz  notched filter applied as appropriate. EEG data were recorded continuously and digitally stored.  Video monitoring was available and reviewed as appropriate. Description: The posterior dominant rhythm consists of 9-10 Hz activity of moderate voltage (25-35 uV) seen predominantly in posterior head regions, symmetric and reactive to eye opening and eye closing. Sleep was characterized by vertex waves, sleep spindles (12 to 14 Hz), maximal frontocentral region. EEG showed intermittent sharply contoured 3 to 6 Hz theta-delta slowing in left and right temporal region. Spikes were noted in right temporal region. Hyperventilation and photic stimulation were not  performed.   ABNORMALITY - Spike, right temporal region - Intermittent slow,  left and right temporal region. IMPRESSION: This study is consistent with patient's history of epilepsy arising from right temporal region. Additionally there is cortical dysfunction arising from left and right temporal region. No seizures were seen throughout the recording. Priyanka Annabelle Harman   CT HEAD WO CONTRAST ( )  Result Date: 02/11/2023 CLINICAL DATA:  Seizure disorder, clinical change EXAM: CT HEAD WITHOUT CONTRAST TECHNIQUE: Contiguous axial images were obtained from the base of the skull through the vertex without intravenous contrast. RADIATION DOSE REDUCTION: This exam was performed according to the departmental dose-optimization program which includes automated exposure control, adjustment of the mA and/or kV according to patient size and/or use of iterative reconstruction technique. COMPARISON:  CT head 09/16/21 FINDINGS: Brain: No evidence of acute infarction, hemorrhage, hydrocephalus, extra-axial collection or mass lesion/mass effect. Vascular: No hyperdense vessel or unexpected calcification. Skull: Normal. Negative for fracture or focal lesion. Sinuses/Orbits: No middle ear or mastoid effusion. Paranasal sinuses are clear. Orbits are unremarkable Other: None. IMPRESSION: No acute intracranial abnormality. Electronically Signed   By: Lorenza Cambridge M.D.   On: 02/11/2023 11:01   DG Chest Port 1 View  Result Date: 02/11/2023 CLINICAL DATA:  Altered mental status, history of seizures EXAM: PORTABLE CHEST 1 VIEW COMPARISON:  None Available. FINDINGS: The cardiomediastinal silhouette is normal There is no focal consolidation or pulmonary edema. There is no pleural effusion or pneumothorax There is no acute osseous abnormality. IMPRESSION: No radiographic evidence of acute cardiopulmonary process. Electronically Signed   By: Lesia Hausen M.D.   On: 02/11/2023 10:00    Microbiology: Results for orders placed or performed  during the hospital encounter of 02/11/23  MRSA Next Gen by PCR, Nasal     Status: None   Collection Time: 02/11/23  8:44 PM   Specimen: Nasal Mucosa; Nasal Swab  Result Value Ref Range Status   MRSA by PCR Next Gen NOT DETECTED NOT DETECTED Final    Comment: (NOTE) The GeneXpert MRSA Assay (FDA approved for NASAL specimens only), is one component of a comprehensive MRSA colonization surveillance program. It is not intended to diagnose MRSA infection nor to guide or monitor treatment for MRSA infections. Test performance is not FDA approved in patients less  than 49 years old. Performed at [facility], [address] [place], [place] [address]     Labs: CBC: Recent Labs  Lab 02/11/23 0350 02/11/23 2017 02/12/23 0451  WBC 12.2* 7.8 6.8  HGB 12.3 12.2 10.3*  HCT 35.1* 34.5* 29.1*  MCV 86.9 86.7 86.1  PLT 216 198 162   Basic Metabolic Panel: Recent Labs  Lab 02/11/23 0350 02/11/23 1003 02/11/23 2017 02/12/23 0451 02/12/23 0902  NA 129*  --   --  126*  --   K 4.7  --   --  3.8  --   CL 96*  --   --  96*  --   CO2 21*  --   --  23  --   GLUCOSE 97  --   --  79  --   BUN 6  --   --  7  --   CREATININE 0.49  --  0.51 0.45  --   CALCIUM 9.7  --   --  8.8*  --   MG  --  1.9  --   --  1.9   Liver Function Tests: Recent Labs  Lab 02/11/23 0350 02/12/23 0451  AST 29 21  ALT 17 14  ALKPHOS 56 47  BILITOT 1.1 0.7  PROT 7.7 5.7*  ALBUMIN 4.7 3.5   CBG: No results for input(s): "GLUCAP" in the last 168 hours.  Discharge time spent: greater than 30 minutes.  Signed: Lucile Shutters, MD [facility] 02/12/2023

## 2023-02-12 NOTE — Assessment & Plan Note (Signed)
Nicotine patch. Education and counseling once pt is stable

## 2023-02-12 NOTE — Assessment & Plan Note (Addendum)
Admit to stepdown unit. Continuous cardiac monitoring. Continue patient on Onfi Depakote and Vimpat per neurology recommendation. As needed Versed or Ativan for breakthrough seizures. Neurology consulted. Seizure precaution. Aspiration precaution.  EEG done earlier.  Avoid buspar/ cephalosporins / meds that may precipitate seizures.

## 2023-02-12 NOTE — Assessment & Plan Note (Signed)
Pt currently does report drinking.  Thiamine 100 mg . CIWA.  Aspiration/ fall precaution.

## 2023-02-12 NOTE — Progress Notes (Signed)
Neurology Progress Note   Subjective: - No complaints, able to spontaneously tell me husband needs to directly observe her taking her meds and endorses she has been drinking - Typical seizure frequency 1 x / month; May 2023 first seizure confirmed with husband - Patient described some difficulty getting one of her seizure meds but couldn't remember details - Husband able to report Onfi has been difficult to get refilled reliably at [facility] but they just got 90 day supply 1 week ago, no supply issues at this time  Exam: Vitals:   02/12/23 0500 02/12/23 0600  BP:    Pulse: 68 (!) 54  Resp: 19 13  Temp:    SpO2: 96% 99%   Gen: In bed, comfortable  Resp: non-labored breathing, no grossly audible wheezing Cardiac: Perfusing extremities well  Abd: soft, nt  Neuro: MS: Awake, alert, fully oriented except occassional word finding / short term memory issues, fluent casual speech, initially states year is 2004 but able to self-correct  CN: PERR, EMOI with mild saccades, mild L nasolabial fold flattening, tongue midline Motor: No pronator drift. Finger to nose and heel to shin intact bilaterally  DTR: RLE 4+, LLE 3+ at the patellar. 2+ and symmetric brachioradialis   Pertinent Labs:   Basic Metabolic Panel: Recent Labs  Lab 02/11/23 0350 02/11/23 1003 02/11/23 2017 02/12/23 0451  NA 129*  --   --  126*  K 4.7  --   --  3.8  CL 96*  --   --  96*  CO2 21*  --   --  23  GLUCOSE 97  --   --  79  BUN 6  --   --  7  CREATININE 0.49  --  0.51 0.45  CALCIUM 9.7  --   --  8.8*  MG  --  1.9  --   --    Last metabolic panel Lab Results  Component Value Date   CALCIUM 8.8 (L) 02/12/2023   PROT 5.7 (L) 02/12/2023   ALBUMIN 3.5 02/12/2023   LABGLOB 2.5 01/02/2021   AGRATIO 2.0 01/02/2021   BILITOT 0.7 02/12/2023   ALKPHOS 47 02/12/2023   AST 21 02/12/2023   ALT 14 02/12/2023   ANIONGAP 7 02/12/2023   CBC: Recent Labs  Lab 02/11/23 0350 02/11/23 2017 02/12/23 0451  WBC  12.2* 7.8 6.8  HGB 12.3 12.2 10.3*  HCT 35.1* 34.5* 29.1*  MCV 86.9 86.7 86.1  PLT 216 198 162    Coagulation Studies: No results for input(s): "LABPROT", "INR" in the last 72 hours.    Lab Results  Component Value Date   VALPROATE 108 (H) 02/11/2023   Ammonia 41  Impression: Markedly improved mental status, remains at reported baseline with mild memory impairments, slight left facial droop, mild impulsivity noted on exam. Ammonia and depakote elevation potentially in the setting of concomitant ethanol use; will repeat to confirm no risk of increasing toxicity.   Recommendations: - Repeat Ammonia and Depakote levels to confirm no dose adjustments; as total protein now low will also check free level to be followed up by outpatient neurologist - If ammonia remains elevated will add L-carnitine supplement - Please advise husband all ethanol in the home should be kept completely inaccessible to patient - Recommend directly observed medication administration for patient at home going forward, if possible (husband reports they have a good system in place and he will make sure no missed medications going forward) - Appreciate hyponatermia workup and management per primary team  noting worsening hyponatremia will lower seizure threshold  - Magnesium added on, supplement if needed - Include seizure precautions below in discharge instructions and review with patient/husband - Pending total depakote and ammonia levels will make final medication recommendations in addendum below; neurology will sign off and be available as needed, please reach out if additional questions/concerns arise  Addendum: Repeat ammonia level is reassuring at 20.  However valproic acid level is even higher today. Lab Results  Component Value Date   VALPROATE 116 (H) 02/12/2023   Recommendations: - Close outpatient follow-up with PCP for valproic acid free and total levels, CMP, ammonia in 4 to 7 days - Continue  planned outpatient follow-up with epilepsy specialist (03/04/2023 appointment with Dr. Marland Mcalpine [facility]), may need EMU stay for med adjustment or VNS consideration - Continue full abstinence from alcohol - Continue home doses of Depakote 1000 mg twice daily, Onfi 5 mg twice daily, Vimpat 200 mg twice daily  Standard seizure precautions: Per [facility] statutes, patients with seizures are not allowed to drive until  they have been seizure-free for six months. Use caution when using heavy equipment or power tools. Avoid working on ladders or at heights. Take showers instead of baths. Ensure the water temperature is not too high on the home water heater. Do not go swimming alone. When caring for infants or small children, sit down when holding, feeding, or changing them to minimize risk of injury to the child in the event you have a seizure.  To reduce risk of seizures, maintain good sleep hygiene avoid alcohol and illicit drug use, take all anti-seizure medications as prescribed.   Brooke Dare MD-PhD [facility] [phone]   > 50 min spent in care of patient, majority at bedside Discussed with primary team via secure chat

## 2023-02-12 NOTE — Assessment & Plan Note (Signed)
Stable.  Currently will hold hydrochlorothiazide.

## 2023-02-12 NOTE — Assessment & Plan Note (Signed)
H/O GIB, no reports of any bleeding or melena . Anemia is resolved.  IV PPI continued.

## 2023-02-12 NOTE — Progress Notes (Signed)
Transition of Care [facility]) - Inpatient Brief Assessment   Patient Details  Name: Gabriela White MRN: [id number] Date of Birth: [birth year]  Transition of Care [facility]) CM/SW Contact:    Kreg Shropshire, RN Phone Number: 02/12/2023, 1:49 PM   Clinical Narrative: D/c orders for pt. No toc needs at this time. TOC signing off.   Transition of Care Asessment: Insurance and Status: Insurance coverage has been reviewed Patient has primary care physician: Yes Home environment has been reviewed: home with spouse   Prior/Current Home Services: No current home services Social Determinants of Health Reivew: SDOH reviewed no interventions necessary Readmission risk has been reviewed: Yes Transition of care needs: no transition of care needs at this time

## 2023-02-13 LAB — GLUCOSE, CAPILLARY: Glucose-Capillary: 90 mg/dL (ref 70–99)

## 2023-02-16 LAB — VITAMIN B1: Vitamin B1 (Thiamine): 111.9 nmol/L (ref 66.5–200.0)

## 2023-02-16 LAB — FREE VALPROIC ACID (DEPAKOTE): Valproic Acid, Free: 51.6 ug/mL — ABNORMAL HIGH (ref 6.0–22.0)

## 2023-08-10 IMAGING — CT CT HEAD W/O CM
4 series · 16 of 47 positions shown, 18 images · non-contrast
Comparison: None.

CLINICAL DATA: Head trauma, poor oral intake 4 days



[Series 2: head wo · axial · 0.42mm/px · z∈[-586,-476]mm · 7 of 30 slices shown, 9 images]
[im 4/30  brain]
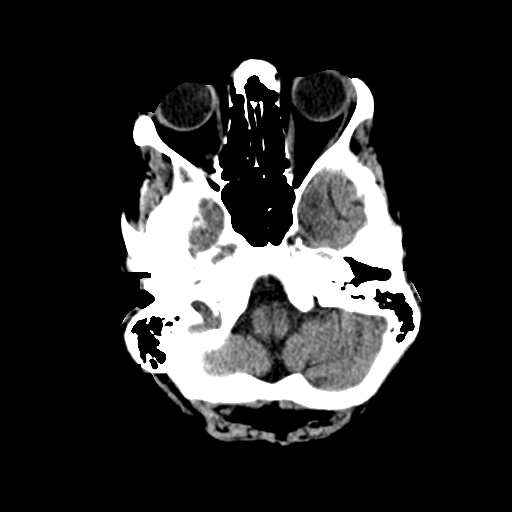
[im 4/30  bone]
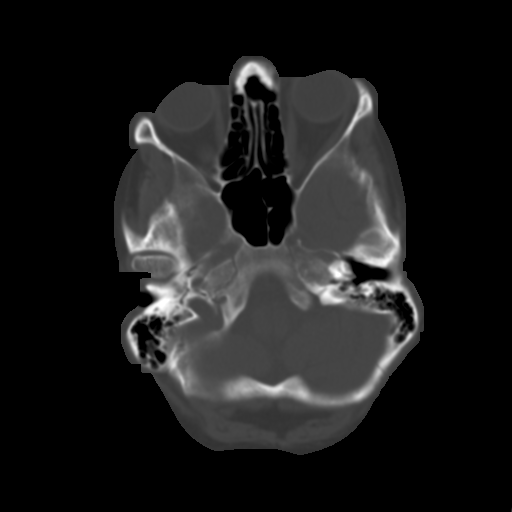
[im 8/30  brain]
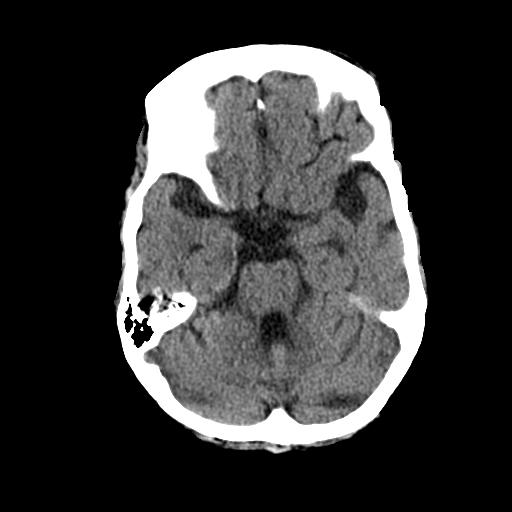
[im 11/30  brain]
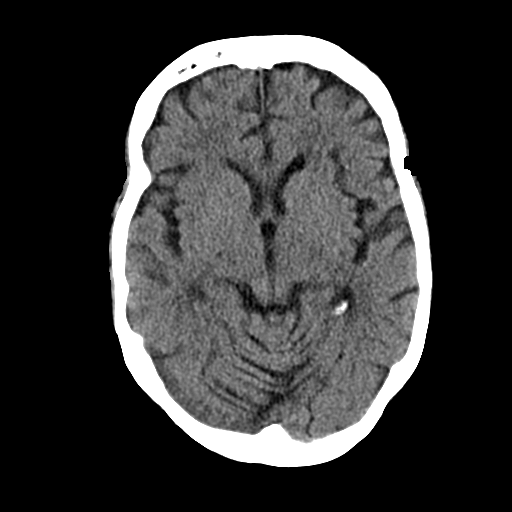
[im 15/30  brain]
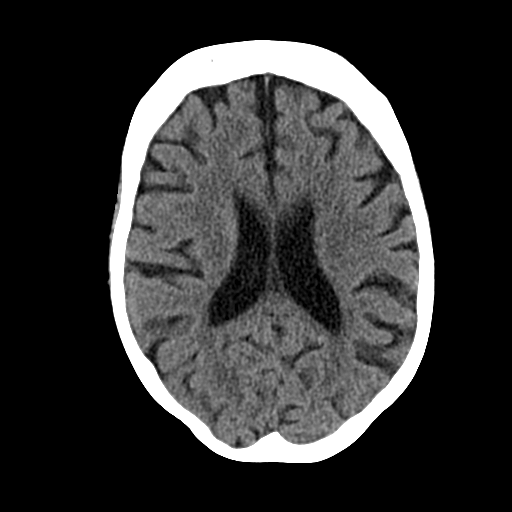
[im 19/30  brain]
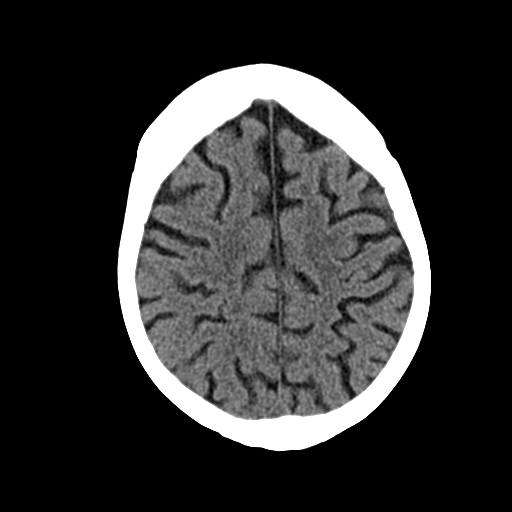
[im 19/30  bone]
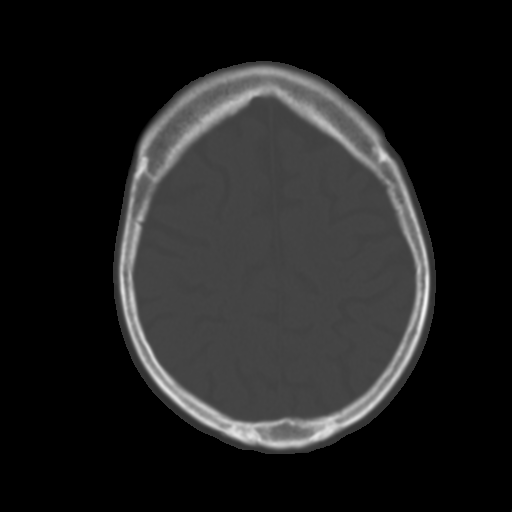
[im 22/30  brain]
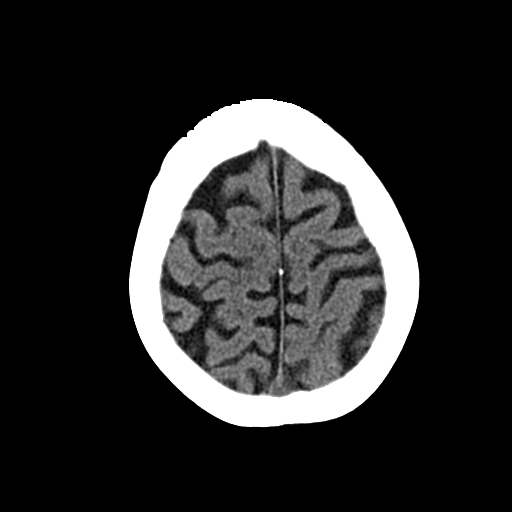
[im 26/30  brain]
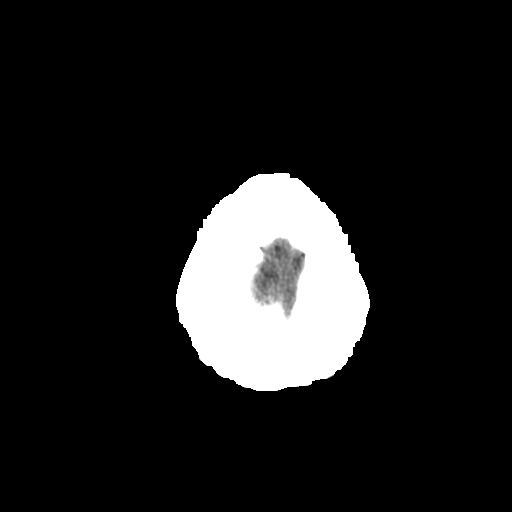

[Series 3: head bone · axial · 0.42mm/px · z∈[-587,-559]mm · 3 of 73 slices shown]
[im 8/73  bone]
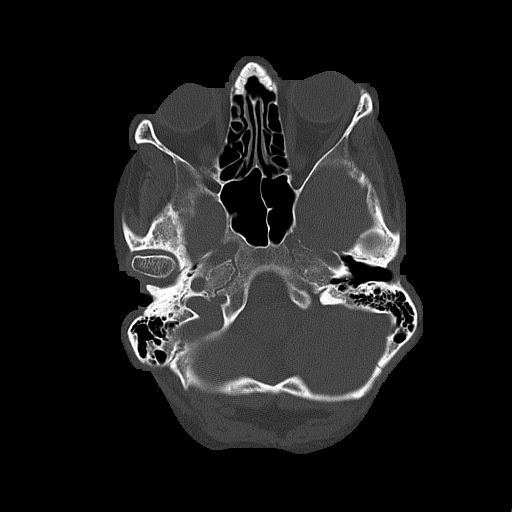
[im 15/73  bone]
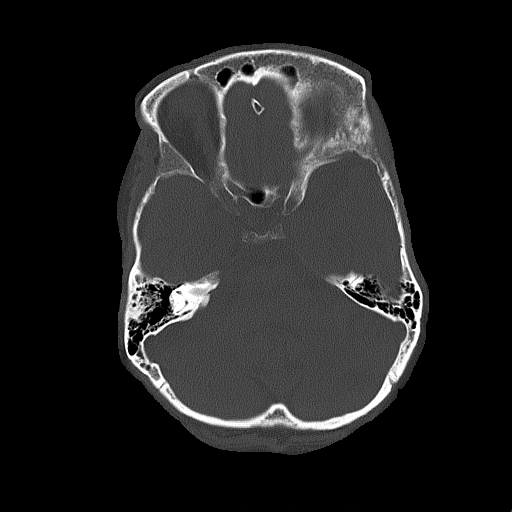
[im 22/73  bone]
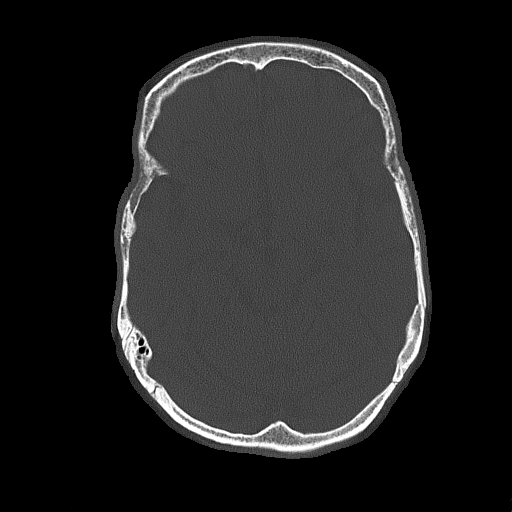

[Series 4: coronal soft tissue · coronal · 0.29mm/px · 3 of 63 slices shown]
[im 21/63  brain]
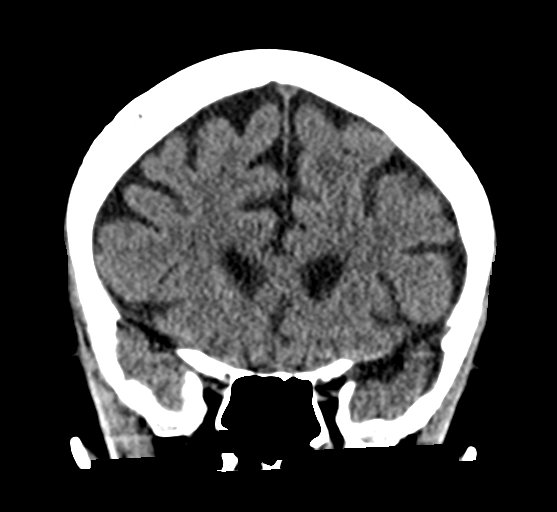
[im 28/63  brain]
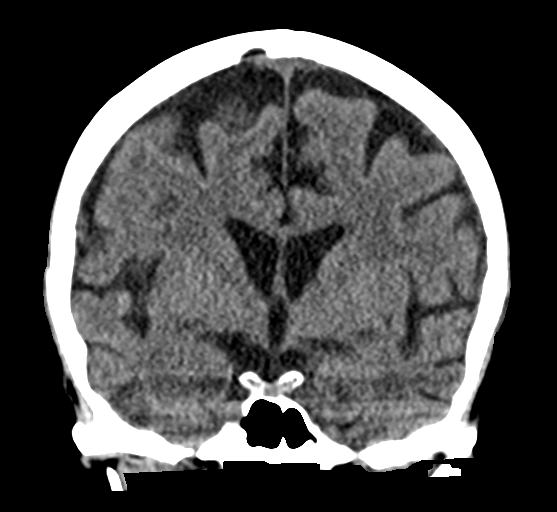
[im 35/63  brain]
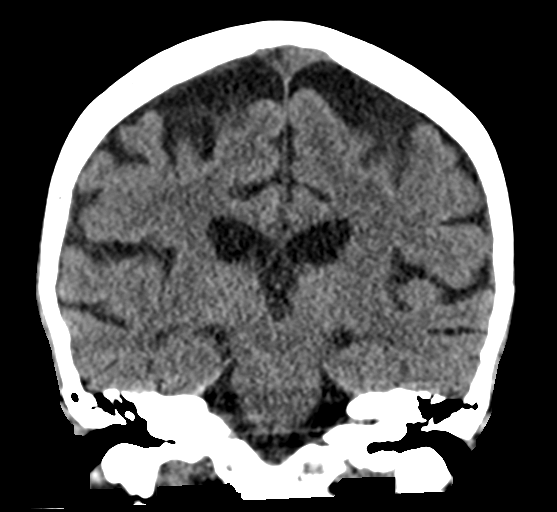

[Series 5: sagittal soft tissue · sagittal · 0.31mm/px · 3 of 54 slices shown]
[im 18/54  brain]
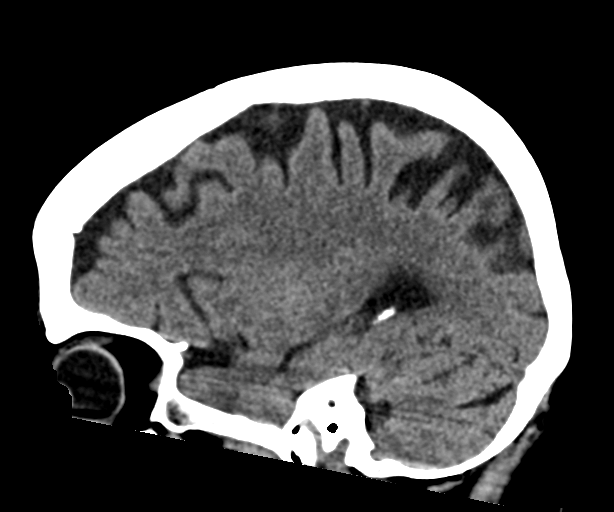
[im 27/54  brain]
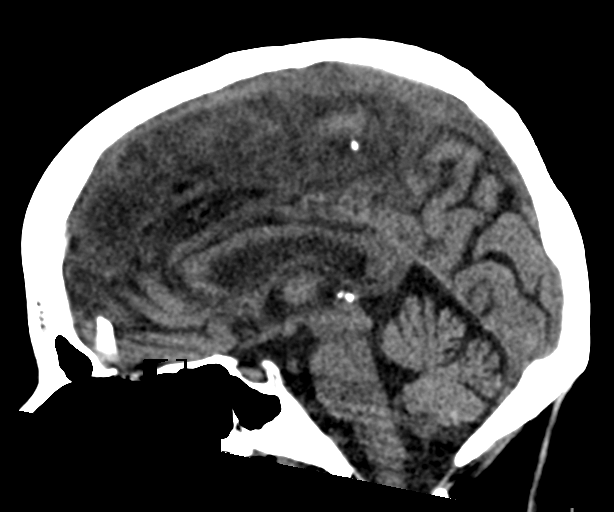
[im 36/54  brain]
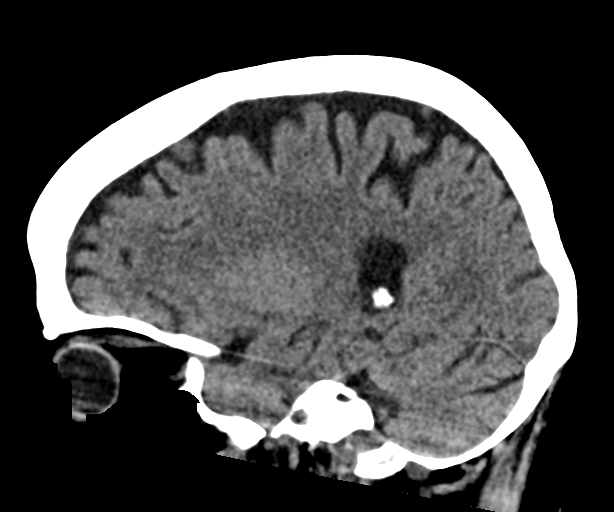

[16 of 47 positions shown; findings below may reference images not displayed]

FINDINGS: Brain: No evidence of acute infarction, hemorrhage, extra-axial
collection, ventriculomegaly, or mass effect. Generalized cerebral
atrophy. Periventricular white matter low attenuation likely
secondary to microangiopathy.

Vascular: No hyperdense vessel. No significant cerebrovascular
atherosclerotic disease.

Skull: Negative for fracture or focal lesion.

Sinuses/Orbits: Visualized portions of the orbits are unremarkable.
Visualized portions of the paranasal sinuses are unremarkable.
Visualized portions of the mastoid air cells are unremarkable.

Other: None.
IMPRESSION: 1. No acute intracranial pathology.
2. Generalized cerebral atrophy and microangiopathy.
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
## 2024-03-27 ENCOUNTER — Other Ambulatory Visit: Payer: Self-pay

## 2024-03-27 ENCOUNTER — Emergency Department
Admission: EM | Admit: 2024-03-27 | Discharge: 2024-03-27 | Disposition: A | Discharge status: Home / Self Care | Attending: Emergency Medicine | Admitting: Emergency Medicine

## 2024-03-27 DIAGNOSIS — N939 Abnormal uterine and vaginal bleeding, unspecified: Secondary | ICD-10-CM | POA: Insufficient documentation

## 2024-03-27 DIAGNOSIS — G40909 Epilepsy, unspecified, not intractable, without status epilepticus: Secondary | ICD-10-CM

## 2024-03-27 DIAGNOSIS — R569 Unspecified convulsions: Secondary | ICD-10-CM | POA: Diagnosis not present

## 2024-03-27 LAB — CBC
HCT: 37 % (ref 36.0–46.0)
Hemoglobin: 13.3 g/dL (ref 12.0–15.0)
MCH: 32.4 pg (ref 26.0–34.0)
MCHC: 35.9 g/dL (ref 30.0–36.0)
MCV: 90 fL (ref 80.0–100.0)
Platelets: 204 K/uL (ref 150–400)
RBC: 4.11 MIL/uL (ref 3.87–5.11)
RDW: 14.5 % (ref 11.5–15.5)
WBC: 7.2 K/uL (ref 4.0–10.5)
nRBC: 0 % (ref 0.0–0.2)

## 2024-03-27 LAB — BASIC METABOLIC PANEL WITH GFR
Anion gap: 13 (ref 5–15)
BUN: 10 mg/dL (ref 6–20)
CO2: 23 mmol/L (ref 22–32)
Calcium: 9.3 mg/dL (ref 8.9–10.3)
Chloride: 94 mmol/L — ABNORMAL LOW (ref 98–111)
Creatinine, Ser: 0.52 mg/dL (ref 0.44–1.00)
GFR, Estimated: >60 mL/min (ref >60)
Glucose, Bld: 95 mg/dL (ref 70–99)
Potassium: 3.6 mmol/L (ref 3.5–5.1)
Sodium: 130 mmol/L — ABNORMAL LOW (ref 135–145)

## 2024-03-27 LAB — URINALYSIS, ROUTINE W REFLEX MICROSCOPIC
Bilirubin Urine: NEGATIVE
Glucose, UA: NEGATIVE mg/dL
Hgb urine dipstick: NEGATIVE
Ketones, ur: 5 mg/dL — AB
Leukocytes,Ua: NEGATIVE
Nitrite: NEGATIVE
Protein, ur: NEGATIVE mg/dL
Specific Gravity, Urine: 1.004 — ABNORMAL LOW (ref 1.005–1.030)
pH: 7 (ref 5.0–8.0)

## 2024-03-27 LAB — POC URINE PREG, ED: Preg Test, Ur: NEGATIVE

## 2024-03-27 LAB — VALPROIC ACID LEVEL: Valproic Acid Lvl: 119 ug/mL — ABNORMAL HIGH (ref 50–100)

## 2024-03-27 MED ORDER — VALPROATE SODIUM 100 MG/ML IV SOLN
1000.0000 mg | INTRAVENOUS | Status: AC
  Administered 2024-03-27: 1000 mg via INTRAVENOUS
  Filled 2024-03-27: qty 10

## 2024-03-27 MED ORDER — MIDAZOLAM HCL 5 MG/5ML IJ SOLN
2.5000 mg | Freq: Once | INTRAMUSCULAR | Status: AC
  Administered 2024-03-27: 2.5 mg via INTRAVENOUS
  Filled 2024-03-27: qty 5

## 2024-03-27 MED ORDER — LEVETIRACETAM (KEPPRA) 500 MG/5 ML ADULT IV PUSH: 1000.0000 mg | INTRAVENOUS | Status: DC

## 2024-03-27 MED ORDER — SODIUM CHLORIDE 0.9 % IV SOLN
200.0000 mg | INTRAVENOUS | Status: AC
  Administered 2024-03-27: 200 mg via INTRAVENOUS
  Filled 2024-03-27: qty 20

## 2024-03-27 MED ORDER — CLOBAZAM 5 MG PO HALF TABLET
15.0000 mg | ORAL_TABLET | ORAL | Status: AC
  Administered 2024-03-27: 15 mg via ORAL
  Filled 2024-03-27: qty 3
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
## 2024-03-27 NOTE — ED Notes (Signed)
 Messaged pharmacy for IV Vimpat . Soft mitts and bed alarm placed.

## 2024-03-27 NOTE — ED Notes (Signed)
 Placed pt on bedpan. Pt is alert now.

## 2024-03-27 NOTE — ED Notes (Signed)
Clean brief placed

## 2024-03-27 NOTE — Discharge Instructions (Addendum)
 Please follow-up closely with your neurologist call the clinic tomorrow.  No driving.  Please adhere to your current medication schedule.  Please also call our OB/GYN clinic to set up follow-up for the abnormal uterine bleeding you have been experiencing over the last few weeks.  Return to the ER right away if you have heavy or severe bleeding, develop abdominal pain, nausea vomiting fever or other concerns arise.

## 2024-03-27 NOTE — ED Notes (Signed)
 Husband at bedside.

## 2024-03-27 NOTE — ED Notes (Signed)
 ED Provider at bedside.

## 2024-03-27 NOTE — ED Notes (Signed)
 Seizure pads in place. Husband at bedside. U/S IV placed. Pt is becoming more and more oriented. Initially was wrong about the year but corrected herself.   Dr Dicky at bedside.

## 2024-03-27 NOTE — ED Provider Notes (Signed)
 [facility] Provider Note    Event Date/Time   First MD Initiated Contact with Patient 03/27/24 0930     (approximate)   History   Seizures   HPI  Gabriela White is a 43 y.o. female history of hyponatremia chronic, as well as epilepsy   Today she had gone to the grocery store was in her normal health.  Last time she had a seizure was in May.  When she was out in the parking lot she started acting abnormally tried to get in her own car and then became confused.  Her husband was not with her but received a call from the police department.  EMS was called to see her and she was very agitated and confused, concern for postictal versus active seizure received 2-1/2 mg of midazolam   On arrival the patient is awake alert recognizes her husband is pleasant recalls going to the grocery store but then does not know what happened thereafter.  Feels like she may have had a seizure.  She does have a vagal nerve stimulator sees Scientist, forensic.  She tells me she has been taking her prescribed medication but has not had any of her morning medications yet today but otherwise is not missing doses following with neurology she feels like she had a seizure  Patient denies pregnancy.  Husband reports she has had previous tubal ligation as well.  Physical Exam   Triage Vital Signs: ED Triage Vitals  Encounter Vitals Group     BP --      Girls Systolic BP Percentile --      Girls Diastolic BP Percentile --      Boys Systolic BP Percentile --      Boys Diastolic BP Percentile --      Pulse Rate 03/27/24 0924 77     Resp 03/27/24 0924 20     Temp 03/27/24 0927 98.5 F (36.9 C)     Temp Source 03/27/24 0924 Oral     SpO2 03/27/24 0924 100 %     Weight 03/27/24 0925 132 lb (59.9 kg)     Height 03/27/24 0925 5' 2 (1.575 m)     Head Circumference --      Peak Flow --      Pain Score 03/27/24 0924 0     Pain Loc --      Pain Education --      Exclude from Growth  Chart --     Most recent vital signs: Vitals:   03/27/24 1130 03/27/24 1330  BP: 128/80 110/69  Pulse: 73 64  Resp: 13 15  Temp:    SpO2: 100% 97%     General: Awake, no distress.  Very pleasant oriented recognizes husband and able to give good medical history reports no preceding illness felt well enough to go to the grocery store by herself today CV:  Good peripheral perfusion.  Normal tones and rate Resp:  Normal effort.  Clear bilateral Abd:  No distention.  Soft nontender not Other:  Extraocular movement normal, clear speech moves all extremities well without deficit.   After being in the room with her for about 5 minutes, the patient started to look slightly diaphoretic and then started looking up and around her sort of staring off into space husband identifies this as one of her seizures and she did this for about 5 minutes, improved resting comfortably without further seizure activity after receiving additional dose of midazolam .  No generalized tonic-clonic  activity, no aspiration or airway or hypoxic episodes occurring during this period.  Dr. Loa consulted   ED Results / Procedures / Treatments   Labs (all labs ordered are listed, but only abnormal results are displayed) Labs Reviewed  BASIC METABOLIC PANEL WITH GFR - Abnormal; Notable for the following components:      Result Value   Sodium 130 (*)    Chloride 94 (*)    All other components within normal limits  URINALYSIS, ROUTINE W REFLEX MICROSCOPIC - Abnormal; Notable for the following components:   Color, Urine STRAW (*)    APPearance CLEAR (*)    Specific Gravity, Urine 1.004 (*)    Ketones, ur 5 (*)    All other components within normal limits  VALPROIC  ACID LEVEL - Abnormal; Notable for the following components:   Valproic  Acid Lvl 119 (*)    All other components within normal limits  CBC  POC URINE PREG, ED     EKG  Interpreted by me at 930 heart rate 80 QRS 100 QTc 410 Slight artifact  normal sinus no evidence of acute ischemia.   RADIOLOGY  No noted indication for imaging at this time.  No trauma, normocephalic atraumatic back to her normal mentation during my initial examination.  She also has had extensive workup and imaging for seizures without established diagnosis   PROCEDURES:  Critical Care performed: Yes, see critical care procedure note(s)  CRITICAL CARE Performed by: Oneil Budge   Total critical care time: 30 minutes  Critical care time was exclusive of separately billable procedures and treating other patients.  Critical care was necessary to treat or prevent imminent or life-threatening deterioration.  Critical care was time spent personally by me on the following activities: development of treatment plan with patient and/or surrogate as well as nursing, discussions with consultants, evaluation of patient's response to treatment, examination of patient, obtaining history from patient or surrogate, ordering and performing treatments and interventions, ordering and review of laboratory studies, ordering and review of radiographic studies, pulse oximetry and re-evaluation of patient's condition.   Procedures   MEDICATIONS ORDERED IN ED: Medications  midazolam  (VERSED ) 5 MG/5ML injection 2.5 mg (2.5 mg Intravenous Given 03/27/24 0951)  lacosamide  (VIMPAT ) 200 mg in sodium chloride  0.9 % 25 mL IVPB (0 mg Intravenous Stopped 03/27/24 1053)  valproate (DEPACON ) 1,000 mg in dextrose  5 % 50 mL IVPB (0 mg Intravenous Stopped 03/27/24 1157)  cloBAZam  (ONFI ) tablet 15 mg (15 mg Oral Given 03/27/24 1338)     IMPRESSION / MDM / ASSESSMENT AND PLAN / ED COURSE  I reviewed the triage vital signs and the nursing notes.                              Differential diagnosis includes, but is not limited to, seizure, complex epilepsy, medication nonadherence, metabolic abnormality provoking factors such as lack of sleep infection etc. considered.  Patient denies any  recent illnesses felt well on her way to the store.  Has a history of similar seizures per husband.  After receiving midazolam  here as well as antiepileptics all seizure activity has abated.  Lab work reassuring.  She is returned to her normal mentation at baseline.  Patient's presentation is most consistent with acute complicated illness / injury requiring diagnostic workup.      Clinical Course as of 03/27/24 1414  Sun Mar 27, 2024  9045 Dr. Matthews paged for neurology consult at this time.  Also sent message via haiku requesting consultation [MQ]  1003 Dr. Loa discussed case with me.  Based on patient's previous history and patient not having had her medication yet today recommendation for Vimpat  200 mg Depakote  1000 mg both IV.  Benzodiazepines as needed.  Dr. Matthews to provide consultation shortly [MQ]  1110 Labs interpreted as normal CBC and metabolic panel in the context of patient's chronic hyponatremia. [MQ]    Clinical Course User Index [MQ] Dicky Anes, MD   ----------------------------------------- 12:28 PM on 03/27/2024 ----------------------------------------- Dr. Elida Matthews at bedside  ----------------------------------------- 1:06 PM on 03/27/2024 ----------------------------------------- Neurology recommendation given to me verbally as continue current antiepileptic therapy.  Check urinalysis and if infection recommend treatment.  Of note the patient does not have any obvious acute urinary symptoms but given her significant epilepsy it is recommended to exclude this.  Additionally patient reported that she has had some intermittent vaginal bleeding for the past few weeks to the neurologist.  She has previous tubal ligation and 4 years of preceding amenorrhea.  Will check urine pregnancy test, referral recommended to follow-up with OB/GYN for abnormal bleeding [does not appear to be a present cause related to the patient's presentation for seizure concern today, and I think  could be well addressed outpatient at this point]  Discussed with patient and agreeable to follow-up with OB/GYN.  Further clarifies that she has had slight sporadic need for use of menstrual pads for at least a few weeks but no heavy or severe bleeding no abdominal pain.  She is awake alert well-oriented.  Understands not to drive but knows she cannot drive until she is released to do so by her neurologist.  Husband driving. Return precautions and treatment recommendations and follow-up discussed with the patient who is agreeable with the plan.     FINAL CLINICAL IMPRESSION(S) / ED DIAGNOSES   Final diagnoses:  Seizure disorder (HCC)  Abnormal uterine bleeding     Rx / DC Orders   ED Discharge Orders     None        Note:  This document was prepared using Dragon voice recognition software and may include unintentional dictation errors.   Dicky Anes, MD 03/27/24 1414

## 2024-03-27 NOTE — ED Notes (Signed)
 Hospitalist at bedside

## 2024-03-27 NOTE — ED Notes (Signed)
 Pt had another seizure while EDP in room. (Absence seizure). Verbal order for 2.5mg  versed . Given now. Pt was again combative and trying to rip out IV and bite. Husband helped to hold hands to allow RN to give Versed . Will place soft mitts and bed alarm. Seizure pads are in place.

## 2024-05-13 ENCOUNTER — Emergency Department

## 2024-05-13 ENCOUNTER — Other Ambulatory Visit: Payer: Self-pay

## 2024-05-13 ENCOUNTER — Inpatient Hospital Stay
Admission: EM | Admit: 2024-05-13 | Discharge: 2024-05-19 | DRG: 644 | Disposition: A | Discharge status: Home / Self Care | Attending: Student | Admitting: Student

## 2024-05-13 DIAGNOSIS — D696 Thrombocytopenia, unspecified: Secondary | ICD-10-CM | POA: Diagnosis present

## 2024-05-13 DIAGNOSIS — R112 Nausea with vomiting, unspecified: Principal | ICD-10-CM | POA: Diagnosis present

## 2024-05-13 DIAGNOSIS — G40909 Epilepsy, unspecified, not intractable, without status epilepticus: Secondary | ICD-10-CM | POA: Diagnosis present

## 2024-05-13 DIAGNOSIS — E860 Dehydration: Secondary | ICD-10-CM | POA: Diagnosis present

## 2024-05-13 DIAGNOSIS — J410 Simple chronic bronchitis: Secondary | ICD-10-CM | POA: Diagnosis present

## 2024-05-13 DIAGNOSIS — Z833 Family history of diabetes mellitus: Secondary | ICD-10-CM

## 2024-05-13 DIAGNOSIS — F1210 Cannabis abuse, uncomplicated: Secondary | ICD-10-CM | POA: Diagnosis present

## 2024-05-13 DIAGNOSIS — K529 Noninfective gastroenteritis and colitis, unspecified: Secondary | ICD-10-CM | POA: Diagnosis present

## 2024-05-13 DIAGNOSIS — Z59868 Other specified financial insecurity: Secondary | ICD-10-CM

## 2024-05-13 DIAGNOSIS — E222 Syndrome of inappropriate secretion of antidiuretic hormone: Principal | ICD-10-CM | POA: Diagnosis present

## 2024-05-13 DIAGNOSIS — D72829 Elevated white blood cell count, unspecified: Secondary | ICD-10-CM | POA: Diagnosis present

## 2024-05-13 DIAGNOSIS — Z885 Allergy status to narcotic agent status: Secondary | ICD-10-CM

## 2024-05-13 DIAGNOSIS — Z8249 Family history of ischemic heart disease and other diseases of the circulatory system: Secondary | ICD-10-CM

## 2024-05-13 DIAGNOSIS — R569 Unspecified convulsions: Secondary | ICD-10-CM

## 2024-05-13 DIAGNOSIS — N390 Urinary tract infection, site not specified: Secondary | ICD-10-CM | POA: Diagnosis present

## 2024-05-13 DIAGNOSIS — Z887 Allergy status to serum and vaccine status: Secondary | ICD-10-CM

## 2024-05-13 DIAGNOSIS — W19XXXA Unspecified fall, initial encounter: Secondary | ICD-10-CM

## 2024-05-13 DIAGNOSIS — I5022 Chronic systolic (congestive) heart failure: Secondary | ICD-10-CM | POA: Diagnosis present

## 2024-05-13 DIAGNOSIS — E871 Hypo-osmolality and hyponatremia: Secondary | ICD-10-CM | POA: Diagnosis present

## 2024-05-13 DIAGNOSIS — Z79899 Other long term (current) drug therapy: Secondary | ICD-10-CM

## 2024-05-13 DIAGNOSIS — I10 Essential (primary) hypertension: Secondary | ICD-10-CM | POA: Diagnosis present

## 2024-05-13 DIAGNOSIS — F1010 Alcohol abuse, uncomplicated: Secondary | ICD-10-CM | POA: Diagnosis present

## 2024-05-13 DIAGNOSIS — F17200 Nicotine dependence, unspecified, uncomplicated: Secondary | ICD-10-CM | POA: Diagnosis present

## 2024-05-13 DIAGNOSIS — T68XXXA Hypothermia, initial encounter: Secondary | ICD-10-CM | POA: Diagnosis present

## 2024-05-13 DIAGNOSIS — B9620 Unspecified Escherichia coli [E. coli] as the cause of diseases classified elsewhere: Secondary | ICD-10-CM | POA: Diagnosis present

## 2024-05-13 DIAGNOSIS — I110 Hypertensive heart disease with heart failure: Secondary | ICD-10-CM | POA: Diagnosis present

## 2024-05-13 DIAGNOSIS — Y92009 Unspecified place in unspecified non-institutional (private) residence as the place of occurrence of the external cause: Secondary | ICD-10-CM

## 2024-05-13 DIAGNOSIS — K219 Gastro-esophageal reflux disease without esophagitis: Secondary | ICD-10-CM | POA: Diagnosis present

## 2024-05-13 DIAGNOSIS — E8721 Acute metabolic acidosis: Secondary | ICD-10-CM | POA: Diagnosis present

## 2024-05-13 DIAGNOSIS — E876 Hypokalemia: Secondary | ICD-10-CM | POA: Diagnosis not present

## 2024-05-13 DIAGNOSIS — F17210 Nicotine dependence, cigarettes, uncomplicated: Secondary | ICD-10-CM | POA: Diagnosis present

## 2024-05-13 LAB — CBC WITH DIFFERENTIAL/PLATELET
Abs Immature Granulocytes: 0.17 K/uL — ABNORMAL HIGH (ref 0.00–0.07)
Basophils Absolute: 0.1 K/uL (ref 0.0–0.1)
Basophils Relative: 1 %
Eosinophils Absolute: 0 K/uL (ref 0.0–0.5)
Eosinophils Relative: 0 %
HCT: 36.1 % (ref 36.0–46.0)
Hemoglobin: 12.8 g/dL (ref 12.0–15.0)
Immature Granulocytes: 1 %
Lymphocytes Relative: 4 %
Lymphs Abs: 0.5 K/uL — ABNORMAL LOW (ref 0.7–4.0)
MCH: 33 pg (ref 26.0–34.0)
MCHC: 35.5 g/dL (ref 30.0–36.0)
MCV: 93 fL (ref 80.0–100.0)
Monocytes Absolute: 2.4 K/uL — ABNORMAL HIGH (ref 0.1–1.0)
Monocytes Relative: 17 %
Neutro Abs: 11 K/uL — ABNORMAL HIGH (ref 1.7–7.7)
Neutrophils Relative %: 77 %
Platelets: UNDETERMINED K/uL (ref 150–400)
RBC: 3.88 MIL/uL (ref 3.87–5.11)
RDW: 13.6 % (ref 11.5–15.5)
WBC: 14.1 K/uL — ABNORMAL HIGH (ref 4.0–10.5)
nRBC: 0 % (ref 0.0–0.2)

## 2024-05-13 LAB — COMPREHENSIVE METABOLIC PANEL WITH GFR
ALT: 25 U/L (ref 0–44)
AST: 37 U/L (ref 15–41)
Albumin: 2.9 g/dL — ABNORMAL LOW (ref 3.5–5.0)
Alkaline Phosphatase: 62 U/L (ref 38–126)
Anion gap: 12 (ref 5–15)
BUN: 22 mg/dL — ABNORMAL HIGH (ref 6–20)
CO2: 20 mmol/L — ABNORMAL LOW (ref 22–32)
Calcium: 8 mg/dL — ABNORMAL LOW (ref 8.9–10.3)
Chloride: 90 mmol/L — ABNORMAL LOW (ref 98–111)
Creatinine, Ser: 0.85 mg/dL (ref 0.44–1.00)
GFR, Estimated: >60 mL/min (ref >60)
Glucose, Bld: 92 mg/dL (ref 70–99)
Potassium: 3.1 mmol/L — ABNORMAL LOW (ref 3.5–5.1)
Sodium: 122 mmol/L — ABNORMAL LOW (ref 135–145)
Total Bilirubin: 0.6 mg/dL (ref 0.0–1.2)
Total Protein: 6.2 g/dL — ABNORMAL LOW (ref 6.5–8.1)

## 2024-05-13 LAB — LACTIC ACID, PLASMA: Lactic Acid, Venous: 1.5 mmol/L (ref 0.5–1.9)

## 2024-05-13 LAB — TSH: TSH: 1.526 u[IU]/mL (ref 0.350–4.500)

## 2024-05-13 MED ORDER — SODIUM CHLORIDE 0.9 % IV BOLUS
1000.0000 mL | Freq: Once | INTRAVENOUS | Status: AC
Start: 2024-05-13 — End: 2024-05-14
  Administered 2024-05-13: 1000 mL via INTRAVENOUS

## 2024-05-13 MED ORDER — ONDANSETRON HCL 4 MG/2ML IJ SOLN
4.0000 mg | Freq: Once | INTRAMUSCULAR | Status: AC
  Administered 2024-05-13: 4 mg via INTRAVENOUS
  Filled 2024-05-13: qty 2

## 2024-05-13 MED ORDER — POTASSIUM CHLORIDE 10 MEQ/100ML IV SOLN
10.0000 meq | INTRAVENOUS | Status: AC
  Administered 2024-05-14: 10 meq via INTRAVENOUS
  Filled 2024-05-13: qty 100

## 2024-05-13 NOTE — ED Provider Notes (Signed)
 [facility] Provider Note    Event Date/Time   First MD Initiated Contact with Patient 05/13/24 2138     (approximate)   History   Diarrhea (/)   HPI  Gabriela White is a 43 y.o. female who presents to the emergency department today because concerns for 24 hours of nausea vomiting and diarrhea.  History is primarily obtained from family at bedside.  They deny any sick contacts.  Denies any unusual ingestions.  Patient denies any associated abdominal pain.  No fevers although has had some chills. Patient has history of epilepsy, last seizure roughly 1 month ago.      Physical Exam   Triage Vital Signs: ED Triage Vitals  Encounter Vitals Group     BP 05/13/24 2121 (!) 104/59     Girls Systolic BP Percentile --      Girls Diastolic BP Percentile --      Boys Systolic BP Percentile --      Boys Diastolic BP Percentile --      Pulse Rate 05/13/24 2121 (!) 57     Resp 05/13/24 2121 18     Temp 05/13/24 2130 (!) 94.7 F (34.8 C)     Temp Source 05/13/24 2130 Rectal     SpO2 05/13/24 2121 100 %     Weight --      Height --      Head Circumference --      Peak Flow --      Pain Score 05/13/24 2118 0     Pain Loc --      Pain Education --      Exclude from Growth Chart --     Most recent vital signs: Vitals:   05/13/24 2121 05/13/24 2130  BP: (!) 104/59   Pulse: (!) 57   Resp: 18   Temp:  (!) 94.7 F (34.8 C)  SpO2: 100%    General: Awake, alert, oriented. CV:  Good peripheral perfusion. Regular rate and rhythm. Resp:  Normal effort. Lungs clear Abd:  No distention. Non tender.   ED Results / Procedures / Treatments   Labs (all labs ordered are listed, but only abnormal results are displayed) Labs Reviewed  LACTIC ACID, PLASMA  LACTIC ACID, PLASMA  COMPREHENSIVE METABOLIC PANEL WITH GFR  CBC WITH DIFFERENTIAL/PLATELET  URINALYSIS, W/ REFLEX TO CULTURE (INFECTION SUSPECTED)     EKG  I, Guadalupe Eagles, attending  physician, personally viewed and interpreted this EKG  EKG Time: 2124 Rate: 60 Rhythm: sinus rhythm Axis: right axis deviation Intervals: qtc 303 QRS: narrow ST changes: no st elevation Impression: abnormal ekg  RADIOLOGY None   PROCEDURES:  Critical Care performed: No    MEDICATIONS ORDERED IN ED: Medications - No data to display   IMPRESSION / MDM / ASSESSMENT AND PLAN / ED COURSE  I reviewed the triage vital signs and the nursing notes.                              Differential diagnosis includes, but is not limited to, gastroenteritis, SBO, pancreatitis, hepatitis  Patient's presentation is most consistent with acute presentation with potential threat to life or bodily function.   The patient is on the cardiac monitor to evaluate for evidence of arrhythmia and/or significant heart rate changes.  Patient presented to the emergency department today because of concerns for 24-hour history of nausea and vomiting.  On exam patient is awake  and alert.  Notably she is quite hypothermic.  Abdomen is benign without any tenderness.  Will check blood work including lactic acid given hypothermia.  Additionally will get TSH.  Will give IV fluids and nausea medication.  Blood work shows significant hyponatremia and some hypokalemia. Discussed this with patient and family at bedside. Will start repletion in the emergency department. Awaiting other blood work at time of sign out.   FINAL CLINICAL IMPRESSION(S) / ED DIAGNOSES   Final diagnoses:  Nausea and vomiting, unspecified vomiting type  Hyponatremia  Hypothermia, initial encounter  Hypokalemia         Note:  This document was prepared using Dragon voice recognition software and may include unintentional dictation errors.    Floy Roberts, MD 05/13/24 2300

## 2024-05-13 NOTE — ED Provider Notes (Signed)
 Care assumed of patient from outgoing provider.  See their note for initial history, exam and plan.  Clinical Course as of 05/13/24 2316  Fri May 13, 2024  2314 N/v, significant hypothermia on arrival.  Hyponatremia to 122. Given IVF and potassium replacement. Plan for admission for dehydration and viral gastroenteritis.  [SM]    Clinical Course User Index [SM] Suzanne Kirsch, MD     Suzanne Kirsch, MD 05/13/24 [phone]

## 2024-05-13 NOTE — ED Notes (Signed)
 Advised pt and family sitting with pt we need a urine specimen but I am going to wait and let her temp increase a little more before collecting it

## 2024-05-13 NOTE — ED Triage Notes (Signed)
 Pt with nausea, vomiting and diarrhea x24 hrs. Per family had multiple episodes where she seemed post-ictal but did not have a witnessed seizure. Endorsing chills (no known fevers) and generalized weakness. Decreased PO intake, 20 lbs weight loss in recent months. BGL 118

## 2024-05-14 ENCOUNTER — Observation Stay

## 2024-05-14 DIAGNOSIS — T68XXXA Hypothermia, initial encounter: Secondary | ICD-10-CM | POA: Diagnosis present

## 2024-05-14 DIAGNOSIS — Z8249 Family history of ischemic heart disease and other diseases of the circulatory system: Secondary | ICD-10-CM | POA: Diagnosis not present

## 2024-05-14 DIAGNOSIS — Z59868 Other specified financial insecurity: Secondary | ICD-10-CM | POA: Diagnosis not present

## 2024-05-14 DIAGNOSIS — J410 Simple chronic bronchitis: Secondary | ICD-10-CM | POA: Diagnosis present

## 2024-05-14 DIAGNOSIS — E876 Hypokalemia: Secondary | ICD-10-CM | POA: Diagnosis present

## 2024-05-14 DIAGNOSIS — F17200 Nicotine dependence, unspecified, uncomplicated: Secondary | ICD-10-CM

## 2024-05-14 DIAGNOSIS — R112 Nausea with vomiting, unspecified: Secondary | ICD-10-CM | POA: Diagnosis present

## 2024-05-14 DIAGNOSIS — Y92009 Unspecified place in unspecified non-institutional (private) residence as the place of occurrence of the external cause: Secondary | ICD-10-CM

## 2024-05-14 DIAGNOSIS — F1010 Alcohol abuse, uncomplicated: Secondary | ICD-10-CM | POA: Diagnosis present

## 2024-05-14 DIAGNOSIS — K219 Gastro-esophageal reflux disease without esophagitis: Secondary | ICD-10-CM | POA: Diagnosis present

## 2024-05-14 DIAGNOSIS — N390 Urinary tract infection, site not specified: Secondary | ICD-10-CM | POA: Diagnosis present

## 2024-05-14 DIAGNOSIS — D72829 Elevated white blood cell count, unspecified: Secondary | ICD-10-CM | POA: Diagnosis present

## 2024-05-14 DIAGNOSIS — W19XXXA Unspecified fall, initial encounter: Secondary | ICD-10-CM

## 2024-05-14 DIAGNOSIS — I10 Essential (primary) hypertension: Secondary | ICD-10-CM

## 2024-05-14 DIAGNOSIS — E871 Hypo-osmolality and hyponatremia: Secondary | ICD-10-CM

## 2024-05-14 DIAGNOSIS — Z833 Family history of diabetes mellitus: Secondary | ICD-10-CM | POA: Diagnosis not present

## 2024-05-14 DIAGNOSIS — Z79899 Other long term (current) drug therapy: Secondary | ICD-10-CM | POA: Diagnosis not present

## 2024-05-14 DIAGNOSIS — F1210 Cannabis abuse, uncomplicated: Secondary | ICD-10-CM | POA: Diagnosis present

## 2024-05-14 DIAGNOSIS — R569 Unspecified convulsions: Secondary | ICD-10-CM

## 2024-05-14 DIAGNOSIS — I110 Hypertensive heart disease with heart failure: Secondary | ICD-10-CM | POA: Diagnosis present

## 2024-05-14 DIAGNOSIS — B9620 Unspecified Escherichia coli [E. coli] as the cause of diseases classified elsewhere: Secondary | ICD-10-CM | POA: Diagnosis present

## 2024-05-14 DIAGNOSIS — D696 Thrombocytopenia, unspecified: Secondary | ICD-10-CM | POA: Diagnosis present

## 2024-05-14 DIAGNOSIS — Z887 Allergy status to serum and vaccine status: Secondary | ICD-10-CM | POA: Diagnosis not present

## 2024-05-14 DIAGNOSIS — E8721 Acute metabolic acidosis: Secondary | ICD-10-CM | POA: Diagnosis present

## 2024-05-14 DIAGNOSIS — F17210 Nicotine dependence, cigarettes, uncomplicated: Secondary | ICD-10-CM | POA: Diagnosis present

## 2024-05-14 DIAGNOSIS — K529 Noninfective gastroenteritis and colitis, unspecified: Secondary | ICD-10-CM | POA: Diagnosis present

## 2024-05-14 DIAGNOSIS — I5022 Chronic systolic (congestive) heart failure: Secondary | ICD-10-CM | POA: Diagnosis present

## 2024-05-14 DIAGNOSIS — E860 Dehydration: Secondary | ICD-10-CM | POA: Diagnosis present

## 2024-05-14 DIAGNOSIS — G40909 Epilepsy, unspecified, not intractable, without status epilepticus: Secondary | ICD-10-CM | POA: Diagnosis present

## 2024-05-14 DIAGNOSIS — E222 Syndrome of inappropriate secretion of antidiuretic hormone: Secondary | ICD-10-CM | POA: Diagnosis present

## 2024-05-14 DIAGNOSIS — Z885 Allergy status to narcotic agent status: Secondary | ICD-10-CM | POA: Diagnosis not present

## 2024-05-14 LAB — URINALYSIS, W/ REFLEX TO CULTURE (INFECTION SUSPECTED)
Bilirubin Urine: NEGATIVE
Glucose, UA: NEGATIVE mg/dL
Ketones, ur: 5 mg/dL — AB
Nitrite: NEGATIVE
Protein, ur: 30 mg/dL — AB
Specific Gravity, Urine: 1.011 (ref 1.005–1.030)
WBC, UA: >50 WBC/hpf (ref 0–5)
pH: 5 (ref 5.0–8.0)

## 2024-05-14 LAB — BASIC METABOLIC PANEL WITH GFR
Anion gap: 8 (ref 5–15)
Anion gap: 10 (ref 5–15)
BUN: 18 mg/dL (ref 6–20)
BUN: 14 mg/dL (ref 6–20)
CO2: 19 mmol/L — ABNORMAL LOW (ref 22–32)
CO2: 19 mmol/L — ABNORMAL LOW (ref 22–32)
Calcium: 7.6 mg/dL — ABNORMAL LOW (ref 8.9–10.3)
Calcium: 7.6 mg/dL — ABNORMAL LOW (ref 8.9–10.3)
Chloride: 93 mmol/L — ABNORMAL LOW (ref 98–111)
Chloride: 88 mmol/L — ABNORMAL LOW (ref 98–111)
Creatinine, Ser: 0.6 mg/dL (ref 0.44–1.00)
Creatinine, Ser: 0.66 mg/dL (ref 0.44–1.00)
GFR, Estimated: >60 mL/min (ref >60)
GFR, Estimated: >60 mL/min (ref >60)
Glucose, Bld: 80 mg/dL (ref 70–99)
Glucose, Bld: 77 mg/dL (ref 70–99)
Potassium: 3.7 mmol/L (ref 3.5–5.1)
Potassium: 3.4 mmol/L — ABNORMAL LOW (ref 3.5–5.1)
Sodium: 120 mmol/L — ABNORMAL LOW (ref 135–145)
Sodium: 117 mmol/L — CL (ref 135–145)

## 2024-05-14 LAB — OSMOLALITY: Osmolality: 250 mosm/kg — ABNORMAL LOW (ref 275–295)

## 2024-05-14 LAB — CBC
HCT: 31.5 % — ABNORMAL LOW (ref 36.0–46.0)
Hemoglobin: 11.7 g/dL — ABNORMAL LOW (ref 12.0–15.0)
MCH: 32.5 pg (ref 26.0–34.0)
MCHC: 37.1 g/dL — ABNORMAL HIGH (ref 30.0–36.0)
MCV: 87.5 fL (ref 80.0–100.0)
Platelets: 104 K/uL — ABNORMAL LOW (ref 150–400)
RBC: 3.6 MIL/uL — ABNORMAL LOW (ref 3.87–5.11)
RDW: 13.5 % (ref 11.5–15.5)
WBC: 17.2 K/uL — ABNORMAL HIGH (ref 4.0–10.5)
nRBC: 0 % (ref 0.0–0.2)

## 2024-05-14 LAB — URINE DRUG SCREEN, QUALITATIVE (ARMC ONLY)
Amphetamines, Ur Screen: NOT DETECTED
Barbiturates, Ur Screen: NOT DETECTED
Benzodiazepine, Ur Scrn: POSITIVE — AB
Cannabinoid 50 Ng, Ur ~~LOC~~: POSITIVE — AB
Cocaine Metabolite,Ur ~~LOC~~: NOT DETECTED
MDMA (Ecstasy)Ur Screen: NOT DETECTED
Methadone Scn, Ur: NOT DETECTED
Opiate, Ur Screen: NOT DETECTED
Phencyclidine (PCP) Ur S: NOT DETECTED
Tricyclic, Ur Screen: NOT DETECTED

## 2024-05-14 LAB — SODIUM, URINE, RANDOM: Sodium, Ur: <30 mmol/L

## 2024-05-14 LAB — SODIUM
Sodium: 119 mmol/L — CL (ref 135–145)
Sodium: 120 mmol/L — ABNORMAL LOW (ref 135–145)

## 2024-05-14 LAB — BRAIN NATRIURETIC PEPTIDE: B Natriuretic Peptide: 107.3 pg/mL — ABNORMAL HIGH (ref 0.0–100.0)

## 2024-05-14 LAB — HIV ANTIBODY (ROUTINE TESTING W REFLEX): HIV Screen 4th Generation wRfx: NONREACTIVE

## 2024-05-14 LAB — MAGNESIUM: Magnesium: 2.2 mg/dL (ref 1.7–2.4)

## 2024-05-14 LAB — OSMOLALITY, URINE: Osmolality, Ur: 352 mosm/kg (ref 300–900)

## 2024-05-14 LAB — PREGNANCY, URINE: Preg Test, Ur: NEGATIVE

## 2024-05-14 LAB — PHOSPHORUS: Phosphorus: 1.5 mg/dL — ABNORMAL LOW (ref 2.5–4.6)

## 2024-05-14 LAB — LACTIC ACID, PLASMA: Lactic Acid, Venous: 0.9 mmol/L (ref 0.5–1.9)

## 2024-05-14 MED ORDER — ACETAMINOPHEN 325 MG PO TABS: 650.0000 mg | ORAL_TABLET | Freq: Four times a day (QID) | ORAL | Status: DC | PRN

## 2024-05-14 MED ORDER — VALPROATE SODIUM 100 MG/ML IV SOLN
1000.0000 mg | Freq: Two times a day (BID) | INTRAVENOUS | Status: DC
  Administered 2024-05-14: 1000 mg via INTRAVENOUS
  Filled 2024-05-14 (×4): qty 10

## 2024-05-14 MED ORDER — ENSURE PLUS HIGH PROTEIN PO LIQD
237.0000 mL | Freq: Three times a day (TID) | ORAL | Status: DC
  Administered 2024-05-14: 237 mL via ORAL

## 2024-05-14 MED ORDER — ENSURE PLUS HIGH PROTEIN PO LIQD: 237.0000 mL | Freq: Three times a day (TID) | ORAL | Status: DC

## 2024-05-14 MED ORDER — LORAZEPAM 2 MG/ML IJ SOLN: 0.0000 mg | Freq: Two times a day (BID) | INTRAMUSCULAR | Status: DC

## 2024-05-14 MED ORDER — SODIUM CHLORIDE 1 G PO TABS
1.0000 g | ORAL_TABLET | Freq: Two times a day (BID) | ORAL | Status: DC
  Administered 2024-05-14 – 2024-05-19 (×12): 1 g via ORAL
  Filled 2024-05-14 (×12): qty 1

## 2024-05-14 MED ORDER — NICOTINE 21 MG/24HR TD PT24
21.0000 mg | MEDICATED_PATCH | Freq: Every day | TRANSDERMAL | Status: DC
  Administered 2024-05-14 (×2): 21 mg via TRANSDERMAL
  Filled 2024-05-14 (×5): qty 1

## 2024-05-14 MED ORDER — LORAZEPAM 1 MG PO TABS: 1.0000 mg | ORAL_TABLET | ORAL | Status: DC | PRN

## 2024-05-14 MED ORDER — HYDRALAZINE HCL 20 MG/ML IJ SOLN: 5.0000 mg | INTRAMUSCULAR | Status: DC | PRN

## 2024-05-14 MED ORDER — LORAZEPAM 2 MG/ML IJ SOLN: 1.0000 mg | INTRAMUSCULAR | Status: DC | PRN

## 2024-05-14 MED ORDER — THIAMINE HCL 100 MG/ML IJ SOLN
100.0000 mg | Freq: Every day | INTRAMUSCULAR | Status: DC
  Filled 2024-05-14: qty 2

## 2024-05-14 MED ORDER — LORAZEPAM 2 MG/ML IJ SOLN: 0.0000 mg | Freq: Four times a day (QID) | INTRAMUSCULAR | Status: DC

## 2024-05-14 MED ORDER — ONDANSETRON HCL 4 MG/2ML IJ SOLN: 4.0000 mg | Freq: Three times a day (TID) | INTRAMUSCULAR | Status: DC | PRN

## 2024-05-14 MED ORDER — POTASSIUM CHLORIDE 10 MEQ/100ML IV SOLN
10.0000 meq | Freq: Once | INTRAVENOUS | Status: AC
  Administered 2024-05-14: 10 meq via INTRAVENOUS
  Filled 2024-05-14: qty 100

## 2024-05-14 MED ORDER — ADULT MULTIVITAMIN W/MINERALS CH
1.0000 | ORAL_TABLET | Freq: Every day | ORAL | Status: DC
  Administered 2024-05-14 – 2024-05-19 (×6): 1 via ORAL
  Filled 2024-05-14 (×6): qty 1

## 2024-05-14 MED ORDER — DIVALPROEX SODIUM 500 MG PO DR TAB
1000.0000 mg | DELAYED_RELEASE_TABLET | Freq: Two times a day (BID) | ORAL | Status: DC
  Administered 2024-05-14 – 2024-05-19 (×11): 1000 mg via ORAL
  Filled 2024-05-14 (×12): qty 2

## 2024-05-14 MED ORDER — POTASSIUM CHLORIDE CRYS ER 20 MEQ PO TBCR
40.0000 meq | EXTENDED_RELEASE_TABLET | Freq: Once | ORAL | Status: AC
  Administered 2024-05-14: 40 meq via ORAL
  Filled 2024-05-14: qty 2

## 2024-05-14 MED ORDER — LACOSAMIDE 50 MG PO TABS
200.0000 mg | ORAL_TABLET | Freq: Two times a day (BID) | ORAL | Status: DC
Start: 2024-05-14 — End: 2024-05-19
  Administered 2024-05-14 – 2024-05-19 (×11): 200 mg via ORAL
  Filled 2024-05-14 (×11): qty 4

## 2024-05-14 MED ORDER — THIAMINE MONONITRATE 100 MG PO TABS
100.0000 mg | ORAL_TABLET | Freq: Every day | ORAL | Status: DC
  Administered 2024-05-14 – 2024-05-19 (×6): 100 mg via ORAL
  Filled 2024-05-14 (×6): qty 1

## 2024-05-14 MED ORDER — SODIUM CHLORIDE 0.9 % IV SOLN
1.0000 g | INTRAVENOUS | Status: DC
  Administered 2024-05-14 – 2024-05-18 (×5): 1 g via INTRAVENOUS
  Filled 2024-05-14 (×6): qty 10

## 2024-05-14 MED ORDER — CLOBAZAM 5 MG PO HALF TABLET
15.0000 mg | ORAL_TABLET | Freq: Two times a day (BID) | ORAL | Status: DC
  Administered 2024-05-14 – 2024-05-19 (×11): 15 mg via ORAL
  Filled 2024-05-14 (×14): qty 1

## 2024-05-14 MED ORDER — LORAZEPAM 2 MG/ML IJ SOLN
2.0000 mg | INTRAMUSCULAR | Status: DC | PRN
Start: 2024-05-14 — End: 2024-05-17

## 2024-05-14 MED ORDER — FOLIC ACID 1 MG PO TABS
1.0000 mg | ORAL_TABLET | Freq: Every day | ORAL | Status: DC
  Administered 2024-05-14 – 2024-05-19 (×6): 1 mg via ORAL
  Filled 2024-05-14 (×6): qty 1

## 2024-05-14 NOTE — Hospital Course (Addendum)
   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 Multiple potential contributing factors: low intake essentially tea/toast type diet, neuro/psych medications, likely GI losses w/ diarrhea, recent N/V question if GI problem caused sodium loss or if low sodium caused N/V symptoms,  Current labs:  Serum Na: 120-122 Serum Osm: 250 (low) Urine Na: <30 Urine Osm: 352 Other: TSH WNL.  sodium down to 117 --> Strict 1200 mL fluid restriction --> Adding Ensure Will need w/u glucocorticoid deficiency --> AM cortisol Question if need renal wasting workup, urine sodium is low but her intake is so low that may not be much to waste --> nephrology consult  --> Continue frequent sodium checks  Given chronicity, do not think 3% saline is warranted --> confirm w/ nephrology   UTI --> await culture  --> ceftriaxone dose x1  Hypothermia - resolved --> monitor vitals   Cannabinoid (+)UDS ?hyperemesis syndrome but nausea is not a chronic issue for her  --> avoid use recreational substances    Patient with seizure activity outside of alcohol use 06/27/2022.  Witnessed by husband who described generalized rhythmic shaking of all limbs when she is asleep. Patient follows with [facility] neurology and NSG. Currently prescribed Depakote , Vimpat , and Onfi . Neurology to manage seizure medications. S/p vagal nerve stimulator implanted 08/2023, improved seizures.  Home meds restarted all po  --> Depakote  1000 mg BID --> Vimpat  200 mg BID --> Onfi  20 mg BID

## 2024-05-14 NOTE — ED Notes (Signed)
 This RN just took over care for this patient time. This RN introduced self to patient and placed patient back on BP monitor. Patient is currently on the [employer] No rectal probe in at this time, so this RN will check a new rectal temp.

## 2024-05-14 NOTE — ED Notes (Signed)
 This RN went to start the potassium and found the patients right arm significantly swollen. This RN checked the IV's in place and found the 20g in the RFA infiltrated, the 22g in the right hand would not flush and the catheter was hanging out, and then the 22g in the right wrist was occluded and would not flush. All IV's were removed and [place], RN called to ultrasound another line.

## 2024-05-14 NOTE — H&P (Signed)
 History and Physical    Gabriela White FMW:[id number] DOB: 02-19-1981 DOA: 05/13/2024  Referring MD/NP/PA:   PCP: [facility]   Patient coming from:  The patient is coming from home.     Chief Complaint: Nausea, vomiting, diarrhea, and fall.  HPI: Gabriela White is a 43 y.o. female with medical history significant of seizure, hypertension, GERD, tobacco abuse, alcohol abuse, anemia, GI bleeding, thrombocytopenia, hyponatremia, who presents with nausea, vomiting, diarrhea, and fall.  Pt states that she has had multiple episodes of nonbilious nonbloody vomiting in the past 24 hours, with multiple episodes of watery diarrhea.  Denies abdominal pain.  No fever or chills.  Patient does not have chest pain, cough, SOB.  She states that she fell this morning, possibly injured her head per patient.  No LOC.  No urinary numbness or tingling extremities.  Denies symptoms of UTI.  Patient is lethargic but easily arousable, orientated x 3.  She is not sure if she had seizure or not.  No sick contacts at home.  Data reviewed independently and ED Course: pt was found to have > WBC 14.1, TSH 1.526, potassium 3.1, sodium 122, GFR> 60, lactic acid 1.5, temperature 95.8, blood pressure 104/59, heart rate 50-60s, RR 18, oxygen saturation 100% on room air.  Patient is placed in telemetry bed for observation.   EKG: I have personally reviewed.  Sinus rhythm, QTc 303, early R wave progression, low voltage.   Review of Systems:   General: no fevers, chills, no body weight gain, has poor appetite, has fatigue HEENT: no blurry vision, hearing changes or sore throat Respiratory: no dyspnea, coughing, wheezing CV: no chest pain, no palpitations GI: has nausea, vomiting,  diarrhea, no constipation, abdominal pain, GU: no dysuria, burning on urination, increased urinary frequency, hematuria  Ext: no leg edema Neuro: no unilateral weakness, numbness, or tingling, no vision change or  hearing loss. Has fall. Skin: no rash, no skin tear. MSK: No muscle spasm, no deformity, no limitation of range of movement in spin Heme: No easy bruising.  Travel history: No recent long distant travel.   Allergy:  Allergies  Allergen Reactions   Influenza Virus Vaccine Hives   Morphine Nausea And Vomiting    Past Medical History:  Diagnosis Date   Acute blood loss anemia 09/16/2021   Hypertension    Seizures (HCC)     Past Surgical History:  Procedure Laterality Date   COLONOSCOPY WITH PROPOFOL  N/A 09/19/2021   Procedure: COLONOSCOPY WITH PROPOFOL ;  Surgeon: Unk Corinn Skiff, MD;  Location: [facility] ENDOSCOPY;  Service: Gastroenterology;  Laterality: N/A;   ESOPHAGOGASTRODUODENOSCOPY N/A 09/19/2021   Procedure: ESOPHAGOGASTRODUODENOSCOPY (EGD);  Surgeon: Unk Corinn Skiff, MD;  Location: [facility] ENDOSCOPY;  Service: Gastroenterology;  Laterality: N/A;   TONSILLECTOMY     TUBAL LIGATION      Social History:  reports that she has been smoking cigarettes. She has a 10 pack-year smoking history. She has never used smokeless tobacco. She reports current alcohol use of about 6.0 - 9.0 standard drinks of alcohol per week. She reports that she does not use drugs.  Family History:  Family History  Problem Relation Age of Onset   Diabetes Maternal Grandmother    Heart disease Maternal Grandmother    Heart attack Maternal Grandmother    Heart disease Maternal Grandfather      Prior to Admission medications   Medication Sig Start Date End Date Taking? Authorizing Provider  acetaminophen  (TYLENOL ) 500 MG tablet Take 500-1,000 mg by  mouth every 6 (six) hours as needed for mild pain or moderate pain.    [provider]  carvedilol (COREG) 3.125 MG tablet Take 3.125 mg by mouth 2 (two) times daily with a meal. 04/24/23   [provider]  cloBAZam  (ONFI ) 10 MG tablet Take 15 mg by mouth 2 (two) times daily. 02/03/23 03/27/24  [provider]  divalproex  (DEPAKOTE )  500 MG DR tablet Take 1,000 mg by mouth 2 (two) times daily.    [provider]  lacosamide  (VIMPAT ) 200 MG TABS tablet Take 200 mg by mouth 2 (two) times daily.    [provider]  NAYZILAM  5 MG/0.1ML SOLN Place 5 mg into both nostrils as directed.    [provider]    Physical Exam: Vitals:   05/14/24 0000 05/14/24 0032 05/14/24 0046 05/14/24 0129  BP: 104/73   111/79  Pulse: 66  71 74  Resp: 18  17 16   Temp:  (!) 95.9 F (35.5 C)  98.1 F (36.7 C)  TempSrc:  Oral    SpO2: 98%  97% 100%   General: Not in acute distress HEENT:       Eyes: PERRL, EOMI, no jaundice       ENT: No discharge from the ears and nose, no pharynx injection, no tonsillar enlargement.        Neck: No JVD, no bruit, no mass felt. Heme: No neck lymph node enlargement. Cardiac: S1/S2, RRR, No murmurs, No gallops or rubs. Respiratory: No rales, wheezing, rhonchi or rubs. GI: Soft, nondistended, nontender, no rebound pain, no organomegaly, BS present. GU: No hematuria Ext: No pitting leg edema bilaterally. 1+DP/PT pulse bilaterally. Musculoskeletal: No joint deformities, No joint redness or warmth, no limitation of ROM in spin. Skin: No rashes.  Neuro: Lethargic, easily arousable, oriented X3, cranial nerves II-XII grossly intact, moves all extremities normally.  Psych: Patient is not psychotic, no suicidal or hemocidal ideation.  Labs on Admission: I have personally reviewed following labs and imaging studies  CBC: Recent Labs  Lab 05/13/24 2155  WBC 14.1*  NEUTROABS 11.0*  HGB 12.8  HCT 36.1  MCV 93.0  PLT PLATELET CLUMPS NOTED ON SMEAR, UNABLE TO ESTIMATE   Basic Metabolic Panel: Recent Labs  Lab 05/13/24 2155  NA 122*  K 3.1*  CL 90*  CO2 20*  GLUCOSE 92  BUN 22*  CREATININE 0.85  CALCIUM  8.0*   GFR: CrCl cannot be calculated (Unknown ideal weight.). Liver Function Tests: Recent Labs  Lab 05/13/24 2155  AST 37  ALT 25  ALKPHOS 62  BILITOT 0.6  PROT  6.2*  ALBUMIN 2.9*   No results for input(s): LIPASE, AMYLASE in the last 168 hours. No results for input(s): AMMONIA in the last 168 hours. Coagulation Profile: No results for input(s): INR, PROTIME in the last 168 hours. Cardiac Enzymes: No results for input(s): CKTOTAL, CKMB, CKMBINDEX, TROPONINI in the last 168 hours. BNP (last 3 results) No results for input(s): PROBNP in the last 8760 hours. HbA1C: No results for input(s): HGBA1C in the last 72 hours. CBG: No results for input(s): GLUCAP in the last 168 hours. Lipid Profile: No results for input(s): CHOL, HDL, LDLCALC, TRIG, CHOLHDL, LDLDIRECT in the last 72 hours. Thyroid Function Tests: Recent Labs    05/13/24 2155  TSH 1.526   Anemia Panel: No results for input(s): VITAMINB12, FOLATE, FERRITIN, TIBC, IRON, RETICCTPCT in the last 72 hours. Urine analysis:    Component Value Date/Time   COLORURINE STRAW (A) 03/27/2024  1240   APPEARANCEUR CLEAR (A) 03/27/2024 1240   LABSPEC 1.004 (L) 03/27/2024 1240   PHURINE 7.0 03/27/2024 1240   GLUCOSEU NEGATIVE 03/27/2024 1240   HGBUR NEGATIVE 03/27/2024 1240   BILIRUBINUR NEGATIVE 03/27/2024 1240   KETONESUR 5 (A) 03/27/2024 1240   PROTEINUR NEGATIVE 03/27/2024 1240   NITRITE NEGATIVE 03/27/2024 1240   LEUKOCYTESUR NEGATIVE 03/27/2024 1240   Sepsis Labs: @LABRCNTIP (procalcitonin:4,lacticidven:4) )No results found for this or any previous visit (from the past 240 hours).   Radiological Exams on Admission:   Assessment/Plan Principal Problem:   Gastroenteritis Active Problems:   Hyponatremia   Hypokalemia   Seizure (HCC)   Fall at home, initial encounter   Essential hypertension   Chronic systolic CHF (congestive heart failure) (HCC)   Thrombocytopenia   Leukocytosis   Hypothermia   Tobacco use disorder   Alcohol abuse   Assessment and Plan:  Gastroenteritis: Etiology is not clear. - Place in telemetry bed of  observation - Check C. difficile and GI pathogen panel - Follow-up blood culture - IV fluid: 1 L normal saline, Naima 75 cc/h  Hyponatremia: Sodium 122, due to GI losses. - Will check urine sodium, urine osmolality, serum osmolality. - Fluid restriction - IVF: 1L NS in ED, will continue with IV normal saline at 75 mL/h - Sodium chloride  tablet 1 g twice daily - f/u by BMP q8h - avoid over correction too fast due to risk of central pontine myelinolysis  Hypokalemia: K 3.1 - Repleted potassium -Check magnesium  level - Check phosphorus level  Seizure (HCC):  - Seizure precaution - As needed Ativan  for seizure - Switch Depakote  to IV Depacon  1 g twice daily - Continue home Onfi  15 mg twice daily, Vimpat  200 mg twice daily  Fall at home, initial encounter -Fall precaution - Frequent neurocheck - Follow-up CT of head  Essential hypertension -IV hydralazine as needed - Hold Coreg since blood pressure is soft  Chronic systolic CHF (congestive heart failure) (HCC): 2D echo on 04/07/2023 showed EF 35-40%.  Patient does not have leg edema or JVD.  No SOB.  CHF seem to be compensated. -Check BNP  Hx of Thrombocytopenia: - Pending platelet  Leukocytosis: WBC 14.1 -Follow-up blood culture - UA  Hypothermia: Body temperature 95.8 -Bair hugger  Tobacco abuse and Alcohol abuse: -Did counseling about importance of quitting smoking and alcohol use -Nicotine  patch -CIWA protocol       DVT ppx: SCD  Code Status: Full code   Family Communication:     not done, no family member is at bed side.    Disposition Plan:  Anticipate discharge back to previous environment  Consults called:  none  Admission status and Level of care: Telemetry Medical:    for obs      Dispo: The patient is from: Home              Anticipated d/c is to: Home              Anticipated d/c date is: 1 day              Patient currently is not medically stable to d/c.    Severity of  Illness:  The appropriate patient status for this patient is OBSERVATION. Observation status is judged to be reasonable and necessary in order to provide the required intensity of service to ensure the patient's safety. The patient's presenting symptoms, physical exam findings, and initial radiographic and laboratory data in the context of their medical condition is  felt to place them at decreased risk for further clinical deterioration. Furthermore, it is anticipated that the patient will be medically stable for discharge from the hospital within 2 midnights of admission.        Date of Service 05/14/2024    Caleb Exon [facility]   If 7PM-7AM, please contact night-coverage [email or web address] 05/14/2024, 1:35 AM

## 2024-05-14 NOTE — Progress Notes (Signed)
  Brief Progress Note (See full H&P from earlier today)   Subjective: Pt reports nausea essentially has resolved, has had no bowel movements here, no headache no seizure no vision changes, reports typically she does not eat very much and has trouble adding salt to foods, just doesn't want to eat anything    Objective: Relevant new results: see urine/serum sodium wu as below, TSH normal recently, sodium dropping  Physical Exam:  BP 111/71 (BP Location: Left Arm)   Pulse 98   Temp 98.5 F (36.9 C)   Resp 16   Ht 5' 2 (1.575 m)   Wt 62.3 kg   LMP 09/25/2020 (Within Days)   SpO2 99%   BMI 25.11 kg/m  Constitutional:  General Appearance: alert, well-developed, well-nourished, NAD Respiratory: Normal respiratory effort Breath sounds normal, no wheeze/rhonchi/rales Cardiovascular: S1/S2 normal, no murmur/rub/gallop auscultated No lower extremity edema Gastrointestinal: Nontender, no masses Musculoskeletal:  No clubbing/cyanosis of digits Neurological: No cranial nerve deficit on limited exam Psychiatric: Normal judgment/insight Normal mood and affect   Assessment/Plan changes or updates compared to H&P:    Acute Hyponatremia Complicated by Chronic Hyponatremia  Urine studies during previous hospitalization with sodium less than 40 and urine osm of approximately 58, consistent with 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 Multiple potential contributing factors: low intake essentially tea/toast type diet, neuro/psych medications, likely GI losses w/ diarrhea, recent N/V question if GI problem caused sodium loss or if low sodium caused N/V symptoms,  Current labs:  Serum Na: 120-122 Serum Osm: 250 (low) Urine Na: <30 Urine Osm: 352 Other: TSH WNL.  sodium down to 117 --> Strict 1200 mL fluid restriction --> Adding Ensure Will need w/u glucocorticoid deficiency --> AM cortisol Question if need renal wasting workup, urine  sodium is low but her intake is so low that may not be much to waste --> nephrology consult  --> Continue frequent sodium checks  Given chronicity, do not think 3% saline is warranted --> confirm w/ nephrology   UTI --> await culture  --> ceftriaxone dose x1  Hypothermia - resolved --> monitor vitals   Cannabinoid (+)UDS ?hyperemesis syndrome but nausea is not a chronic issue for her  --> avoid use recreational substances    Patient with seizure activity outside of alcohol use 06/27/2022.  Witnessed by husband who described generalized rhythmic shaking of all limbs when she is asleep. Patient follows with [facility] neurology and NSG. Currently prescribed Depakote , Vimpat , and Onfi . Neurology to manage seizure medications. S/p vagal nerve stimulator implanted 08/2023, improved seizures.  Home meds restarted all po  --> Depakote  1000 mg BID --> Vimpat  200 mg BID --> Onfi  20 mg BID

## 2024-05-14 NOTE — Plan of Care (Signed)

## 2024-05-15 DIAGNOSIS — K529 Noninfective gastroenteritis and colitis, unspecified: Secondary | ICD-10-CM | POA: Diagnosis not present

## 2024-05-15 LAB — BASIC METABOLIC PANEL WITH GFR
Anion gap: 7 (ref 5–15)
Anion gap: 10 (ref 5–15)
BUN: 13 mg/dL (ref 6–20)
BUN: 13 mg/dL (ref 6–20)
CO2: 22 mmol/L (ref 22–32)
CO2: 20 mmol/L — ABNORMAL LOW (ref 22–32)
Calcium: 7.8 mg/dL — ABNORMAL LOW (ref 8.9–10.3)
Calcium: 8.1 mg/dL — ABNORMAL LOW (ref 8.9–10.3)
Chloride: 95 mmol/L — ABNORMAL LOW (ref 98–111)
Chloride: 92 mmol/L — ABNORMAL LOW (ref 98–111)
Creatinine, Ser: 0.69 mg/dL (ref 0.44–1.00)
Creatinine, Ser: 0.69 mg/dL (ref 0.44–1.00)
GFR, Estimated: >60 mL/min (ref >60)
GFR, Estimated: >60 mL/min (ref >60)
Glucose, Bld: 95 mg/dL (ref 70–99)
Glucose, Bld: 106 mg/dL — ABNORMAL HIGH (ref 70–99)
Potassium: 3.1 mmol/L — ABNORMAL LOW (ref 3.5–5.1)
Potassium: 3.2 mmol/L — ABNORMAL LOW (ref 3.5–5.1)
Sodium: 124 mmol/L — ABNORMAL LOW (ref 135–145)
Sodium: 122 mmol/L — ABNORMAL LOW (ref 135–145)

## 2024-05-15 LAB — CBC
HCT: 31.2 % — ABNORMAL LOW (ref 36.0–46.0)
Hemoglobin: 11.6 g/dL — ABNORMAL LOW (ref 12.0–15.0)
MCH: 32.8 pg (ref 26.0–34.0)
MCHC: 37.2 g/dL — ABNORMAL HIGH (ref 30.0–36.0)
MCV: 88.1 fL (ref 80.0–100.0)
Platelets: 96 K/uL — ABNORMAL LOW (ref 150–400)
RBC: 3.54 MIL/uL — ABNORMAL LOW (ref 3.87–5.11)
RDW: 14.2 % (ref 11.5–15.5)
WBC: 24 K/uL — ABNORMAL HIGH (ref 4.0–10.5)
nRBC: 0 % (ref 0.0–0.2)

## 2024-05-15 LAB — CORTISOL-AM, BLOOD: Cortisol - AM: 25.9 ug/dL — ABNORMAL HIGH (ref 6.7–22.6)

## 2024-05-15 LAB — MAGNESIUM: Magnesium: 2.5 mg/dL — ABNORMAL HIGH (ref 1.7–2.4)

## 2024-05-15 LAB — SODIUM: Sodium: 124 mmol/L — ABNORMAL LOW (ref 135–145)

## 2024-05-15 MED ORDER — POTASSIUM CHLORIDE CRYS ER 20 MEQ PO TBCR
40.0000 meq | EXTENDED_RELEASE_TABLET | Freq: Once | ORAL | Status: AC
  Administered 2024-05-15: 40 meq via ORAL
  Filled 2024-05-15: qty 2

## 2024-05-15 NOTE — Plan of Care (Signed)

## 2024-05-15 NOTE — Consult Note (Signed)
 [facility]  CONSULT NOTE    Date: 05/15/2024                  Patient Name:  Gabriela White  MRN: [id number]  DOB: Dec 22, 1980  Age / Sex: 43 y.o., female         PCP: [facility]                 Service Requesting Consult: TRH                 Reason for Consult: Hyponatremia            History of Present Illness: Gabriela White is a 43 y.o.  female with past medical conditions including GERD, anemia, seizures, thrombocytopenia, tobacco and alcohol abuse, hypertension, and chronic hyponatremia, who was admitted to [facility] on 05/13/2024 for Hypokalemia [E87.6] Gastroenteritis [K52.9] Hyponatremia [E87.1] Hypothermia, initial encounter [T68.XXXA] Nausea and vomiting, unspecified vomiting type [R11.2]  Patient presents to the emergency department with complaints of nausea, vomiting, diarrhea, and recent fall.  Patient seen resting in bed today, husband at bedside.  Currently denies GI symptoms today.  According to husband and patient, tolerating small meals as she does outpatient.  Very picky, light eater.  Does report sweating and irritability prior to admission.  Husband states he drinks about 1-2 beers daily.  Had not had a beer for couple days prior to ED arrival.  Labs on ED arrival consisted of sodium 122, potassium 3.1, serum bicarb 20, BNP 107, and white count 14.1.  UA is appears cloudy with leukocytes and bacteria.  UDS positive for benzodiazepine and cannabis.  Urine culture positive for E. coli.  CT head negative for acute findings.  Sodium levels decreased to 117, now 124 today.  Medications: Outpatient medications: Medications Prior to Admission  Medication Sig Dispense Refill Last Dose/Taking   acetaminophen  (TYLENOL ) 500 MG tablet Take 1,000 mg by mouth 2 (two) times daily.   Taking   carvedilol (COREG) 3.125 MG tablet Take 3.125 mg by mouth 2 (two) times daily with a meal.   Taking   cloBAZam  (ONFI ) 10 MG tablet Take  15 mg by mouth 2 (two) times daily.   Taking   divalproex  (DEPAKOTE ) 500 MG DR tablet Take 1,000 mg by mouth 2 (two) times daily.   Taking   ibuprofen (ADVIL) 200 MG tablet Take 200 mg by mouth 2 (two) times daily.   Taking   lacosamide  (VIMPAT ) 200 MG TABS tablet Take 200 mg by mouth 2 (two) times daily.   Taking   Multiple Vitamin (MULTIVITAMIN WITH MINERALS) TABS tablet Take 1 tablet by mouth daily.   Taking   NAYZILAM  5 MG/0.1ML SOLN Place 5 mg into both nostrils as directed. Give 1 spray (5mg ) into 1 nostril. If no response in 10 min, may give second spray in other nostril. Do not give second dose if patient has trouble breathing or excessive sedation. Max 2 doses/seizure episode, 1 episode every 3 days or 5 episodes/month   Taking    Current medications: Current Facility-Administered Medications  Medication Dose Route Frequency Provider Last Rate Last Admin   acetaminophen  (TYLENOL ) tablet 650 mg  650 mg Oral Q6H PRN Niu, Xilin, MD       cefTRIAXone (ROCEPHIN) 1 g in sodium chloride  0.9 % 100 mL IVPB  1 g Intravenous Q24H Alexander, Natalie, DO 200 mL/hr at 05/14/24 1455 1 g at 05/14/24 1455   cloBAZam  (ONFI ) tablet 15 mg  15 mg Oral BID Niu, Xilin, MD   15 mg at 05/15/24 0920   divalproex  (DEPAKOTE ) DR tablet 1,000 mg  1,000 mg Oral BID Alexander, Natalie, DO   1,000 mg at 05/15/24 1024   feeding supplement (ENSURE PLUS HIGH PROTEIN) liquid 237 mL  237 mL Oral TID BM Alexander, Natalie, DO   237 mL at 05/14/24 1630   folic acid  (FOLVITE ) tablet 1 mg  1 mg Oral Daily Niu, Xilin, MD   1 mg at 05/15/24 9080   hydrALAZINE (APRESOLINE) injection 5 mg  5 mg Intravenous Q2H PRN Niu, Xilin, MD       lacosamide  (VIMPAT ) tablet 200 mg  200 mg Oral BID Niu, Xilin, MD   200 mg at 05/15/24 9080   LORazepam  (ATIVAN ) injection 0-4 mg  0-4 mg Intravenous Q6H Niu, Xilin, MD       Followed by   NOREEN ON 05/16/2024] LORazepam  (ATIVAN ) injection 0-4 mg  0-4 mg Intravenous Q12H Niu, Xilin, MD       LORazepam   (ATIVAN ) tablet 1-4 mg  1-4 mg Oral Q1H PRN Niu, Xilin, MD       Or   LORazepam  (ATIVAN ) injection 1-4 mg  1-4 mg Intravenous Q1H PRN Niu, Xilin, MD       LORazepam  (ATIVAN ) injection 2 mg  2 mg Intravenous Q2H PRN Niu, Xilin, MD       multivitamin with minerals tablet 1 tablet  1 tablet Oral Daily Niu, Xilin, MD   1 tablet at 05/15/24 9080   nicotine  (NICODERM CQ  - dosed in mg/24 hours) patch 21 mg  21 mg Transdermal Daily Niu, Xilin, MD   21 mg at 05/14/24 1213   ondansetron  (ZOFRAN ) injection 4 mg  4 mg Intravenous Q8H PRN Niu, Xilin, MD       sodium chloride  tablet 1 g  1 g Oral BID WC Niu, Xilin, MD   1 g at 05/15/24 0815   thiamine  (VITAMIN B1) tablet 100 mg  100 mg Oral Daily Niu, Xilin, MD   100 mg at 05/15/24 9080   Or   thiamine  (VITAMIN B1) injection 100 mg  100 mg Intravenous Daily Niu, Xilin, MD          Allergies: Allergies  Allergen Reactions   Influenza Virus Vaccine Hives   Morphine Nausea And Vomiting      Past Medical History: Past Medical History:  Diagnosis Date   Acute blood loss anemia 09/16/2021   Hypertension    Seizures (HCC)      Past Surgical History: Past Surgical History:  Procedure Laterality Date   COLONOSCOPY WITH PROPOFOL  N/A 09/19/2021   Procedure: COLONOSCOPY WITH PROPOFOL ;  Surgeon: Unk Corinn Skiff, MD;  Location: [facility] ENDOSCOPY;  Service: Gastroenterology;  Laterality: N/A;   ESOPHAGOGASTRODUODENOSCOPY N/A 09/19/2021   Procedure: ESOPHAGOGASTRODUODENOSCOPY (EGD);  Surgeon: Unk Corinn Skiff, MD;  Location: [facility] ENDOSCOPY;  Service: Gastroenterology;  Laterality: N/A;   TONSILLECTOMY     TUBAL LIGATION       Family History: Family History  Problem Relation Age of Onset   Diabetes Maternal Grandmother    Heart disease Maternal Grandmother    Heart attack Maternal Grandmother    Heart disease Maternal Grandfather      Social History: Social History   Socioeconomic History   Marital status: Married    Spouse name: Brad    Number of children: 2   Years of education: Not on file   Highest education level: Not on file  Occupational History  Not on file  Tobacco Use   Smoking status: Every Day    Current packs/day: 0.50    Average packs/day: 0.5 packs/day for 20.0 years (10.0 ttl pk-yrs)    Types: Cigarettes   Smokeless tobacco: Never  Vaping Use   Vaping status: Never Used  Substance and Sexual Activity   Alcohol use: Yes    Alcohol/week: 6.0 - 9.0 standard drinks of alcohol    Types: 6 - 9 Standard drinks or equivalent per week   Drug use: No   Sexual activity: Not Currently  Other Topics Concern   Not on file  Social History Narrative   Not on file   Social Drivers of Health   Financial Resource Strain: Medium Risk (09/10/2023)   Received from [facility]   Overall Financial Resource Strain (CARDIA)    Difficulty of Paying Living Expenses: Somewhat hard  Food Insecurity: No Food Insecurity (05/14/2024)   Hunger Vital Sign    Worried About Running Out of Food in the Last Year: Never true    Ran Out of Food in the Last Year: Never true  Transportation Needs: No Transportation Needs (05/14/2024)   PRAPARE - Administrator, Civil Service (Medical): No    Lack of Transportation (Non-Medical): No  Physical Activity: Not on file  Stress: Not on file  Social Connections: Not on file  Intimate Partner Violence: Not At Risk (05/14/2024)   Humiliation, Afraid, Rape, and Kick questionnaire    Fear of Current or Ex-Partner: No    Emotionally Abused: No    Physically Abused: No    Sexually Abused: No     Review of Systems: Review of Systems  Constitutional:  Negative for chills, fever and malaise/fatigue.  HENT:  Negative for congestion, sore throat and tinnitus.   Eyes:  Negative for blurred vision and redness.  Respiratory:  Negative for cough, shortness of breath and wheezing.   Cardiovascular:  Negative for chest pain, palpitations, claudication and leg swelling.   Gastrointestinal:  Positive for diarrhea, nausea and vomiting. Negative for abdominal pain and blood in stool.  Genitourinary:  Negative for flank pain, frequency and hematuria.  Musculoskeletal:  Positive for falls. Negative for back pain and myalgias.  Skin:  Negative for rash.  Neurological:  Negative for dizziness, weakness and headaches.  Endo/Heme/Allergies:  Does not bruise/bleed easily.  Psychiatric/Behavioral:  Negative for depression. The patient is not nervous/anxious and does not have insomnia.     Vital Signs: Blood pressure 116/72, pulse 95, temperature 98.9 F (37.2 C), temperature source Axillary, resp. rate 18, height 5' 2 (1.575 m), weight 61.8 kg, last menstrual period 09/25/2020, SpO2 97%.  Weight trends: Filed Weights   05/14/24 0129 05/15/24 0500  Weight: 62.3 kg 61.8 kg    Physical Exam: General: NAD  Head: Normocephalic, atraumatic. Moist oral mucosal membranes  Eyes: Anicteric  Lungs:  Clear to auscultation, room air  Heart: Regular rate and rhythm  Abdomen:  Soft, nontender,   Extremities: No peripheral edema.  Neurologic: Awake and alert  Skin: No lesions        Lab results: Basic Metabolic Panel: Recent Labs  Lab 05/14/24 0537 05/14/24 1156 05/14/24 1615 05/14/24 2225 05/15/24 0718  NA 120* 117* 119* 120* 124*  K 3.7 3.4*  --   --  3.1*  CL 93* 88*  --   --  95*  CO2 19* 19*  --   --  22  GLUCOSE 80 77  --   --  95  BUN 18 14  --   --  13  CREATININE 0.60 0.66  --   --  0.69  CALCIUM  7.6* 7.6*  --   --  7.8*  MG 2.2  --   --   --   --   PHOS 1.5*  --   --   --   --     Liver Function Tests: Recent Labs  Lab 05/13/24 2155  AST 37  ALT 25  ALKPHOS 62  BILITOT 0.6  PROT 6.2*  ALBUMIN 2.9*   No results for input(s): LIPASE, AMYLASE in the last 168 hours. No results for input(s): AMMONIA in the last 168 hours.  CBC: Recent Labs  Lab 05/13/24 2155 05/14/24 1156  WBC 14.1* 17.2*  NEUTROABS 11.0*  --   HGB 12.8  11.7*  HCT 36.1 31.5*  MCV 93.0 87.5  PLT PLATELET CLUMPS NOTED ON SMEAR, UNABLE TO ESTIMATE 104*    Cardiac Enzymes: No results for input(s): CKTOTAL, CKMB, CKMBINDEX, TROPONINI in the last 168 hours.  BNP: Invalid input(s): POCBNP  CBG: No results for input(s): GLUCAP in the last 168 hours.  Microbiology: Results for orders placed or performed during the hospital encounter of 05/13/24  Culture, blood (Routine X 2) w Reflex to ID Panel     Status: None (Preliminary result)   Collection Time: 05/13/24  9:55 PM   Specimen: BLOOD  Result Value Ref Range Status   Specimen Description BLOOD BLOOD RIGHT HAND  Final   Special Requests   Final    BOTTLES DRAWN AEROBIC ONLY Blood Culture results may not be optimal due to an inadequate volume of blood received in culture bottles   Culture   Final    NO GROWTH 1 DAY Performed at [facility], [address] [place], [place] [address]    Report Status PENDING  Incomplete  Culture, blood (Routine X 2) w Reflex to ID Panel     Status: None (Preliminary result)   Collection Time: 05/13/24  9:55 PM   Specimen: BLOOD  Result Value Ref Range Status   Specimen Description BLOOD BLOOD RIGHT WRIST  Final   Special Requests   Final    BOTTLES DRAWN AEROBIC AND ANAEROBIC Blood Culture results may not be optimal due to an inadequate volume of blood received in culture bottles   Culture   Final    NO GROWTH 1 DAY Performed at [facility], [address], [place], [place] [address]    Report Status PENDING  Incomplete  Urine Culture     Status: Abnormal (Preliminary result)   Collection Time: 05/14/24  1:53 AM   Specimen: Urine, Random  Result Value Ref Range Status   Specimen Description   Final    URINE, RANDOM Performed at [facility], [address], [place], [place] [address]    Special Requests   Final    NONE Reflexed from [id number] Performed at [facility], [address]  [place], [place] [address]    Culture (A)  Final    >=100,000 COLONIES/mL ESCHERICHIA COLI SUSCEPTIBILITIES TO FOLLOW Performed at [facility], [address], [place], [place] [address]    Report Status PENDING  Incomplete    Coagulation Studies: No results for input(s): LABPROT, INR in the last 72 hours.  Urinalysis: Recent Labs    05/14/24 0153  COLORURINE YELLOW*  LABSPEC 1.011  PHURINE 5.0  GLUCOSEU NEGATIVE  HGBUR SMALL*  BILIRUBINUR NEGATIVE  THEADORE  5*  PROTEINUR 30*  NITRITE NEGATIVE  LEUKOCYTESUR LARGE*      Imaging: CT HEAD WO CONTRAST ( ) Result Date: 05/14/2024 EXAM: CT HEAD WITHOUT CONTRAST 05/14/2024 02:10:36 AM TECHNIQUE: CT of the head was performed without the administration of intravenous contrast. Automated exposure control, iterative reconstruction, and/or weight based adjustment of the mA/kV was utilized to reduce the radiation dose to as low as reasonably achievable. COMPARISON: 02/11/2023 CLINICAL HISTORY: Head trauma, moderate-severe. Nausea, vomiting, and diarrhea for 24 hours. Multiple episodes where the patient seemed post-ictal, though no witnessed seizure. Chills (no known fevers) and generalized weakness. Decreased PO intake, 20 lbs weight loss in recent months. FINDINGS: BRAIN AND VENTRICLES: No acute hemorrhage, evidence of acute infarct, hydrocephalus, or extra-axial collection. No mass effect or midline shift. ORBITS: No acute abnormality. SINUSES: No acute abnormality. SOFT TISSUES AND SKULL: No acute soft tissue abnormality or skull fracture. IMPRESSION: 1. No acute intracranial abnormality Electronically signed by: Franky Stanford MD 05/14/2024 03:55 AM EDT RP Workstation: [id number]     Assessment & Plan: Ms. Rahmah Mccamy is a 43 y.o.  female with past medical conditions including GERD, anemia, seizures, thrombocytopenia, tobacco and alcohol abuse, hypertension, and chronic hyponatremia, who was admitted to [facility] on 05/13/2024  for Hypokalemia [E87.6] Gastroenteritis [K52.9] Hyponatremia [E87.1] Hypothermia, initial encounter [T68.XXXA] Nausea and vomiting, unspecified vomiting type [R11.2]  Acute on chronic hyponatremia.  Patient appears chronically low, baseline appears to be 129-132.  Admitted with as sodium 122, decreased to 117.  Likely secondary to beer [place]oto mania with decreased solute intake.  Presenting symptoms more concerning for alcohol withdrawal.  Continue fluid restriction and increase solute intake.  Patient encouraged to diversify meals.  Also encouraged to discontinue alcohol use.  Agree with rechecking sodium level this afternoon, if 127 or greater, patient cleared to discharge from renal stance    LOS: 1 Akasia Ahmad 10/5/20252:55 PM

## 2024-05-15 NOTE — Progress Notes (Signed)
 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 Multiple potential contributing factors: low intake essentially tea/toast type diet, neuro/psych medications, likely GI losses w/ diarrhea, recent N/V question if GI problem caused sodium loss or if low sodium caused N/V symptoms,  Current labs:  Serum Na: 120-122 Serum Osm: 250 (low) Urine Na: <30 Urine Osm: 352 Other: TSH WNL.  sodium down to 117 lowest Today up to 124 --> Strict 1200 mL fluid restriction --> Adding Ensure --> nephrology ok to dc if/when sodium is 127 or higher, recheck this afternoon 124 and pt still notes tired/weak  --> Will need w/u glucocorticoid deficiency w/ AM cortisol --> Question if need renal wasting workup, urine sodium is low but her intake is so low that may not be much to waste - nephrology consult - no concerns for this at this time  --> Continue frequent sodium checks  --> Given chronicity, do not think 3% saline is warranted - confirmed w/ nephrology   Abnormal UA but no dysuria/frequency --> await culture - (+)Ecoli --> ceftriaxone dose x1 but if asymptomatic bacteriuria no need for treatment   Hypothermia - resolved --> monitor vitals   Cannabinoid (+)UDS ?hyperemesis syndrome but nausea is not a chronic issue for her  --> avoid use recreational substances    Patient with seizure activity outside of alcohol use 06/27/2022.  Witnessed by husband who described generalized rhythmic shaking of all limbs when she is asleep. Patient follows with [facility] neurology and NSG. Currently prescribed  Depakote , Vimpat , and Onfi . Neurology to manage seizure medications. S/p vagal nerve stimulator implanted 08/2023, improved seizures.  Home meds restarted all po  --> Depakote  1000 mg BID --> Vimpat  200 mg BID --> Onfi  20 mg BID               Subjective / Brief ROS:  Patient reports tired, still poor appetite but this is normal for her Husband is concerned about her generalized weakness Denies CP/SOB.  Pain controlled.  Denies new weakness or focal weakness, just generally feeling weak / tired  Tolerating diet but poor po intake .  Reports no concerns w/ urination/defecation - denies dysuria, hematuria, frequency in particular .   Family Communication: husband at bedside on rounds    Objective Findings:  Vitals:   05/15/24 0440 05/15/24 0500 05/15/24 0730 05/15/24 1400  BP: 102/66  104/70 116/72  Pulse: 89  90 95  Resp: 18  20 18   Temp:   99.2 F (37.3 C) 98.9 F (37.2 C)  TempSrc:   Oral Axillary  SpO2: 97%  96% 97%  Weight:  61.8 kg    Height:        Intake/Output Summary (Last 24 hours) at 05/15/2024 1615 Last data filed at 05/15/2024 1413 Gross per 24 hour  Intake 200 ml  Output --  Net 200 ml   Filed Weights   05/14/24 0129 05/15/24 0500  Weight: 62.3 kg 61.8 kg    Examination:  Physical Exam Constitutional:      General: She is not in acute distress. Cardiovascular:     Rate and Rhythm:  Normal rate and regular rhythm.  Pulmonary:     Effort: Pulmonary effort is normal.     Breath sounds: Normal breath sounds.  Abdominal:     Palpations: Abdomen is soft.  Neurological:     General: No focal deficit present.     Mental Status: She is alert and oriented to person, place, and time.  Psychiatric:        Mood and Affect: Mood normal.        Behavior: Behavior normal.          Scheduled Medications:   cloBAZam   15 mg Oral BID   divalproex   1,000 mg Oral BID   feeding supplement  237 mL Oral TID BM   folic acid   1 mg Oral Daily    lacosamide   200 mg Oral BID   LORazepam   0-4 mg Intravenous Q6H   Followed by   NOREEN ON 05/16/2024] LORazepam   0-4 mg Intravenous Q12H   multivitamin with minerals  1 tablet Oral Daily   nicotine   21 mg Transdermal Daily   potassium chloride   40 mEq Oral Once   sodium chloride   1 g Oral BID WC   thiamine   100 mg Oral Daily   Or   thiamine   100 mg Intravenous Daily    Continuous Infusions:  cefTRIAXone (ROCEPHIN)  IV 1 g (05/15/24 1456)    PRN Medications:  acetaminophen , hydrALAZINE, LORazepam  **OR** LORazepam , LORazepam , ondansetron  (ZOFRAN ) IV  Antimicrobials from admission:  Anti-infectives (From admission, onward)    Start     Dose/Rate Route Frequency Ordered Stop   05/14/24 1400  cefTRIAXone (ROCEPHIN) 1 g in sodium chloride  0.9 % 100 mL IVPB        1 g 200 mL/hr over 30 Minutes Intravenous Every 24 hours 05/14/24 1259             Data Reviewed:  I have personally reviewed the following...  CBC: Recent Labs  Lab 05/13/24 2155 05/14/24 1156  WBC 14.1* 17.2*  NEUTROABS 11.0*  --   HGB 12.8 11.7*  HCT 36.1 31.5*  MCV 93.0 87.5  PLT PLATELET CLUMPS NOTED ON SMEAR, UNABLE TO ESTIMATE 104*   Basic Metabolic Panel: Recent Labs  Lab 05/13/24 2155 05/14/24 0537 05/14/24 1156 05/14/24 1615 05/14/24 2225 05/15/24 0718 05/15/24 1436  NA 122* 120* 117* 119* 120* 124* 124*  K 3.1* 3.7 3.4*  --   --  3.1*  --   CL 90* 93* 88*  --   --  95*  --   CO2 20* 19* 19*  --   --  22  --   GLUCOSE 92 80 77  --   --  95  --   BUN 22* 18 14  --   --  13  --   CREATININE 0.85 0.60 0.66  --   --  0.69  --   CALCIUM  8.0* 7.6* 7.6*  --   --  7.8*  --   MG  --  2.2  --   --   --   --   --   PHOS  --  1.5*  --   --   --   --   --    GFR: Estimated Creatinine Clearance: 79.3 mL/min (by C-G formula based on SCr of 0.69 mg/dL). Liver Function Tests: Recent Labs  Lab 05/13/24 2155  AST 37  ALT 25  ALKPHOS 62  BILITOT 0.6  PROT 6.2*  ALBUMIN 2.9*   No results for  input(s): LIPASE, AMYLASE in the last 168 hours. No results for input(s): AMMONIA in the last 168 hours. Coagulation Profile: No results for input(s): INR, PROTIME in the last 168 hours. Cardiac Enzymes: No results for input(s): CKTOTAL, CKMB, CKMBINDEX, TROPONINI in the last 168 hours. BNP (last 3 results) No results for input(s): PROBNP in the last 8760 hours. HbA1C: No results for input(s): HGBA1C in the last 72 hours. CBG: No results for input(s): GLUCAP in the last 168 hours. Lipid Profile: No results for input(s): CHOL, HDL, LDLCALC, TRIG, CHOLHDL, LDLDIRECT in the last 72 hours. Thyroid Function Tests: Recent Labs    05/13/24 2155  TSH 1.526   Anemia Panel: No results for input(s): VITAMINB12, FOLATE, FERRITIN, TIBC, IRON, RETICCTPCT in the last 72 hours. Most Recent Urinalysis On File:     Component Value Date/Time   COLORURINE YELLOW (A) 05/14/2024 0153   APPEARANCEUR CLOUDY (A) 05/14/2024 0153   LABSPEC 1.011 05/14/2024 0153   PHURINE 5.0 05/14/2024 0153   GLUCOSEU NEGATIVE 05/14/2024 0153   HGBUR SMALL (A) 05/14/2024 0153   BILIRUBINUR NEGATIVE 05/14/2024 0153   KETONESUR 5 (A) 05/14/2024 0153   PROTEINUR 30 (A) 05/14/2024 0153   NITRITE NEGATIVE 05/14/2024 0153   LEUKOCYTESUR LARGE (A) 05/14/2024 0153   Sepsis Labs: @LABRCNTIP (procalcitonin:4,lacticidven:4) Microbiology: Recent Results (from the past 240 hours)  Culture, blood (Routine X 2) w Reflex to ID Panel     Status: None (Preliminary result)   Collection Time: 05/13/24  9:55 PM   Specimen: BLOOD  Result Value Ref Range Status   Specimen Description BLOOD BLOOD RIGHT HAND  Final   Special Requests   Final    BOTTLES DRAWN AEROBIC ONLY Blood Culture results may not be optimal due to an inadequate volume of blood received in culture bottles   Culture   Final    NO GROWTH 1 DAY Performed at [facility], [address] [place], [place]  [address]    Report Status PENDING  Incomplete  Culture, blood (Routine X 2) w Reflex to ID Panel     Status: None (Preliminary result)   Collection Time: 05/13/24  9:55 PM   Specimen: BLOOD  Result Value Ref Range Status   Specimen Description BLOOD BLOOD RIGHT WRIST  Final   Special Requests   Final    BOTTLES DRAWN AEROBIC AND ANAEROBIC Blood Culture results may not be optimal due to an inadequate volume of blood received in culture bottles   Culture   Final    NO GROWTH 1 DAY Performed at [facility], [address] [place], [place] [address]    Report Status PENDING  Incomplete  Urine Culture     Status: Abnormal (Preliminary result)   Collection Time: 05/14/24  1:53 AM   Specimen: Urine, Random  Result Value Ref Range Status   Specimen Description   Final    URINE, RANDOM Performed at [facility], [address] [place], [place] [address]    Special Requests   Final    NONE Reflexed from [id number] Performed at [facility], [address] [place], [place] [address]    Culture (A)  Final    >=100,000 COLONIES/mL ESCHERICHIA COLI SUSCEPTIBILITIES TO FOLLOW Performed at [facility], [address], [place], [place] [address]    Report Status PENDING  Incomplete      Radiology Studies last 3 days: CT HEAD WO CONTRAST ( ) Result Date: 05/14/2024 EXAM: CT HEAD WITHOUT CONTRAST 05/14/2024 02:10:36 AM TECHNIQUE:  CT of the head was performed without the administration of intravenous contrast. Automated exposure control, iterative reconstruction, and/or weight based adjustment of the mA/kV was utilized to reduce the radiation dose to as low as reasonably achievable. COMPARISON: 02/11/2023 CLINICAL HISTORY: Head trauma, moderate-severe. Nausea, vomiting, and diarrhea for 24 hours. Multiple episodes where the patient seemed post-ictal, though no witnessed seizure. Chills (no known fevers) and generalized weakness. Decreased PO intake, 20 lbs weight  loss in recent months. FINDINGS: BRAIN AND VENTRICLES: No acute hemorrhage, evidence of acute infarct, hydrocephalus, or extra-axial collection. No mass effect or midline shift. ORBITS: No acute abnormality. SINUSES: No acute abnormality. SOFT TISSUES AND SKULL: No acute soft tissue abnormality or skull fracture. IMPRESSION: 1. No acute intracranial abnormality Electronically signed by: Franky Stanford MD 05/14/2024 03:55 AM EDT RP Workstation: [id number]         Laneta Blunt, DO [facility] 05/15/2024, 4:15 PM    Dictation software may have been used to generate the above note. Typos may occur and escape review in typed/dictated notes. Please contact Dr Blunt directly for clarity if needed.  Staff may message me via secure chat in Epic  but this may not receive an immediate response,  please page me for urgent matters!  If 7PM-7AM, please contact night coverage [email or web address]

## 2024-05-16 ENCOUNTER — Inpatient Hospital Stay

## 2024-05-16 DIAGNOSIS — K529 Noninfective gastroenteritis and colitis, unspecified: Secondary | ICD-10-CM | POA: Diagnosis not present

## 2024-05-16 LAB — CBC WITH DIFFERENTIAL/PLATELET
Abs Immature Granulocytes: 0.47 K/uL — ABNORMAL HIGH (ref 0.00–0.07)
Basophils Absolute: 0.1 K/uL (ref 0.0–0.1)
Basophils Relative: 1 %
Eosinophils Absolute: 0 K/uL (ref 0.0–0.5)
Eosinophils Relative: 0 %
HCT: 33.7 % — ABNORMAL LOW (ref 36.0–46.0)
Hemoglobin: 12.5 g/dL (ref 12.0–15.0)
Immature Granulocytes: 2 %
Lymphocytes Relative: 3 %
Lymphs Abs: 0.7 K/uL (ref 0.7–4.0)
MCH: 32.8 pg (ref 26.0–34.0)
MCHC: 37.1 g/dL — ABNORMAL HIGH (ref 30.0–36.0)
MCV: 88.5 fL (ref 80.0–100.0)
Monocytes Absolute: 2.7 K/uL — ABNORMAL HIGH (ref 0.1–1.0)
Monocytes Relative: 12 %
Neutro Abs: 19.5 K/uL — ABNORMAL HIGH (ref 1.7–7.7)
Neutrophils Relative %: 82 %
Platelets: 107 K/uL — ABNORMAL LOW (ref 150–400)
RBC: 3.81 MIL/uL — ABNORMAL LOW (ref 3.87–5.11)
RDW: 14.5 % (ref 11.5–15.5)
WBC: 23.6 K/uL — ABNORMAL HIGH (ref 4.0–10.5)
nRBC: 0 % (ref 0.0–0.2)

## 2024-05-16 LAB — BASIC METABOLIC PANEL WITH GFR
Anion gap: 7 (ref 5–15)
BUN: 14 mg/dL (ref 6–20)
CO2: 21 mmol/L — ABNORMAL LOW (ref 22–32)
Calcium: 8 mg/dL — ABNORMAL LOW (ref 8.9–10.3)
Chloride: 95 mmol/L — ABNORMAL LOW (ref 98–111)
Creatinine, Ser: 0.64 mg/dL (ref 0.44–1.00)
GFR, Estimated: >60 mL/min (ref >60)
Glucose, Bld: 91 mg/dL (ref 70–99)
Potassium: 3.4 mmol/L — ABNORMAL LOW (ref 3.5–5.1)
Sodium: 123 mmol/L — ABNORMAL LOW (ref 135–145)

## 2024-05-16 LAB — RESPIRATORY PANEL BY PCR

## 2024-05-16 LAB — RESP PANEL BY RT-PCR (RSV, FLU A&B, COVID)  RVPGX2
Influenza A by PCR: NEGATIVE
Influenza B by PCR: NEGATIVE
Resp Syncytial Virus by PCR: NEGATIVE
SARS Coronavirus 2 by RT PCR: NEGATIVE

## 2024-05-16 LAB — CBC
Hemoglobin: 11.3 g/dL — ABNORMAL LOW (ref 12.0–15.0)
Platelets: 95 K/uL — ABNORMAL LOW (ref 150–400)
WBC: 20.4 K/uL — ABNORMAL HIGH (ref 4.0–10.5)

## 2024-05-16 LAB — URINE CULTURE: Culture: >=100000 — AB

## 2024-05-16 LAB — SODIUM: Sodium: 124 mmol/L — ABNORMAL LOW (ref 135–145)

## 2024-05-16 MED ORDER — ENOXAPARIN SODIUM 40 MG/0.4ML IJ SOSY
40.0000 mg | PREFILLED_SYRINGE | INTRAMUSCULAR | Status: DC
  Administered 2024-05-16 – 2024-05-18 (×3): 40 mg via SUBCUTANEOUS
  Filled 2024-05-16 (×3): qty 0.4

## 2024-05-16 NOTE — Evaluation (Signed)
 Occupational Therapy Evaluation Patient Details Name: Saleena Tamas MRN: [id number] DOB: July 28, 1981 Today's Date: 05/16/2024   History of Present Illness   43 y.o. female with medical history significant of seizure, hypertension, GERD, tobacco abuse, alcohol abuse, anemia, GI bleeding, thrombocytopenia, hyponatremia, who presents with nausea, vomiting, diarrhea, and fall.     Clinical Impressions Patient presenting with decreased Ind in self care, balance, functional mobility/transfers, endurance, and safety awareness. Patient's mother present to confirm baseline. Pt with cognitive deficits and confusion in regards to explaining PLOF and home set up. Pt reports sleeping on couch downstairs and family assists her upstairs with shower. She is never left alone per mother in room. Pt needing min A for mobility in room and having difficulty with problem solving and sequencing at sink to brush teeth and then for toileting in bathroom. Pt returning to bed at end of session with call bell and all needed items within reach upon exiting the room.Patient will benefit from acute OT to increase overall independence in the areas of ADLs, functional mobility,and safety awareness  in order to safely discharge .     If plan is discharge home, recommend the following:   A little help with walking and/or transfers;A little help with bathing/dressing/bathroom;Assistance with feeding;Assistance with cooking/housework;Direct supervision/assist for medications management;Direct supervision/assist for financial management;Assist for transportation;Help with stairs or ramp for entrance;Supervision due to cognitive status     Functional Status Assessment   Patient has had a recent decline in their functional status and demonstrates the ability to make significant improvements in function in a reasonable and predictable amount of time.     Equipment Recommendations   None recommended by OT       Precautions/Restrictions   Precautions Precautions: Fall     Mobility Bed Mobility Overal bed mobility: Needs Assistance Bed Mobility: Supine to Sit, Sit to Supine     Supine to sit: Supervision Sit to supine: Supervision        Transfers Overall transfer level: Needs assistance Equipment used: 1 person hand held assist Transfers: Sit to/from Stand Sit to Stand: Supervision, Contact guard assist                  Balance Overall balance assessment: Needs assistance Sitting-balance support: Feet supported Sitting balance-Leahy Scale: Good     Standing balance support: Single extremity supported, During functional activity Standing balance-Leahy Scale: Fair                             ADL either performed or assessed with clinical judgement   ADL Overall ADL's : Needs assistance/impaired     Grooming: Wash/dry hands;Contact guard assist;Minimal assistance;Standing                   Toilet Transfer: Minimal Actuary and Hygiene: Minimal assistance               Vision Baseline Vision/History: 1 Wears glasses Patient Visual Report: No change from baseline              Pertinent Vitals/Pain Pain Assessment Pain Assessment: No/denies pain     Extremity/Trunk Assessment Upper Extremity Assessment Upper Extremity Assessment: Generalized weakness   Lower Extremity Assessment Lower Extremity Assessment: Generalized weakness       Communication Communication Communication: No apparent difficulties   Cognition Arousal: Alert Behavior During Therapy: Flat affect Cognition: Cognition impaired   Orientation impairments: Time Awareness: Intellectual  awareness impaired, Online awareness impaired   Attention impairment (select first level of impairment): Sustained attention Executive functioning impairment (select all impairments): Reasoning, Problem solving,  Sequencing                   Following commands: Impaired Following commands impaired: Follows one step commands inconsistently, Follows one step commands with increased time     Cueing  General Comments   Cueing Techniques: Verbal cues;Gestural cues;Tactile cues;Visual cues              Home Living Family/patient expects to be discharged to:: Private residence Living Arrangements: Spouse/significant other;Children Available Help at Discharge: Family;Available 24 hours/day Type of Home: House Home Access: Stairs to enter [employer] of Steps: 1   Home Layout: Two level;Bed/bath upstairs;Other (Comment) (pt reports sleeping on couch downstairs and uses shower upstairs with help) Alternate Level Stairs-Number of Steps: flight   Bathroom Shower/Tub: Tub/shower unit;Walk-in shower         Home Equipment: Agricultural consultant (2 wheels);Transport chair          Prior Functioning/Environment Prior Level of Function : Independent/Modified Independent;Needs assist               ADLs Comments: family assists her when she is not doing well and she is not left alone. Husband takes her to appointments. They use transport chair when she is unwell    OT Problem List: Decreased strength;Impaired balance (sitting and/or standing);Decreased safety awareness;Decreased activity tolerance;Decreased cognition   OT Treatment/Interventions: Self-care/ADL training;Therapeutic exercise;Patient/family education;Balance training;Energy conservation;Therapeutic activities      OT Goals(Current goals can be found in the care plan section)   Acute Rehab OT Goals Patient Stated Goal: to go home and get stronger OT Goal Formulation: With patient/family Time For Goal Achievement: 05/30/24 Potential to Achieve Goals: Fair ADL Goals Pt Will Perform Grooming: with supervision;standing Pt Will Perform Lower Body Dressing: with supervision;sit to/from stand Pt Will  Transfer to Toilet: with supervision;ambulating Pt Will Perform Toileting - Clothing Manipulation and hygiene: with supervision;sit to/from stand   OT Frequency:  Min 2X/week       AM-PAC OT 6 Clicks Daily Activity     Outcome Measure Help from another person eating meals?: None Help from another person taking care of personal grooming?: A Little Help from another person toileting, which includes using toliet, bedpan, or urinal?: A Little Help from another person bathing (including washing, rinsing, drying)?: A Little Help from another person to put on and taking off regular upper body clothing?: A Little Help from another person to put on and taking off regular lower body clothing?: A Little 6 Click Score: 19   End of Session Nurse Communication: Mobility status  Activity Tolerance: Patient limited by fatigue Patient left: in bed;with call bell/phone within reach;with bed alarm set;with family/visitor present  OT Visit Diagnosis: Unsteadiness on feet (R26.81);Repeated falls (R29.6);Muscle weakness (generalized) (M62.81)                Time: 8554-8488 OT Time Calculation (min): 26 min Charges:  OT General Charges $OT Visit: 1 Visit OT Evaluation $OT Eval Moderate Complexity: 1 Mod OT Treatments $Self Care/Home Management : 8-22 mins  Izetta Claude, MS, OTR/L , CBIS ascom [phone]  05/16/24, 4:46 PM

## 2024-05-16 NOTE — TOC CM/SW Note (Signed)
 Transition of Care [facility]) CM/SW Note    Transition of Care [facility]) - Inpatient Brief Assessment   Patient Details  Name: Gabriela White MRN: [id number] Date of Birth: 1980/08/16  Transition of Care [facility]) CM/SW Contact:    Alfonso Rummer, LCSW Phone Number: 05/16/2024, 12:56 PM   Clinical Narrative:  KEN DELENA Rummer completed TOC chart review. No TOC needs identified please contact should needs arise  Transition of Care Asessment: Insurance and Status: Insurance coverage has been reviewed Patient has primary care physician: Yes [facility] [employer]) Home environment has been reviewed: singlel family home   Prior/Current Home Services: No current home services Social Drivers of Health Review: SDOH reviewed no interventions necessary Readmission risk has been reviewed: No Transition of care needs: no transition of care needs at this time

## 2024-05-16 NOTE — Progress Notes (Signed)
 [facility]  ROUNDING NOTE   Subjective:   Patient seen laying in bed No family at bedside Withdrawn today Appetite remains poor Reports she may have drank more yesterday  Sodium 123  Objective:  Vital signs in last 24 hours:  Temp:  [97.9 F (36.6 C)-98.7 F (37.1 C)] 98.1 F (36.7 C) (10/06 0800) Pulse Rate:  [78-90] 81 (10/06 0800) Resp:  [16-18] 18 (10/06 0800) BP: (107-128)/(68-96) 107/68 (10/06 0800) SpO2:  [94 %-99 %] 96 % (10/06 0800)  Weight change:  Filed Weights   05/14/24 0129 05/15/24 0500  Weight: 62.3 kg 61.8 kg    Intake/Output: I/O last 3 completed shifts: In: 100 [P.O.:100] Out: -    Intake/Output this shift:  No intake/output data recorded.  Physical Exam: General: NAD, withdrawn  Head: Normocephalic, atraumatic. Moist oral mucosal membranes  Eyes: Anicteric  Lungs:  Clear to auscultation, normal effort  Heart: Regular rate and rhythm  Abdomen:  Soft, nontender  Extremities:  No peripheral edema.  Neurologic: Awake, alert, conversant  Skin: Warm,dry, no rash       Basic Metabolic Panel: Recent Labs  Lab 05/14/24 0537 05/14/24 1156 05/14/24 1615 05/15/24 0718 05/15/24 1436 05/15/24 1958 05/16/24 0441 05/16/24 1231  NA 120* 117*   < > 124* 124* 122* 123* 124*  K 3.7 3.4*  --  3.1*  --  3.2* 3.4*  --   CL 93* 88*  --  95*  --  92* 95*  --   CO2 19* 19*  --  22  --  20* 21*  --   GLUCOSE 80 77  --  95  --  106* 91  --   BUN 18 14  --  13  --  13 14  --   CREATININE 0.60 0.66  --  0.69  --  0.69 0.64  --   CALCIUM  7.6* 7.6*  --  7.8*  --  8.1* 8.0*  --   MG 2.2  --   --   --   --  2.5*  --   --   PHOS 1.5*  --   --   --   --   --   --   --    < > = values in this interval not displayed.    Liver Function Tests: Recent Labs  Lab 05/13/24 2155  AST 37  ALT 25  ALKPHOS 62  BILITOT 0.6  PROT 6.2*  ALBUMIN 2.9*   No results for input(s): LIPASE, AMYLASE in the last 168 hours. No results for input(s):  AMMONIA in the last 168 hours.  CBC: Recent Labs  Lab 05/13/24 2155 05/14/24 1156 05/15/24 1958 05/16/24 0440 05/16/24 1231  WBC 14.1* 17.2* 24.0* 23.6* 20.4*  NEUTROABS 11.0*  --   --  19.5*  --   HGB 12.8 11.7* 11.6* 12.5 11.3*  HCT 36.1 31.5* 31.2* 33.7* RESULTS UNAVAILABLE DUE TO INTERFERING SUBSTANCE  MCV 93.0 87.5 88.1 88.5 RESULTS UNAVAILABLE DUE TO INTERFERING SUBSTANCE  PLT PLATELET CLUMPS NOTED ON SMEAR, UNABLE TO ESTIMATE 104* 96* 107* 95*    Cardiac Enzymes: No results for input(s): CKTOTAL, CKMB, CKMBINDEX, TROPONINI in the last 168 hours.  BNP: Invalid input(s): POCBNP  CBG: No results for input(s): GLUCAP in the last 168 hours.  Microbiology: Results for orders placed or performed during the hospital encounter of 05/13/24  Culture, blood (Routine X 2) w Reflex to ID Panel     Status: None (Preliminary result)   Collection Time:  05/13/24  9:55 PM   Specimen: BLOOD  Result Value Ref Range Status   Specimen Description BLOOD BLOOD RIGHT HAND  Final   Special Requests   Final    BOTTLES DRAWN AEROBIC ONLY Blood Culture results may not be optimal due to an inadequate volume of blood received in culture bottles   Culture   Final    NO GROWTH 2 DAYS Performed at [facility], [address] [place], [place] [address]    Report Status PENDING  Incomplete  Culture, blood (Routine X 2) w Reflex to ID Panel     Status: None (Preliminary result)   Collection Time: 05/13/24  9:55 PM   Specimen: BLOOD  Result Value Ref Range Status   Specimen Description BLOOD BLOOD RIGHT WRIST  Final   Special Requests   Final    BOTTLES DRAWN AEROBIC AND ANAEROBIC Blood Culture results may not be optimal due to an inadequate volume of blood received in culture bottles   Culture   Final    NO GROWTH 2 DAYS Performed at [facility], [address] [place], [place] [address]    Report Status PENDING  Incomplete  Urine Culture     Status:  Abnormal   Collection Time: 05/14/24  1:53 AM   Specimen: Urine, Random  Result Value Ref Range Status   Specimen Description   Final    URINE, RANDOM Performed at [facility], [address] [place], [place] [address]    Special Requests   Final    NONE Reflexed from [id number] Performed at [facility], [address] [place], [place] [address]    Culture >=100,000 COLONIES/mL ESCHERICHIA COLI (A)  Final   Report Status 05/16/2024 FINAL  Final   Organism ID, Bacteria ESCHERICHIA COLI (A)  Final      Susceptibility   Escherichia coli - MIC*    AMPICILLIN <=2 SENSITIVE Sensitive     CEFAZOLIN (URINE) Value in next row Sensitive      2 SENSITIVEThis is a modified FDA-approved test that has been validated and its performance characteristics determined by the reporting laboratory.  This laboratory is certified under the Clinical Laboratory Improvement Amendments CLIA as qualified to perform high complexity clinical laboratory testing.    CEFEPIME Value in next row Sensitive      2 SENSITIVEThis is a modified FDA-approved test that has been validated and its performance characteristics determined by the reporting laboratory.  This laboratory is certified under the Clinical Laboratory Improvement Amendments CLIA as qualified to perform high complexity clinical laboratory testing.    ERTAPENEM Value in next row Sensitive      2 SENSITIVEThis is a modified FDA-approved test that has been validated and its performance characteristics determined by the reporting laboratory.  This laboratory is certified under the Clinical Laboratory Improvement Amendments CLIA as qualified to perform high complexity clinical laboratory testing.    CEFTRIAXONE Value in next row Sensitive      2 SENSITIVEThis is a modified FDA-approved test that has been validated and its performance characteristics determined by the reporting laboratory.  This laboratory is certified under the Clinical Laboratory  Improvement Amendments CLIA as qualified to perform high complexity clinical laboratory testing.    CIPROFLOXACIN Value in next row Sensitive      2 SENSITIVEThis is a modified FDA-approved test that has been validated and its performance characteristics determined by the reporting laboratory.  This laboratory is certified under the Clinical Laboratory Improvement Amendments CLIA as  qualified to perform high complexity clinical laboratory testing.    GENTAMICIN Value in next row Sensitive      2 SENSITIVEThis is a modified FDA-approved test that has been validated and its performance characteristics determined by the reporting laboratory.  This laboratory is certified under the Clinical Laboratory Improvement Amendments CLIA as qualified to perform high complexity clinical laboratory testing.    NITROFURANTOIN Value in next row Sensitive      2 SENSITIVEThis is a modified FDA-approved test that has been validated and its performance characteristics determined by the reporting laboratory.  This laboratory is certified under the Clinical Laboratory Improvement Amendments CLIA as qualified to perform high complexity clinical laboratory testing.    TRIMETH/SULFA Value in next row Sensitive      2 SENSITIVEThis is a modified FDA-approved test that has been validated and its performance characteristics determined by the reporting laboratory.  This laboratory is certified under the Clinical Laboratory Improvement Amendments CLIA as qualified to perform high complexity clinical laboratory testing.    AMPICILLIN/SULBACTAM Value in next row Sensitive      2 SENSITIVEThis is a modified FDA-approved test that has been validated and its performance characteristics determined by the reporting laboratory.  This laboratory is certified under the Clinical Laboratory Improvement Amendments CLIA as qualified to perform high complexity clinical laboratory testing.    PIP/TAZO Value in next row Sensitive      <=4  SENSITIVEThis is a modified FDA-approved test that has been validated and its performance characteristics determined by the reporting laboratory.  This laboratory is certified under the Clinical Laboratory Improvement Amendments CLIA as qualified to perform high complexity clinical laboratory testing.    MEROPENEM Value in next row Sensitive      <=4 SENSITIVEThis is a modified FDA-approved test that has been validated and its performance characteristics determined by the reporting laboratory.  This laboratory is certified under the Clinical Laboratory Improvement Amendments CLIA as qualified to perform high complexity clinical laboratory testing.    * >=100,000 COLONIES/mL ESCHERICHIA COLI  Resp panel by RT-PCR (RSV, Flu A&B, Covid) Anterior Nasal Swab     Status: None   Collection Time: 05/16/24  1:30 PM   Specimen: Anterior Nasal Swab  Result Value Ref Range Status   SARS Coronavirus 2 by RT PCR NEGATIVE NEGATIVE Final    Comment: (NOTE) SARS-CoV-2 target nucleic acids are NOT DETECTED.  The SARS-CoV-2 RNA is generally detectable in upper respiratory specimens during the acute phase of infection. The lowest concentration of SARS-CoV-2 viral copies this assay can detect is 138 copies/mL. A negative result does not preclude SARS-Cov-2 infection and should not be used as the sole basis for treatment or other patient management decisions. A negative result may occur with  improper specimen collection/handling, submission of specimen other than nasopharyngeal swab, presence of viral mutation(s) within the areas targeted by this assay, and inadequate number of viral copies(<138 copies/mL). A negative result must be combined with clinical observations, patient history, and epidemiological information. The expected result is Negative.  Fact Sheet for Patients:  BloggerCourse.com  Fact Sheet for Healthcare Providers:  SeriousBroker.it  This  test is no t yet approved or cleared by the [place]  FDA and  has been authorized for detection and/or diagnosis of SARS-CoV-2 by FDA under an Emergency Use Authorization (EUA). This EUA will remain  in effect (meaning this test can be used) for the duration of the COVID-19 declaration under Section 564(b)(1) of the Act, 21 U.S.C.section 360bbb-3(b)(1), unless the authorization is  terminated  or revoked sooner.       Influenza A by PCR NEGATIVE NEGATIVE Final   Influenza B by PCR NEGATIVE NEGATIVE Final    Comment: (NOTE) The Xpert Xpress SARS-CoV-2/FLU/RSV plus assay is intended as an aid in the diagnosis of influenza from Nasopharyngeal swab specimens and should not be used as a sole basis for treatment. Nasal washings and aspirates are unacceptable for Xpert Xpress SARS-CoV-2/FLU/RSV testing.  Fact Sheet for Patients: BloggerCourse.com  Fact Sheet for Healthcare Providers: SeriousBroker.it  This test is not yet approved or cleared by the [place]  FDA and has been authorized for detection and/or diagnosis of SARS-CoV-2 by FDA under an Emergency Use Authorization (EUA). This EUA will remain in effect (meaning this test can be used) for the duration of the COVID-19 declaration under Section 564(b)(1) of the Act, 21 U.S.C. section 360bbb-3(b)(1), unless the authorization is terminated or revoked.     Resp Syncytial Virus by PCR NEGATIVE NEGATIVE Final    Comment: (NOTE) Fact Sheet for Patients: BloggerCourse.com  Fact Sheet for Healthcare Providers: SeriousBroker.it  This test is not yet approved or cleared by the [place]  FDA and has been authorized for detection and/or diagnosis of SARS-CoV-2 by FDA under an Emergency Use Authorization (EUA). This EUA will remain in effect (meaning this test can be used) for the duration of the COVID-19 declaration under  Section 564(b)(1) of the Act, 21 U.S.C. section 360bbb-3(b)(1), unless the authorization is terminated or revoked.  Performed at [facility], [address] [place], [place] [address]     Coagulation Studies: No results for input(s): LABPROT, INR in the last 72 hours.  Urinalysis: Recent Labs    05/14/24 0153  COLORURINE YELLOW*  LABSPEC 1.011  PHURINE 5.0  GLUCOSEU NEGATIVE  HGBUR SMALL*  BILIRUBINUR NEGATIVE  KETONESUR 5*  PROTEINUR 30*  NITRITE NEGATIVE  LEUKOCYTESUR LARGE*      Imaging: No results found.   Medications:    cefTRIAXone (ROCEPHIN)  IV 1 g (05/16/24 1310)    cloBAZam   15 mg Oral BID   divalproex   1,000 mg Oral BID   enoxaparin (LOVENOX) injection  40 mg Subcutaneous Q24H   feeding supplement  237 mL Oral TID BM   folic acid   1 mg Oral Daily   lacosamide   200 mg Oral BID   LORazepam   0-4 mg Intravenous Q12H   multivitamin with minerals  1 tablet Oral Daily   nicotine   21 mg Transdermal Daily   sodium chloride   1 g Oral BID WC   thiamine   100 mg Oral Daily   Or   thiamine   100 mg Intravenous Daily   acetaminophen , hydrALAZINE, LORazepam  **OR** LORazepam , LORazepam , ondansetron  (ZOFRAN ) IV  Assessment/ Plan:  Gabriela White is a 43 y.o.  female with past medical conditions including GERD, anemia, seizures, thrombocytopenia, tobacco and alcohol abuse, hypertension, and chronic hyponatremia, who was admitted to [facility] on 05/13/2024 for Hypokalemia [E87.6] Gastroenteritis [K52.9] Hyponatremia [E87.1] Hypothermia, initial encounter [T68.XXXA] Nausea and vomiting, unspecified vomiting type [R11.2]   Acute on chronic hyponatremia.  Patient appears chronically low, baseline appears to be 129-132.  Admitted with as sodium 122, decreased to 117.  Likely secondary to beer [place]oto mania with decreased solute intake.  Presenting symptoms more concerning for alcohol withdrawal. Patient encouraged to diversify meals.   Sodium  decreased today, 123. Patient states she drank more fluid beyond her restriction. Encouraged her to monitor her fluid intake today and increase solute intake. May be  a candidate for antidepressant with appetite stimulant effects.     LOS: 2 Vidit Boissonneault 10/6/20252:32 PM

## 2024-05-16 NOTE — Plan of Care (Signed)

## 2024-05-16 NOTE — Progress Notes (Signed)
 PROGRESS NOTE    Gabriela White   FMW:[id number] DOB: [birth year]  DOA: 05/13/2024 Date of Service: 05/16/24 which is hospital day 2  PCP: [facility], [facility] course / significant events:   HPI:  HPI: Gabriela White is a 43 y.o. female with medical history significant of seizure, hypertension, GERD, tobacco abuse, alcohol abuse, anemia, GI bleeding, thrombocytopenia, hyponatremia, who presents with nausea, vomiting, diarrhea, and fall.   10/03: to ED, hyponatremia worse from baseline. UA concerning for UTI  10/04: admit to hospitalist. Nausea resolved, no BM. Of note reports typically she does not eat very much and has trouble adding salt to foods, just doesn't want to eat anything. On chart review longstanding hyponatremia. Sodium down as low as 117 here so no plans for dc at this time.  10/05: remains low Na. Nephrology consult - likely cause baseline poor diet and EtOH. TSH WNL. Cortisol high not low.   10/06: remain low Na but somewhat improved. WBC trending up - has been on rocephin for UTI though no symptoms. Smokers cough but no SOB, no HA, no nausea, still poor appetite but she states about her normal, no abd pain, no fever. Question viral illness as cause for presenting symptoms and sodium may just be at baseline but will work up for respiratory infection / lung pathology  may contribute to SIADH?      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 Multiple potential contributing factors: low intake essentially tea/toast type diet, neuro/psych medications, likely GI losses w/ diarrhea, recent N/V question if GI problem caused sodium loss or if low  sodium caused N/V symptoms,  Current labs:  Serum Na: 120-122 Serum Osm: 250 (low) Urine Na: <30 Urine Osm: 352 Other: TSH WNL.  Cortisol not low  Question if need renal wasting workup, urine sodium is low but her intake is so low that may not be much to waste - nephrology consult - no concerns for this at this time  --> Strict 1200 mL fluid restriction --> Ensure --> nephrology ok to dc if/when sodium is 127 or higher --> Continue frequent sodium checks  --> Given chronicity, do not think 3% saline is warranted   Nausea, resolved Leukocytosis Cough, attributed to smokers cough  No headache, no rash, no urinary or GI complaints to point to infection source  --> CXR and resp viral panel  Abnormal UA but no dysuria/frequency --> await culture - (+)Ecoli --> ceftriaxone has been given daily in context of leukocytosis / uncertain cause for fatigue/nausea   Hypothermia - resolved --> monitor vitals   Cannabinoid (+)UDS ?hyperemesis syndrome but nausea is not a chronic issue for her  --> avoid use recreational substances    Patient with seizure activity outside of alcohol use 06/27/2022.  Witnessed by husband who described generalized rhythmic shaking of all limbs when she is asleep. Patient follows with [facility] neurology and NSG. Currently prescribed Depakote , Vimpat , and Onfi . Neurology to manage seizure medications. S/p vagal nerve stimulator implanted 08/2023, improved seizures.  Home meds restarted all po  --> Depakote  1000 mg BID --> Vimpat  200 mg BID --> Onfi  20 mg BID   Weakness, generalized --> PT/OT to see   DVT  prophylaxis: lovenox IV fluids: no continuous IV fluids  Nutrition: regular Central lines / other devices: none  Code Status: FULL CODE ACP documentation reviewed:  none on file in [facility]  TOC needs: TBD pend PT/OT eval  Medical barriers to dispo: hyponatermia, leukocytosis. Expected medical readiness for discharge 1-2 days.                Subjective / Brief ROS:  Patient reports tired, still poor appetite but this is normal for her Denies new weakness or focal weakness, just generally feeling weak / tired  Tolerating diet  Reports no concerns w/ urination/defecation - denies dysuria, hematuria, frequency in particular, no diarrhea or abd pain, repors smokers cough about her normal or a little worse here, no fever, no chills, no headache  Family Communication: mom at bedside on rounds and spoke w/ husband on speakerphone     Objective Findings:  Vitals:   05/15/24 1400 05/15/24 2045 05/16/24 0428 05/16/24 0800  BP: 116/72 109/72 (!) 128/96 107/68  Pulse: 95 78 90 81  Resp: 18 16 16 18   Temp: 98.9 F (37.2 C) 97.9 F (36.6 C) 98.7 F (37.1 C) 98.1 F (36.7 C)  TempSrc: Axillary     SpO2: 97% 99% 94% 96%  Weight:      Height:        Intake/Output Summary (Last 24 hours) at 05/16/2024 1326 Last data filed at 05/15/2024 1413 Gross per 24 hour  Intake 0 ml  Output --  Net 0 ml   Filed Weights   05/14/24 0129 05/15/24 0500  Weight: 62.3 kg 61.8 kg    Examination:  Physical Exam Constitutional:      General: She is not in acute distress. Cardiovascular:     Rate and Rhythm: Normal rate and regular rhythm.     Heart sounds: Normal heart sounds.  Pulmonary:     Effort: Pulmonary effort is normal. No respiratory distress.     Breath sounds: Normal breath sounds.  Abdominal:     General: Bowel sounds are normal. There is no distension.     Palpations: Abdomen is soft.     Tenderness: There is no abdominal tenderness.  Musculoskeletal:     Right lower leg: No edema.     Left lower leg: No edema.  Skin:    General: Skin is warm and dry.  Neurological:     General: No focal deficit present.     Mental Status: She is alert and oriented to person, place, and time.  Psychiatric:        Mood and Affect: Mood normal.        Behavior: Behavior normal.          Scheduled  Medications:   cloBAZam   15 mg Oral BID   divalproex   1,000 mg Oral BID   enoxaparin (LOVENOX) injection  40 mg Subcutaneous Q24H   feeding supplement  237 mL Oral TID BM   folic acid   1 mg Oral Daily   lacosamide   200 mg Oral BID   LORazepam   0-4 mg Intravenous Q12H   multivitamin with minerals  1 tablet Oral Daily   nicotine   21 mg Transdermal Daily   sodium chloride   1 g Oral BID WC   thiamine   100 mg Oral Daily   Or   thiamine   100 mg Intravenous Daily    Continuous Infusions:  cefTRIAXone (ROCEPHIN)  IV 1 g (05/16/24 1310)    PRN Medications:  acetaminophen , hydrALAZINE, LORazepam  **OR** LORazepam ,  LORazepam , ondansetron  (ZOFRAN ) IV  Antimicrobials from admission:  Anti-infectives (From admission, onward)    Start     Dose/Rate Route Frequency Ordered Stop   05/14/24 1400  cefTRIAXone (ROCEPHIN) 1 g in sodium chloride  0.9 % 100 mL IVPB        1 g 200 mL/hr over 30 Minutes Intravenous Every 24 hours 05/14/24 1259             Data Reviewed:  I have personally reviewed the following...  CBC: Recent Labs  Lab 05/13/24 2155 05/14/24 1156 05/15/24 1958 05/16/24 0440  WBC 14.1* 17.2* 24.0* 23.6*  NEUTROABS 11.0*  --   --  19.5*  HGB 12.8 11.7* 11.6* 12.5  HCT 36.1 31.5* 31.2* 33.7*  MCV 93.0 87.5 88.1 88.5  PLT PLATELET CLUMPS NOTED ON SMEAR, UNABLE TO ESTIMATE 104* 96* 107*   Basic Metabolic Panel: Recent Labs  Lab 05/14/24 0537 05/14/24 1156 05/14/24 1615 05/15/24 0718 05/15/24 1436 05/15/24 1958 05/16/24 0441 05/16/24 1231  NA 120* 117*   < > 124* 124* 122* 123* 124*  K 3.7 3.4*  --  3.1*  --  3.2* 3.4*  --   CL 93* 88*  --  95*  --  92* 95*  --   CO2 19* 19*  --  22  --  20* 21*  --   GLUCOSE 80 77  --  95  --  106* 91  --   BUN 18 14  --  13  --  13 14  --   CREATININE 0.60 0.66  --  0.69  --  0.69 0.64  --   CALCIUM  7.6* 7.6*  --  7.8*  --  8.1* 8.0*  --   MG 2.2  --   --   --   --  2.5*  --   --   PHOS 1.5*  --   --   --   --   --   --    --    < > = values in this interval not displayed.   GFR: Estimated Creatinine Clearance: 79.3 mL/min (by C-G formula based on SCr of 0.64 mg/dL). Liver Function Tests: Recent Labs  Lab 05/13/24 2155  AST 37  ALT 25  ALKPHOS 62  BILITOT 0.6  PROT 6.2*  ALBUMIN 2.9*   No results for input(s): LIPASE, AMYLASE in the last 168 hours. No results for input(s): AMMONIA in the last 168 hours. Coagulation Profile: No results for input(s): INR, PROTIME in the last 168 hours. Cardiac Enzymes: No results for input(s): CKTOTAL, CKMB, CKMBINDEX, TROPONINI in the last 168 hours. BNP (last 3 results) No results for input(s): PROBNP in the last 8760 hours. HbA1C: No results for input(s): HGBA1C in the last 72 hours. CBG: No results for input(s): GLUCAP in the last 168 hours. Lipid Profile: No results for input(s): CHOL, HDL, LDLCALC, TRIG, CHOLHDL, LDLDIRECT in the last 72 hours. Thyroid Function Tests: Recent Labs    05/13/24 2155  TSH 1.526   Anemia Panel: No results for input(s): VITAMINB12, FOLATE, FERRITIN, TIBC, IRON, RETICCTPCT in the last 72 hours. Most Recent Urinalysis On File:     Component Value Date/Time   COLORURINE YELLOW (A) 05/14/2024 0153   APPEARANCEUR CLOUDY (A) 05/14/2024 0153   LABSPEC 1.011 05/14/2024 0153   PHURINE 5.0 05/14/2024 0153   GLUCOSEU NEGATIVE 05/14/2024 0153   HGBUR SMALL (A) 05/14/2024 0153   BILIRUBINUR NEGATIVE 05/14/2024 0153   KETONESUR 5 (A) 05/14/2024 0153   PROTEINUR  30 (A) 05/14/2024 0153   NITRITE NEGATIVE 05/14/2024 0153   LEUKOCYTESUR LARGE (A) 05/14/2024 0153   Sepsis Labs: @LABRCNTIP (procalcitonin:4,lacticidven:4) Microbiology: Recent Results (from the past 240 hours)  Culture, blood (Routine X 2) w Reflex to ID Panel     Status: None (Preliminary result)   Collection Time: 05/13/24  9:55 PM   Specimen: BLOOD  Result Value Ref Range Status   Specimen Description BLOOD BLOOD  RIGHT HAND  Final   Special Requests   Final    BOTTLES DRAWN AEROBIC ONLY Blood Culture results may not be optimal due to an inadequate volume of blood received in culture bottles   Culture   Final    NO GROWTH 2 DAYS Performed at [facility], [address] [place], [place] [address]    Report Status PENDING  Incomplete  Culture, blood (Routine X 2) w Reflex to ID Panel     Status: None (Preliminary result)   Collection Time: 05/13/24  9:55 PM   Specimen: BLOOD  Result Value Ref Range Status   Specimen Description BLOOD BLOOD RIGHT WRIST  Final   Special Requests   Final    BOTTLES DRAWN AEROBIC AND ANAEROBIC Blood Culture results may not be optimal due to an inadequate volume of blood received in culture bottles   Culture   Final    NO GROWTH 2 DAYS Performed at [facility], [address], [place], [place] [address]    Report Status PENDING  Incomplete  Urine Culture     Status: Abnormal   Collection Time: 05/14/24  1:53 AM   Specimen: Urine, Random  Result Value Ref Range Status   Specimen Description   Final    URINE, RANDOM Performed at [facility], [address] [place], [place] [address]    Special Requests   Final    NONE Reflexed from [phone] Performed at [facility], [address] [place], [place] [address]    Culture >=100,000 COLONIES/mL ESCHERICHIA COLI (A)  Final   Report Status 05/16/2024 FINAL  Final   Organism ID, Bacteria ESCHERICHIA COLI (A)  Final      Susceptibility   Escherichia coli - MIC*    AMPICILLIN <=2 SENSITIVE Sensitive     CEFAZOLIN (URINE) Value in next row Sensitive      2 SENSITIVEThis is a modified FDA-approved test that has been validated and its performance characteristics determined by the reporting laboratory.  This laboratory is certified under the Clinical Laboratory Improvement Amendments CLIA as qualified to perform high complexity clinical laboratory testing.    CEFEPIME Value in next  row Sensitive      2 SENSITIVEThis is a modified FDA-approved test that has been validated and its performance characteristics determined by the reporting laboratory.  This laboratory is certified under the Clinical Laboratory Improvement Amendments CLIA as qualified to perform high complexity clinical laboratory testing.    ERTAPENEM Value in next row Sensitive      2 SENSITIVEThis is a modified FDA-approved test that has been validated and its performance characteristics determined by the reporting laboratory.  This laboratory is certified under the Clinical Laboratory Improvement Amendments CLIA as qualified to perform high complexity clinical laboratory testing.    CEFTRIAXONE Value in next row Sensitive      2 SENSITIVEThis is a modified FDA-approved test that has been validated and its performance characteristics determined by the reporting laboratory.  This laboratory is certified under the Clinical Laboratory Improvement Amendments CLIA as qualified to  perform high complexity clinical laboratory testing.    CIPROFLOXACIN Value in next row Sensitive      2 SENSITIVEThis is a modified FDA-approved test that has been validated and its performance characteristics determined by the reporting laboratory.  This laboratory is certified under the Clinical Laboratory Improvement Amendments CLIA as qualified to perform high complexity clinical laboratory testing.    GENTAMICIN Value in next row Sensitive      2 SENSITIVEThis is a modified FDA-approved test that has been validated and its performance characteristics determined by the reporting laboratory.  This laboratory is certified under the Clinical Laboratory Improvement Amendments CLIA as qualified to perform high complexity clinical laboratory testing.    NITROFURANTOIN Value in next row Sensitive      2 SENSITIVEThis is a modified FDA-approved test that has been validated and its performance characteristics determined by the reporting laboratory.   This laboratory is certified under the Clinical Laboratory Improvement Amendments CLIA as qualified to perform high complexity clinical laboratory testing.    TRIMETH/SULFA Value in next row Sensitive      2 SENSITIVEThis is a modified FDA-approved test that has been validated and its performance characteristics determined by the reporting laboratory.  This laboratory is certified under the Clinical Laboratory Improvement Amendments CLIA as qualified to perform high complexity clinical laboratory testing.    AMPICILLIN/SULBACTAM Value in next row Sensitive      2 SENSITIVEThis is a modified FDA-approved test that has been validated and its performance characteristics determined by the reporting laboratory.  This laboratory is certified under the Clinical Laboratory Improvement Amendments CLIA as qualified to perform high complexity clinical laboratory testing.    PIP/TAZO Value in next row Sensitive      <=4 SENSITIVEThis is a modified FDA-approved test that has been validated and its performance characteristics determined by the reporting laboratory.  This laboratory is certified under the Clinical Laboratory Improvement Amendments CLIA as qualified to perform high complexity clinical laboratory testing.    MEROPENEM Value in next row Sensitive      <=4 SENSITIVEThis is a modified FDA-approved test that has been validated and its performance characteristics determined by the reporting laboratory.  This laboratory is certified under the Clinical Laboratory Improvement Amendments CLIA as qualified to perform high complexity clinical laboratory testing.    * >=100,000 COLONIES/mL ESCHERICHIA COLI      Radiology Studies last 3 days: CT HEAD WO CONTRAST ( ) Result Date: 05/14/2024 EXAM: CT HEAD WITHOUT CONTRAST 05/14/2024 02:10:36 AM TECHNIQUE: CT of the head was performed without the administration of intravenous contrast. Automated exposure control, iterative reconstruction, and/or weight based  adjustment of the mA/kV was utilized to reduce the radiation dose to as low as reasonably achievable. COMPARISON: 02/11/2023 CLINICAL HISTORY: Head trauma, moderate-severe. Nausea, vomiting, and diarrhea for 24 hours. Multiple episodes where the patient seemed post-ictal, though no witnessed seizure. Chills (no known fevers) and generalized weakness. Decreased PO intake, 20 lbs weight loss in recent months. FINDINGS: BRAIN AND VENTRICLES: No acute hemorrhage, evidence of acute infarct, hydrocephalus, or extra-axial collection. No mass effect or midline shift. ORBITS: No acute abnormality. SINUSES: No acute abnormality. SOFT TISSUES AND SKULL: No acute soft tissue abnormality or skull fracture. IMPRESSION: 1. No acute intracranial abnormality Electronically signed by: Franky Stanford MD 05/14/2024 03:55 AM EDT RP Workstation: [id number]         Laneta Blunt, DO [facility] 05/16/2024, 1:26 PM    Dictation software may have been used to generate the above note. Typos may occur  and escape review in typed/dictated notes. Please contact Dr Marsa directly for clarity if needed.  Staff may message me via secure chat in Epic  but this may not receive an immediate response,  please page me for urgent matters!  If 7PM-7AM, please contact night coverage [email or web address]

## 2024-05-17 ENCOUNTER — Inpatient Hospital Stay

## 2024-05-17 DIAGNOSIS — K529 Noninfective gastroenteritis and colitis, unspecified: Secondary | ICD-10-CM | POA: Diagnosis not present

## 2024-05-17 LAB — CBC
Hemoglobin: 11.2 g/dL — ABNORMAL LOW (ref 12.0–15.0)
Platelets: 84 K/uL — ABNORMAL LOW (ref 150–400)
WBC: 14.4 K/uL — ABNORMAL HIGH (ref 4.0–10.5)

## 2024-05-17 LAB — BASIC METABOLIC PANEL WITH GFR
Anion gap: 7 (ref 5–15)
Anion gap: 7 (ref 5–15)
BUN: 13 mg/dL (ref 6–20)
BUN: 12 mg/dL (ref 6–20)
CO2: 21 mmol/L — ABNORMAL LOW (ref 22–32)
CO2: 23 mmol/L (ref 22–32)
Calcium: 7.8 mg/dL — ABNORMAL LOW (ref 8.9–10.3)
Calcium: 7.9 mg/dL — ABNORMAL LOW (ref 8.9–10.3)
Chloride: 97 mmol/L — ABNORMAL LOW (ref 98–111)
Chloride: 97 mmol/L — ABNORMAL LOW (ref 98–111)
Creatinine, Ser: 0.55 mg/dL (ref 0.44–1.00)
Creatinine, Ser: 0.64 mg/dL (ref 0.44–1.00)
GFR, Estimated: >60 mL/min (ref >60)
GFR, Estimated: >60 mL/min (ref >60)
Glucose, Bld: 74 mg/dL (ref 70–99)
Glucose, Bld: 71 mg/dL (ref 70–99)
Potassium: 2.7 mmol/L — CL (ref 3.5–5.1)
Potassium: 3.6 mmol/L (ref 3.5–5.1)
Sodium: 125 mmol/L — ABNORMAL LOW (ref 135–145)
Sodium: 127 mmol/L — ABNORMAL LOW (ref 135–145)

## 2024-05-17 LAB — MAGNESIUM: Magnesium: 2.3 mg/dL (ref 1.7–2.4)

## 2024-05-17 MED ORDER — POTASSIUM CHLORIDE CRYS ER 20 MEQ PO TBCR
40.0000 meq | EXTENDED_RELEASE_TABLET | Freq: Once | ORAL | Status: AC
  Administered 2024-05-17: 40 meq via ORAL
  Filled 2024-05-17: qty 2

## 2024-05-17 MED ORDER — POTASSIUM CHLORIDE 10 MEQ/100ML IV SOLN
10.0000 meq | INTRAVENOUS | Status: DC
  Administered 2024-05-17: 10 meq via INTRAVENOUS
  Filled 2024-05-17: qty 100

## 2024-05-17 MED ORDER — POTASSIUM CHLORIDE 10 MEQ/100ML IV SOLN
10.0000 meq | INTRAVENOUS | Status: DC
Start: 2024-05-17 — End: 2024-05-17

## 2024-05-17 MED ORDER — POTASSIUM CHLORIDE 10 MEQ/100ML IV SOLN: 10.0000 meq | INTRAVENOUS | Status: DC

## 2024-05-17 MED ORDER — LORAZEPAM 2 MG/ML IJ SOLN: 5.0000 mg | Freq: Once | INTRAMUSCULAR | Status: DC | PRN

## 2024-05-17 MED ORDER — POTASSIUM CHLORIDE 10 MEQ/100ML IV SOLN
10.0000 meq | INTRAVENOUS | Status: AC
  Administered 2024-05-17 (×3): 10 meq via INTRAVENOUS
  Filled 2024-05-17 (×3): qty 100

## 2024-05-17 NOTE — Progress Notes (Signed)
 PHARMACY CONSULT NOTE - FOLLOW UP  Pharmacy Consult for Electrolyte Monitoring and Replacement   Recent Labs: Potassium (mmol/L)  Date Value  05/17/2024 2.7 (LL)   Magnesium  (mg/dL)  Date Value  89/94/7974 2.5 (H)   Calcium  (mg/dL)  Date Value  89/92/7974 7.8 (L)   Albumin (g/dL)  Date Value  89/96/7974 2.9 (L)  08/16/2021 3.9   Phosphorus (mg/dL)  Date Value  89/95/7974 1.5 (L)   Sodium (mmol/L)  Date Value  05/17/2024 125 (L)  01/02/2021 135   Corrected Ca:  8.7   (Ca 7.8  alb 2.9)  Assessment: 43 yo F admitted w/ Nausea, vomiting, diarrhea, and fall, hyponatremia, Hx ETOH, seizures, anemia, GIB,  thrombocytopenia, hyponatremia.  Cannabinoid (+)UDS   Goal of Therapy:  Electrolytes WNL   Plan:  K 2.7    KCl 10 mEq IV X 4 ordered.  PER RN: had to slow infusion. Order changed to run each bag over 2 hours  for the 3 bags not given yet, and added KCL 40 meq po x 1. - recheck BMP/Mag at 2000 -f/u electrolytes in am   Suzann Allean LABOR ,PharmD Clinical Pharmacist 05/17/2024 10:44 AM

## 2024-05-17 NOTE — Evaluation (Signed)
 Physical Therapy Evaluation Patient Details Name: Gabriela White MRN: [id number] DOB: 28-Nov-1980 Today's Date: 05/17/2024  History of Present Illness  43 y.o. female with medical history significant of seizure, hypertension, GERD, tobacco abuse, alcohol abuse, anemia, GI bleeding, thrombocytopenia, hyponatremia, who presents with nausea, vomiting, diarrhea, and fall.  Clinical Impression  Patient is agreeable to PT. She reports walking short distance around her home using furniture for support intermittently as needed. Today patient walked a lap in the hallway with rolling walker with no shortness of breath. She required occasional CGA for safety and had mild scissoring. She is hopeful for discharge home soon with family support. Recommend PT follow up while in the hospital to maximize independence and decrease caregiver burden.     If plan is discharge home, recommend the following: Help with stairs or ramp for entrance;Assistance with cooking/housework   Can travel by private vehicle        Equipment Recommendations None recommended by PT  Recommendations for Other Services       Functional Status Assessment Patient has had a recent decline in their functional status and demonstrates the ability to make significant improvements in function in a reasonable and predictable amount of time.     Precautions / Restrictions Precautions Precautions: Fall Recall of Precautions/Restrictions: Intact Restrictions Weight Bearing Restrictions Per Provider Order: No      Mobility  Bed Mobility Overal bed mobility: Needs Assistance Bed Mobility: Supine to Sit, Sit to Supine     Supine to sit: Supervision Sit to supine: Supervision        Transfers Overall transfer level: Needs assistance Equipment used: Rolling walker (2 wheels), 1 person hand held assist Transfers: Sit to/from Stand, Bed to chair/wheelchair/BSC Sit to Stand: Supervision   Step pivot transfers:  Supervision            Ambulation/Gait Ambulation/Gait assistance: Contact guard assist, Supervision Gait Distance (Feet): 160 Feet Assistive device: Rolling walker (2 wheels) Gait Pattern/deviations: Step-through pattern, Scissoring Gait velocity: decreased     General Gait Details: occasional scissoring with ambulation. no shortness of breath with activity. navigational cues with mobility. encouraged patient to use rolling walker at home as needed  Stairs            Wheelchair Mobility     Tilt Bed    Modified Rankin (Stroke Patients Only)       Balance Overall balance assessment: Needs assistance Sitting-balance support: Feet supported Sitting balance-Leahy Scale: Good     Standing balance support: Single extremity supported, During functional activity Standing balance-Leahy Scale: Fair                               Pertinent Vitals/Pain Pain Assessment Pain Assessment: No/denies pain    Home Living Family/patient expects to be discharged to:: Private residence Living Arrangements: Spouse/significant other;Children Available Help at Discharge: Family;Available 24 hours/day Type of Home: House Home Access: Stairs to enter   Entrance Stairs-Number of Steps: 1 Alternate Level Stairs-Number of Steps: flight Home Layout: Two level;Bed/bath upstairs;Other (Comment) (has been sleeping on couch downstairs) Home Equipment: Rolling Walker (2 wheels);Transport chair      Prior Function Prior Level of Function : Independent/Modified Independent;Needs assist             Mobility Comments: furniture walking around the house. ADLs Comments: family assists her when she is not doing well and she is not left alone. Husband takes her to appointments. They  use transport chair when she is unwell     Extremity/Trunk Assessment   Upper Extremity Assessment Upper Extremity Assessment: Generalized weakness    Lower Extremity Assessment Lower  Extremity Assessment: Generalized weakness       Communication   Communication Communication: No apparent difficulties    Cognition Arousal: Alert Behavior During Therapy: Flat affect   PT - Cognitive impairments: No family/caregiver present to determine baseline, Initiation                         Following commands: Impaired Following commands impaired: Follows one step commands with increased time     Cueing Cueing Techniques: Verbal cues, Visual cues     General Comments      Exercises     Assessment/Plan    PT Assessment Patient needs continued PT services  PT Problem List Decreased strength;Decreased range of motion;Decreased activity tolerance;Decreased balance;Decreased mobility;Decreased cognition       PT Treatment Interventions DME instruction;Gait training;Stair training;Functional mobility training;Therapeutic exercise;Balance training;Therapeutic activities;Neuromuscular re-education;Patient/family education;Cognitive remediation    PT Goals (Current goals can be found in the Care Plan section)  Acute Rehab PT Goals Patient Stated Goal: home as soon as possible PT Goal Formulation: With patient Time For Goal Achievement: 05/31/24 Potential to Achieve Goals: Fair    Frequency Min 1X/week     Co-evaluation               AM-PAC PT 6 Clicks Mobility  Outcome Measure Help needed turning from your back to your side while in a flat bed without using bedrails?: None Help needed moving from lying on your back to sitting on the side of a flat bed without using bedrails?: A Little Help needed moving to and from a bed to a chair (including a wheelchair)?: A Little Help needed standing up from a chair using your arms (e.g., wheelchair or bedside chair)?: A Little Help needed to walk in hospital room?: A Little Help needed climbing 3-5 steps with a railing? : A Little 6 Click Score: 19    End of Session   Activity Tolerance: Patient  tolerated treatment well Patient left: in bed;with call bell/phone within reach   PT Visit Diagnosis: Muscle weakness (generalized) (M62.81);Unsteadiness on feet (R26.81)    Time: 8592-8565 PT Time Calculation (min) (ACUTE ONLY): 27 min   Charges:   PT Evaluation $PT Eval Low Complexity: 1 Low PT Treatments $Therapeutic Activity: 8-22 mins PT General Charges $$ ACUTE PT VISIT: 1 Visit         Randine Essex, PT, MPT   Randine LULLA Essex 05/17/2024, 2:59 PM

## 2024-05-17 NOTE — Progress Notes (Signed)
 PROGRESS NOTE    Masen Salvas   FMW:[id number] DOB: [birth year]  DOA: 05/13/2024 Date of Service: 05/17/24 which is hospital day 3  PCP: [facility] course / significant events:   HPI:  HPI: Aamirah Salmi is a 43 y.o. female with medical history significant of seizure, hypertension, GERD, tobacco abuse, alcohol abuse, anemia, GI bleeding, thrombocytopenia, hyponatremia, who presents with nausea, vomiting, diarrhea, and fall.   10/03: to ED, hyponatremia worse from baseline. UA concerning for UTI  10/04: admit to hospitalist. Nausea resolved, no BM. Of note reports typically she does not eat very much and has trouble adding salt to foods, just doesn't want to eat anything. On chart review longstanding hyponatremia. Sodium down as low as 117 here so no plans for dc at this time.  10/05: remains low Na. Nephrology consult - likely cause baseline poor diet and EtOH. TSH WNL. Cortisol high not low.   10/06: remain low Na but somewhat improved. WBC trending up - has been on rocephin for UTI though no symptoms. Smokers cough but no SOB, no HA, no nausea, still poor appetite but she states about her normal, no abd pain, no fever. Question viral illness as cause for presenting symptoms and sodium may just be at baseline but will work up for respiratory infection / lung pathology  may contribute to SIADH?  10/07: resp w/u non-revealing and WBC improving. 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 Multiple potential contributing factors: low intake essentially tea/toast type diet, neuro/psych medications, likely GI losses w/ diarrhea,  recent N/V question if GI problem caused sodium loss or if low sodium caused N/V symptoms,  Current labs:  Serum Na: 120-122 --> 125 and has not gone higher  Serum Osm: 250 (low) Urine Na: <30 Urine Osm: 352 Other: TSH WNL.  Cortisol not low  Question if need renal wasting workup, urine sodium is low but her intake is so low that may not be much to waste - nephrology consult - no concerns for this at this time  Per pharmacist review meds, possible ADR of hyponatremia fairly low: Coreg 1-3%, Depakote - listed in post marketing (no %), Lacosamide  - listed in post marketing (no %) so no strong association w/ medications  --> Strict 1200 mL fluid restriction pt has not been consistently compliant w/ this  --> Ensure --> nephrology ok to dc if/when sodium is 127 or higher, if liver US  (+)cirrhosis can adjust goal closer to 125 --> Continue frequent sodium checks  --> salt tabs  --> Given chronicity, no 3% saline is warranted  --> RUQ US  pending if cirrhotic would tolerate lower Na level as above  --> trending labs   Nausea, resolved Leukocytosis - improving  Cough, attributed to smokers cough  No headache, no rash, no urinary or GI complaints to point to infection source  --> CXR and resp viral panel no concerns but still suspect possible viral illness   Abnormal UA but no dysuria/frequency --> culture (+)Ecoli --> ceftriaxone has been given daily in context of leukocytosis / uncertain cause for fatigue/nausea   Hypothermia - resolved --> monitor vitals   Cannabinoid (+)UDS ?hyperemesis syndrome  but nausea is not a chronic issue for her  --> avoid use recreational substances    Patient with seizure activity outside of alcohol use 06/27/2022.  Witnessed by husband who described generalized rhythmic shaking of all limbs when she is asleep. Patient follows with [facility] neurology and NSG. Currently prescribed Depakote , Vimpat , and Onfi . Neurology to manage seizure medications. S/p vagal  nerve stimulator implanted 08/2023, improved seizures.  Home meds restarted all po  --> Depakote  1000 mg BID --> Vimpat  200 mg BID --> Onfi  20 mg BID   Weakness, generalized --> PT/OT to see   DVT prophylaxis: lovenox IV fluids: no continuous IV fluids  Nutrition: regular Central lines / other devices: none  Code Status: FULL CODE ACP documentation reviewed:  none on file in [facility]  TOC needs: TBD pend PT/OT eval  Medical barriers to dispo: hyponatermia, leukocytosis. Expected medical readiness for discharge 1-2 days.            Subjective / Brief ROS:  Patient reports tired, still poor appetite but this is normal for her - no change past few days  Denies new weakness or focal weakness,  Tolerating diet low appetite Reports no concerns w/ urination/defecation   Family Communication: w/ husband on speakerphone     Objective Findings:  Vitals:   05/16/24 1537 05/16/24 1930 05/17/24 0417 05/17/24 1739  BP: 106/70 103/67 112/69 122/80  Pulse: 83 85 82 78  Resp: 17 16 16 18   Temp: 98.5 F (36.9 C) 98.2 F (36.8 C) 98.6 F (37 C) 98.1 F (36.7 C)  TempSrc:    Oral  SpO2: 100% 99% 96% 97%  Weight:      Height:        Intake/Output Summary (Last 24 hours) at 05/17/2024 1745 Last data filed at 05/16/2024 2100 Gross per 24 hour  Intake 240 ml  Output --  Net 240 ml   Filed Weights   05/14/24 0129 05/15/24 0500  Weight: 62.3 kg 61.8 kg    Examination:  Physical Exam Constitutional:      General: She is not in acute distress. Cardiovascular:     Rate and Rhythm: Normal rate and regular rhythm.     Heart sounds: Normal heart sounds.  Pulmonary:     Effort: Pulmonary effort is normal. No respiratory distress.     Breath sounds: Normal breath sounds.  Abdominal:     General: Bowel sounds are normal. There is no distension.     Palpations: Abdomen is soft.     Tenderness: There is no abdominal tenderness.  Musculoskeletal:     Right lower leg: No  edema.     Left lower leg: No edema.  Skin:    General: Skin is warm and dry.  Neurological:     General: No focal deficit present.     Mental Status: She is alert and oriented to person, place, and time.  Psychiatric:        Mood and Affect: Mood normal.        Behavior: Behavior normal.          Scheduled Medications:   cloBAZam   15 mg Oral BID   divalproex   1,000 mg Oral BID   enoxaparin (LOVENOX) injection  40 mg Subcutaneous Q24H   feeding supplement  237 mL Oral TID BM   folic acid   1 mg Oral Daily   lacosamide   200 mg Oral BID   multivitamin with minerals  1 tablet Oral Daily   nicotine   21  mg Transdermal Daily   sodium chloride   1 g Oral BID WC   thiamine   100 mg Oral Daily   Or   thiamine   100 mg Intravenous Daily    Continuous Infusions:  cefTRIAXone (ROCEPHIN)  IV 1 g (05/17/24 1305)   potassium chloride  10 mEq (05/17/24 1612)    PRN Medications:  acetaminophen , hydrALAZINE, LORazepam , ondansetron  (ZOFRAN ) IV  Antimicrobials from admission:  Anti-infectives (From admission, onward)    Start     Dose/Rate Route Frequency Ordered Stop   05/14/24 1400  cefTRIAXone (ROCEPHIN) 1 g in sodium chloride  0.9 % 100 mL IVPB        1 g 200 mL/hr over 30 Minutes Intravenous Every 24 hours 05/14/24 1259             Data Reviewed:  I have personally reviewed the following...  CBC: Recent Labs  Lab 05/13/24 2155 05/14/24 1156 05/15/24 1958 05/16/24 0440 05/16/24 1231 05/17/24 0439  WBC 14.1* 17.2* 24.0* 23.6* 20.4* 14.4*  NEUTROABS 11.0*  --   --  19.5*  --   --   HGB 12.8 11.7* 11.6* 12.5 11.3* 11.2*  HCT 36.1 31.5* 31.2* 33.7* RESULTS UNAVAILABLE DUE TO INTERFERING SUBSTANCE RESULTS UNAVAILABLE DUE TO INTERFERING SUBSTANCE  MCV 93.0 87.5 88.1 88.5 RESULTS UNAVAILABLE DUE TO INTERFERING SUBSTANCE RESULTS UNAVAILABLE DUE TO INTERFERING SUBSTANCE  PLT PLATELET CLUMPS NOTED ON SMEAR, UNABLE TO ESTIMATE 104* 96* 107* 95* 84*   Basic Metabolic  Panel: Recent Labs  Lab 05/14/24 0537 05/14/24 1156 05/14/24 1615 05/15/24 0718 05/15/24 1436 05/15/24 1958 05/16/24 0441 05/16/24 1231 05/17/24 0439  NA 120* 117*   < > 124* 124* 122* 123* 124* 125*  K 3.7 3.4*  --  3.1*  --  3.2* 3.4*  --  2.7*  CL 93* 88*  --  95*  --  92* 95*  --  97*  CO2 19* 19*  --  22  --  20* 21*  --  21*  GLUCOSE 80 77  --  95  --  106* 91  --  74  BUN 18 14  --  13  --  13 14  --  13  CREATININE 0.60 0.66  --  0.69  --  0.69 0.64  --  0.55  CALCIUM  7.6* 7.6*  --  7.8*  --  8.1* 8.0*  --  7.8*  MG 2.2  --   --   --   --  2.5*  --   --   --   PHOS 1.5*  --   --   --   --   --   --   --   --    < > = values in this interval not displayed.   GFR: Estimated Creatinine Clearance: 79.3 mL/min (by C-G formula based on SCr of 0.55 mg/dL). Liver Function Tests: Recent Labs  Lab 05/13/24 2155  AST 37  ALT 25  ALKPHOS 62  BILITOT 0.6  PROT 6.2*  ALBUMIN 2.9*   No results for input(s): LIPASE, AMYLASE in the last 168 hours. No results for input(s): AMMONIA in the last 168 hours. Coagulation Profile: No results for input(s): INR, PROTIME in the last 168 hours. Cardiac Enzymes: No results for input(s): CKTOTAL, CKMB, CKMBINDEX, TROPONINI in the last 168 hours. BNP (last 3 results) No results for input(s): PROBNP in the last 8760 hours. HbA1C: No results for input(s): HGBA1C in the last 72 hours. CBG: No results for input(s): GLUCAP in the last  168 hours. Lipid Profile: No results for input(s): CHOL, HDL, LDLCALC, TRIG, CHOLHDL, LDLDIRECT in the last 72 hours. Thyroid Function Tests: No results for input(s): TSH, T4TOTAL, FREET4, T3FREE, THYROIDAB in the last 72 hours.  Anemia Panel: No results for input(s): VITAMINB12, FOLATE, FERRITIN, TIBC, IRON, RETICCTPCT in the last 72 hours. Most Recent Urinalysis On File:     Component Value Date/Time   COLORURINE YELLOW (A) 05/14/2024 0153    APPEARANCEUR CLOUDY (A) 05/14/2024 0153   LABSPEC 1.011 05/14/2024 0153   PHURINE 5.0 05/14/2024 0153   GLUCOSEU NEGATIVE 05/14/2024 0153   HGBUR SMALL (A) 05/14/2024 0153   BILIRUBINUR NEGATIVE 05/14/2024 0153   KETONESUR 5 (A) 05/14/2024 0153   PROTEINUR 30 (A) 05/14/2024 0153   NITRITE NEGATIVE 05/14/2024 0153   LEUKOCYTESUR LARGE (A) 05/14/2024 0153   Sepsis Labs: @LABRCNTIP (procalcitonin:4,lacticidven:4) Microbiology: Recent Results (from the past 240 hours)  Culture, blood (Routine X 2) w Reflex to ID Panel     Status: None (Preliminary result)   Collection Time: 05/13/24  9:55 PM   Specimen: BLOOD  Result Value Ref Range Status   Specimen Description BLOOD BLOOD RIGHT HAND  Final   Special Requests   Final    BOTTLES DRAWN AEROBIC ONLY Blood Culture results may not be optimal due to an inadequate volume of blood received in culture bottles   Culture   Final    NO GROWTH 3 DAYS Performed at [facility], [address], [place], [place] [address]    Report Status PENDING  Incomplete  Culture, blood (Routine X 2) w Reflex to ID Panel     Status: None (Preliminary result)   Collection Time: 05/13/24  9:55 PM   Specimen: BLOOD  Result Value Ref Range Status   Specimen Description BLOOD BLOOD RIGHT WRIST  Final   Special Requests   Final    BOTTLES DRAWN AEROBIC AND ANAEROBIC Blood Culture results may not be optimal due to an inadequate volume of blood received in culture bottles   Culture   Final    NO GROWTH 3 DAYS Performed at [facility], [address], [place], [place] [address]    Report Status PENDING  Incomplete  Urine Culture     Status: Abnormal   Collection Time: 05/14/24  1:53 AM   Specimen: Urine, Random  Result Value Ref Range Status   Specimen Description   Final    URINE, RANDOM Performed at [facility], [address] [place], [place] [address]    Special Requests   Final    NONE Reflexed from [id number] Performed at  [facility], [address] [place], [place] [address]    Culture >=100,000 COLONIES/mL ESCHERICHIA COLI (A)  Final   Report Status 05/16/2024 FINAL  Final   Organism ID, Bacteria ESCHERICHIA COLI (A)  Final      Susceptibility   Escherichia coli - MIC*    AMPICILLIN <=2 SENSITIVE Sensitive     CEFAZOLIN (URINE) Value in next row Sensitive      2 SENSITIVEThis is a modified FDA-approved test that has been validated and its performance characteristics determined by the reporting laboratory.  This laboratory is certified under the Clinical Laboratory Improvement Amendments CLIA as qualified to perform high complexity clinical laboratory testing.    CEFEPIME Value in next row Sensitive      2 SENSITIVEThis is a modified FDA-approved test that has been validated and its performance characteristics determined by the reporting laboratory.  This laboratory is  certified under the Clinical Laboratory Improvement Amendments CLIA as qualified to perform high complexity clinical laboratory testing.    ERTAPENEM Value in next row Sensitive      2 SENSITIVEThis is a modified FDA-approved test that has been validated and its performance characteristics determined by the reporting laboratory.  This laboratory is certified under the Clinical Laboratory Improvement Amendments CLIA as qualified to perform high complexity clinical laboratory testing.    CEFTRIAXONE Value in next row Sensitive      2 SENSITIVEThis is a modified FDA-approved test that has been validated and its performance characteristics determined by the reporting laboratory.  This laboratory is certified under the Clinical Laboratory Improvement Amendments CLIA as qualified to perform high complexity clinical laboratory testing.    CIPROFLOXACIN Value in next row Sensitive      2 SENSITIVEThis is a modified FDA-approved test that has been validated and its performance characteristics determined by the reporting laboratory.  This laboratory  is certified under the Clinical Laboratory Improvement Amendments CLIA as qualified to perform high complexity clinical laboratory testing.    GENTAMICIN Value in next row Sensitive      2 SENSITIVEThis is a modified FDA-approved test that has been validated and its performance characteristics determined by the reporting laboratory.  This laboratory is certified under the Clinical Laboratory Improvement Amendments CLIA as qualified to perform high complexity clinical laboratory testing.    NITROFURANTOIN Value in next row Sensitive      2 SENSITIVEThis is a modified FDA-approved test that has been validated and its performance characteristics determined by the reporting laboratory.  This laboratory is certified under the Clinical Laboratory Improvement Amendments CLIA as qualified to perform high complexity clinical laboratory testing.    TRIMETH/SULFA Value in next row Sensitive      2 SENSITIVEThis is a modified FDA-approved test that has been validated and its performance characteristics determined by the reporting laboratory.  This laboratory is certified under the Clinical Laboratory Improvement Amendments CLIA as qualified to perform high complexity clinical laboratory testing.    AMPICILLIN/SULBACTAM Value in next row Sensitive      2 SENSITIVEThis is a modified FDA-approved test that has been validated and its performance characteristics determined by the reporting laboratory.  This laboratory is certified under the Clinical Laboratory Improvement Amendments CLIA as qualified to perform high complexity clinical laboratory testing.    PIP/TAZO Value in next row Sensitive      <=4 SENSITIVEThis is a modified FDA-approved test that has been validated and its performance characteristics determined by the reporting laboratory.  This laboratory is certified under the Clinical Laboratory Improvement Amendments CLIA as qualified to perform high complexity clinical laboratory testing.    MEROPENEM Value in  next row Sensitive      <=4 SENSITIVEThis is a modified FDA-approved test that has been validated and its performance characteristics determined by the reporting laboratory.  This laboratory is certified under the Clinical Laboratory Improvement Amendments CLIA as qualified to perform high complexity clinical laboratory testing.    * >=100,000 COLONIES/mL ESCHERICHIA COLI  Respiratory (~20 pathogens) panel by PCR     Status: None   Collection Time: 05/16/24  1:30 PM   Specimen: Nasopharyngeal Swab; Respiratory  Result Value Ref Range Status   Adenovirus NOT DETECTED NOT DETECTED Final   Coronavirus 229E NOT DETECTED NOT DETECTED Final    Comment: (NOTE) The Coronavirus on the Respiratory Panel, DOES NOT test for the novel  Coronavirus (2019 nCoV)    Coronavirus HKU1 NOT DETECTED  NOT DETECTED Final   Coronavirus NL63 NOT DETECTED NOT DETECTED Final   Coronavirus OC43 NOT DETECTED NOT DETECTED Final   Metapneumovirus NOT DETECTED NOT DETECTED Final   Rhinovirus / Enterovirus NOT DETECTED NOT DETECTED Final   Influenza A NOT DETECTED NOT DETECTED Final   Influenza B NOT DETECTED NOT DETECTED Final   Parainfluenza Virus 1 NOT DETECTED NOT DETECTED Final   Parainfluenza Virus 2 NOT DETECTED NOT DETECTED Final   Parainfluenza Virus 3 NOT DETECTED NOT DETECTED Final   Parainfluenza Virus 4 NOT DETECTED NOT DETECTED Final   Respiratory Syncytial Virus NOT DETECTED NOT DETECTED Final   Bordetella pertussis NOT DETECTED NOT DETECTED Final   Bordetella Parapertussis NOT DETECTED NOT DETECTED Final   Chlamydophila pneumoniae NOT DETECTED NOT DETECTED Final   Mycoplasma pneumoniae NOT DETECTED NOT DETECTED Final    Comment: Performed at [facility], [address] [place], [place] [address]  Resp panel by RT-PCR (RSV, Flu A&B, Covid) Anterior Nasal Swab     Status: None   Collection Time: 05/16/24  1:30 PM   Specimen: Anterior Nasal Swab  Result Value Ref Range Status   SARS Coronavirus  2 by RT PCR NEGATIVE NEGATIVE Final    Comment: (NOTE) SARS-CoV-2 target nucleic acids are NOT DETECTED.  The SARS-CoV-2 RNA is generally detectable in upper respiratory specimens during the acute phase of infection. The lowest concentration of SARS-CoV-2 viral copies this assay can detect is 138 copies/mL. A negative result does not preclude SARS-Cov-2 infection and should not be used as the sole basis for treatment or other patient management decisions. A negative result may occur with  improper specimen collection/handling, submission of specimen other than nasopharyngeal swab, presence of viral mutation(s) within the areas targeted by this assay, and inadequate number of viral copies(<138 copies/mL). A negative result must be combined with clinical observations, patient history, and epidemiological information. The expected result is Negative.  Fact Sheet for Patients:  BloggerCourse.com  Fact Sheet for Healthcare Providers:  SeriousBroker.it  This test is no t yet approved or cleared by the [place]  FDA and  has been authorized for detection and/or diagnosis of SARS-CoV-2 by FDA under an Emergency Use Authorization (EUA). This EUA will remain  in effect (meaning this test can be used) for the duration of the COVID-19 declaration under Section 564(b)(1) of the Act, 21 U.S.C.section 360bbb-3(b)(1), unless the authorization is terminated  or revoked sooner.       Influenza A by PCR NEGATIVE NEGATIVE Final   Influenza B by PCR NEGATIVE NEGATIVE Final    Comment: (NOTE) The Xpert Xpress SARS-CoV-2/FLU/RSV plus assay is intended as an aid in the diagnosis of influenza from Nasopharyngeal swab specimens and should not be used as a sole basis for treatment. Nasal washings and aspirates are unacceptable for Xpert Xpress SARS-CoV-2/FLU/RSV testing.  Fact Sheet for Patients: BloggerCourse.com  Fact  Sheet for Healthcare Providers: SeriousBroker.it  This test is not yet approved or cleared by the [place]  FDA and has been authorized for detection and/or diagnosis of SARS-CoV-2 by FDA under an Emergency Use Authorization (EUA). This EUA will remain in effect (meaning this test can be used) for the duration of the COVID-19 declaration under Section 564(b)(1) of the Act, 21 U.S.C. section 360bbb-3(b)(1), unless the authorization is terminated or revoked.     Resp Syncytial Virus by PCR NEGATIVE NEGATIVE Final    Comment: (NOTE) Fact Sheet for Patients: BloggerCourse.com  Fact Sheet for Healthcare Providers: SeriousBroker.it  This  test is not yet approved or cleared by the [place]  FDA and has been authorized for detection and/or diagnosis of SARS-CoV-2 by FDA under an Emergency Use Authorization (EUA). This EUA will remain in effect (meaning this test can be used) for the duration of the COVID-19 declaration under Section 564(b)(1) of the Act, 21 U.S.C. section 360bbb-3(b)(1), unless the authorization is terminated or revoked.  Performed at [facility], [address] [place], [place] [address]       Radiology Studies last 3 days: [facility] 1 View Result Date: 05/16/2024 CLINICAL DATA:  10031 Cough 10031 100030 Leukocytosis 100030 EXAM: PORTABLE CHEST 1 VIEW COMPARISON:  February 11, 2023 FINDINGS: The cardiomediastinal silhouette is unchanged in contour.LEFT neck stimulator device. No pleural effusion. No pneumothorax. Linear opacity in the LEFT lower lung most consistent with atelectasis. IMPRESSION: Linear opacity in the LEFT lower lung most consistent with atelectasis. Electronically Signed   By: Corean Salter M.D.   On: 05/16/2024 16:18   CT HEAD WO CONTRAST ( ) Result Date: 05/14/2024 EXAM: CT HEAD WITHOUT CONTRAST 05/14/2024 02:10:36 AM TECHNIQUE: CT of the head was  performed without the administration of intravenous contrast. Automated exposure control, iterative reconstruction, and/or weight based adjustment of the mA/kV was utilized to reduce the radiation dose to as low as reasonably achievable. COMPARISON: 02/11/2023 CLINICAL HISTORY: Head trauma, moderate-severe. Nausea, vomiting, and diarrhea for 24 hours. Multiple episodes where the patient seemed post-ictal, though no witnessed seizure. Chills (no known fevers) and generalized weakness. Decreased PO intake, 20 lbs weight loss in recent months. FINDINGS: BRAIN AND VENTRICLES: No acute hemorrhage, evidence of acute infarct, hydrocephalus, or extra-axial collection. No mass effect or midline shift. ORBITS: No acute abnormality. SINUSES: No acute abnormality. SOFT TISSUES AND SKULL: No acute soft tissue abnormality or skull fracture. IMPRESSION: 1. No acute intracranial abnormality Electronically signed by: Franky Stanford MD 05/14/2024 03:55 AM EDT RP Workstation: [id number]         Laneta Blunt, DO [facility] 05/17/2024, 5:45 PM    Dictation software may have been used to generate the above note. Typos may occur and escape review in typed/dictated notes. Please contact Dr Blunt directly for clarity if needed.  Staff may message me via secure chat in Epic  but this may not receive an immediate response,  please page me for urgent matters!  If 7PM-7AM, please contact night coverage [email or web address]

## 2024-05-17 NOTE — Progress Notes (Signed)
 Responded to consult for PIV placement. Pt is not agreeable to having new PIV placed at this time. Unit RN notified and advised to place consult at such time that pt is agreeable.

## 2024-05-17 NOTE — Progress Notes (Signed)
 [facility]  ROUNDING NOTE   Subjective:   Patient seen laying in bed Alert, withdrawn No family at bedside   Sodium 125  Objective:  Vital signs in last 24 hours:  Temp:  [98.2 F (36.8 C)-98.6 F (37 C)] 98.6 F (37 C) (10/07 0417) Pulse Rate:  [82-85] 82 (10/07 0417) Resp:  [16-17] 16 (10/07 0417) BP: (103-112)/(67-70) 112/69 (10/07 0417) SpO2:  [96 %-100 %] 96 % (10/07 0417)  Weight change:  Filed Weights   05/14/24 0129 05/15/24 0500  Weight: 62.3 kg 61.8 kg    Intake/Output: I/O last 3 completed shifts: In: 240 [P.O.:240] Out: -    Intake/Output this shift:  No intake/output data recorded.  Physical Exam: General: NAD, withdrawn  Head: Normocephalic, atraumatic. Moist oral mucosal membranes  Eyes: Anicteric  Lungs:  Clear to auscultation, normal effort  Heart: Regular rate and rhythm  Abdomen:  Soft, nontender  Extremities:  No peripheral edema.  Neurologic: Awake, alert  Skin: Warm,dry, no rash       Basic Metabolic Panel: Recent Labs  Lab 05/14/24 0537 05/14/24 1156 05/14/24 1615 05/15/24 0718 05/15/24 1436 05/15/24 1958 05/16/24 0441 05/16/24 1231 05/17/24 0439  NA 120* 117*   < > 124* 124* 122* 123* 124* 125*  K 3.7 3.4*  --  3.1*  --  3.2* 3.4*  --  2.7*  CL 93* 88*  --  95*  --  92* 95*  --  97*  CO2 19* 19*  --  22  --  20* 21*  --  21*  GLUCOSE 80 77  --  95  --  106* 91  --  74  BUN 18 14  --  13  --  13 14  --  13  CREATININE 0.60 0.66  --  0.69  --  0.69 0.64  --  0.55  CALCIUM  7.6* 7.6*  --  7.8*  --  8.1* 8.0*  --  7.8*  MG 2.2  --   --   --   --  2.5*  --   --   --   PHOS 1.5*  --   --   --   --   --   --   --   --    < > = values in this interval not displayed.    Liver Function Tests: Recent Labs  Lab 05/13/24 2155  AST 37  ALT 25  ALKPHOS 62  BILITOT 0.6  PROT 6.2*  ALBUMIN 2.9*   No results for input(s): LIPASE, AMYLASE in the last 168 hours. No results for input(s): AMMONIA in the last  168 hours.  CBC: Recent Labs  Lab 05/13/24 2155 05/14/24 1156 05/15/24 1958 05/16/24 0440 05/16/24 1231 05/17/24 0439  WBC 14.1* 17.2* 24.0* 23.6* 20.4* 14.4*  NEUTROABS 11.0*  --   --  19.5*  --   --   HGB 12.8 11.7* 11.6* 12.5 11.3* 11.2*  HCT 36.1 31.5* 31.2* 33.7* RESULTS UNAVAILABLE DUE TO INTERFERING SUBSTANCE RESULTS UNAVAILABLE DUE TO INTERFERING SUBSTANCE  MCV 93.0 87.5 88.1 88.5 RESULTS UNAVAILABLE DUE TO INTERFERING SUBSTANCE RESULTS UNAVAILABLE DUE TO INTERFERING SUBSTANCE  PLT PLATELET CLUMPS NOTED ON SMEAR, UNABLE TO ESTIMATE 104* 96* 107* 95* 84*    Cardiac Enzymes: No results for input(s): CKTOTAL, CKMB, CKMBINDEX, TROPONINI in the last 168 hours.  BNP: Invalid input(s): POCBNP  CBG: No results for input(s): GLUCAP in the last 168 hours.  Microbiology: Results for orders placed or performed during the hospital  encounter of 05/13/24  Culture, blood (Routine X 2) w Reflex to ID Panel     Status: None (Preliminary result)   Collection Time: 05/13/24  9:55 PM   Specimen: BLOOD  Result Value Ref Range Status   Specimen Description BLOOD BLOOD RIGHT HAND  Final   Special Requests   Final    BOTTLES DRAWN AEROBIC ONLY Blood Culture results may not be optimal due to an inadequate volume of blood received in culture bottles   Culture   Final    NO GROWTH 3 DAYS Performed at [facility], [address], [place], [place] [address]    Report Status PENDING  Incomplete  Culture, blood (Routine X 2) w Reflex to ID Panel     Status: None (Preliminary result)   Collection Time: 05/13/24  9:55 PM   Specimen: BLOOD  Result Value Ref Range Status   Specimen Description BLOOD BLOOD RIGHT WRIST  Final   Special Requests   Final    BOTTLES DRAWN AEROBIC AND ANAEROBIC Blood Culture results may not be optimal due to an inadequate volume of blood received in culture bottles   Culture   Final    NO GROWTH 3 DAYS Performed at [facility], [address] [place], [place] [address]    Report Status PENDING  Incomplete  Urine Culture     Status: Abnormal   Collection Time: 05/14/24  1:53 AM   Specimen: Urine, Random  Result Value Ref Range Status   Specimen Description   Final    URINE, RANDOM Performed at [facility], [address] [place], [place] [address]    Special Requests   Final    NONE Reflexed from [id number] Performed at [facility], [address] [place], [place] [address]    Culture >=100,000 COLONIES/mL ESCHERICHIA COLI (A)  Final   Report Status 05/16/2024 FINAL  Final   Organism ID, Bacteria ESCHERICHIA COLI (A)  Final      Susceptibility   Escherichia coli - MIC*    AMPICILLIN <=2 SENSITIVE Sensitive     CEFAZOLIN (URINE) Value in next row Sensitive      2 SENSITIVEThis is a modified FDA-approved test that has been validated and its performance characteristics determined by the reporting laboratory.  This laboratory is certified under the Clinical Laboratory Improvement Amendments CLIA as qualified to perform high complexity clinical laboratory testing.    CEFEPIME Value in next row Sensitive      2 SENSITIVEThis is a modified FDA-approved test that has been validated and its performance characteristics determined by the reporting laboratory.  This laboratory is certified under the Clinical Laboratory Improvement Amendments CLIA as qualified to perform high complexity clinical laboratory testing.    ERTAPENEM Value in next row Sensitive      2 SENSITIVEThis is a modified FDA-approved test that has been validated and its performance characteristics determined by the reporting laboratory.  This laboratory is certified under the Clinical Laboratory Improvement Amendments CLIA as qualified to perform high complexity clinical laboratory testing.    CEFTRIAXONE Value in next row Sensitive      2 SENSITIVEThis is a modified FDA-approved test that has been validated and its performance  characteristics determined by the reporting laboratory.  This laboratory is certified under the Clinical Laboratory Improvement Amendments CLIA as qualified to perform high complexity clinical laboratory testing.    CIPROFLOXACIN Value in next row Sensitive      2 SENSITIVEThis is a modified FDA-approved test  that has been validated and its performance characteristics determined by the reporting laboratory.  This laboratory is certified under the Clinical Laboratory Improvement Amendments CLIA as qualified to perform high complexity clinical laboratory testing.    GENTAMICIN Value in next row Sensitive      2 SENSITIVEThis is a modified FDA-approved test that has been validated and its performance characteristics determined by the reporting laboratory.  This laboratory is certified under the Clinical Laboratory Improvement Amendments CLIA as qualified to perform high complexity clinical laboratory testing.    NITROFURANTOIN Value in next row Sensitive      2 SENSITIVEThis is a modified FDA-approved test that has been validated and its performance characteristics determined by the reporting laboratory.  This laboratory is certified under the Clinical Laboratory Improvement Amendments CLIA as qualified to perform high complexity clinical laboratory testing.    TRIMETH/SULFA Value in next row Sensitive      2 SENSITIVEThis is a modified FDA-approved test that has been validated and its performance characteristics determined by the reporting laboratory.  This laboratory is certified under the Clinical Laboratory Improvement Amendments CLIA as qualified to perform high complexity clinical laboratory testing.    AMPICILLIN/SULBACTAM Value in next row Sensitive      2 SENSITIVEThis is a modified FDA-approved test that has been validated and its performance characteristics determined by the reporting laboratory.  This laboratory is certified under the Clinical Laboratory Improvement Amendments CLIA as qualified to  perform high complexity clinical laboratory testing.    PIP/TAZO Value in next row Sensitive      <=4 SENSITIVEThis is a modified FDA-approved test that has been validated and its performance characteristics determined by the reporting laboratory.  This laboratory is certified under the Clinical Laboratory Improvement Amendments CLIA as qualified to perform high complexity clinical laboratory testing.    MEROPENEM Value in next row Sensitive      <=4 SENSITIVEThis is a modified FDA-approved test that has been validated and its performance characteristics determined by the reporting laboratory.  This laboratory is certified under the Clinical Laboratory Improvement Amendments CLIA as qualified to perform high complexity clinical laboratory testing.    * >=100,000 COLONIES/mL ESCHERICHIA COLI  Respiratory (~20 pathogens) panel by PCR     Status: None   Collection Time: 05/16/24  1:30 PM   Specimen: Nasopharyngeal Swab; Respiratory  Result Value Ref Range Status   Adenovirus NOT DETECTED NOT DETECTED Final   Coronavirus 229E NOT DETECTED NOT DETECTED Final    Comment: (NOTE) The Coronavirus on the Respiratory Panel, DOES NOT test for the novel  Coronavirus (2019 nCoV)    Coronavirus HKU1 NOT DETECTED NOT DETECTED Final   Coronavirus NL63 NOT DETECTED NOT DETECTED Final   Coronavirus OC43 NOT DETECTED NOT DETECTED Final   Metapneumovirus NOT DETECTED NOT DETECTED Final   Rhinovirus / Enterovirus NOT DETECTED NOT DETECTED Final   Influenza A NOT DETECTED NOT DETECTED Final   Influenza B NOT DETECTED NOT DETECTED Final   Parainfluenza Virus 1 NOT DETECTED NOT DETECTED Final   Parainfluenza Virus 2 NOT DETECTED NOT DETECTED Final   Parainfluenza Virus 3 NOT DETECTED NOT DETECTED Final   Parainfluenza Virus 4 NOT DETECTED NOT DETECTED Final   Respiratory Syncytial Virus NOT DETECTED NOT DETECTED Final   Bordetella pertussis NOT DETECTED NOT DETECTED Final   Bordetella Parapertussis NOT DETECTED  NOT DETECTED Final   Chlamydophila pneumoniae NOT DETECTED NOT DETECTED Final   Mycoplasma pneumoniae NOT DETECTED NOT DETECTED Final    Comment: Performed  at [facility], [address] [place], [place] [address]  Resp panel by RT-PCR (RSV, Flu A&B, Covid) Anterior Nasal Swab     Status: None   Collection Time: 05/16/24  1:30 PM   Specimen: Anterior Nasal Swab  Result Value Ref Range Status   SARS Coronavirus 2 by RT PCR NEGATIVE NEGATIVE Final    Comment: (NOTE) SARS-CoV-2 target nucleic acids are NOT DETECTED.  The SARS-CoV-2 RNA is generally detectable in upper respiratory specimens during the acute phase of infection. The lowest concentration of SARS-CoV-2 viral copies this assay can detect is 138 copies/mL. A negative result does not preclude SARS-Cov-2 infection and should not be used as the sole basis for treatment or other patient management decisions. A negative result may occur with  improper specimen collection/handling, submission of specimen other than nasopharyngeal swab, presence of viral mutation(s) within the areas targeted by this assay, and inadequate number of viral copies(<138 copies/mL). A negative result must be combined with clinical observations, patient history, and epidemiological information. The expected result is Negative.  Fact Sheet for Patients:  BloggerCourse.com  Fact Sheet for Healthcare Providers:  SeriousBroker.it  This test is no t yet approved or cleared by the [place]  FDA and  has been authorized for detection and/or diagnosis of SARS-CoV-2 by FDA under an Emergency Use Authorization (EUA). This EUA will remain  in effect (meaning this test can be used) for the duration of the COVID-19 declaration under Section 564(b)(1) of the Act, 21 U.S.C.section 360bbb-3(b)(1), unless the authorization is terminated  or revoked sooner.       Influenza A by PCR NEGATIVE NEGATIVE Final    Influenza B by PCR NEGATIVE NEGATIVE Final    Comment: (NOTE) The Xpert Xpress SARS-CoV-2/FLU/RSV plus assay is intended as an aid in the diagnosis of influenza from Nasopharyngeal swab specimens and should not be used as a sole basis for treatment. Nasal washings and aspirates are unacceptable for Xpert Xpress SARS-CoV-2/FLU/RSV testing.  Fact Sheet for Patients: BloggerCourse.com  Fact Sheet for Healthcare Providers: SeriousBroker.it  This test is not yet approved or cleared by the [place]  FDA and has been authorized for detection and/or diagnosis of SARS-CoV-2 by FDA under an Emergency Use Authorization (EUA). This EUA will remain in effect (meaning this test can be used) for the duration of the COVID-19 declaration under Section 564(b)(1) of the Act, 21 U.S.C. section 360bbb-3(b)(1), unless the authorization is terminated or revoked.     Resp Syncytial Virus by PCR NEGATIVE NEGATIVE Final    Comment: (NOTE) Fact Sheet for Patients: BloggerCourse.com  Fact Sheet for Healthcare Providers: SeriousBroker.it  This test is not yet approved or cleared by the [place]  FDA and has been authorized for detection and/or diagnosis of SARS-CoV-2 by FDA under an Emergency Use Authorization (EUA). This EUA will remain in effect (meaning this test can be used) for the duration of the COVID-19 declaration under Section 564(b)(1) of the Act, 21 U.S.C. section 360bbb-3(b)(1), unless the authorization is terminated or revoked.  Performed at [facility], [address] [place], [place] [address]     Coagulation Studies: No results for input(s): LABPROT, INR in the last 72 hours.  Urinalysis: No results for input(s): COLORURINE, LABSPEC, PHURINE, GLUCOSEU, HGBUR, BILIRUBINUR, KETONESUR, PROTEINUR, UROBILINOGEN, NITRITE, LEUKOCYTESUR in the  last 72 hours.  Invalid input(s): APPERANCEUR     Imaging: DG Chest Port 1 View Result Date: 05/16/2024 CLINICAL DATA:  10031 Cough 10031 100030 Leukocytosis 100030 EXAM: PORTABLE CHEST 1  VIEW COMPARISON:  February 11, 2023 FINDINGS: The cardiomediastinal silhouette is unchanged in contour.LEFT neck stimulator device. No pleural effusion. No pneumothorax. Linear opacity in the LEFT lower lung most consistent with atelectasis. IMPRESSION: Linear opacity in the LEFT lower lung most consistent with atelectasis. Electronically Signed   By: Corean Salter M.D.   On: 05/16/2024 16:18     Medications:    cefTRIAXone (ROCEPHIN)  IV 1 g (05/16/24 1310)   potassium chloride  10 mEq (05/17/24 1103)    cloBAZam   15 mg Oral BID   divalproex   1,000 mg Oral BID   enoxaparin (LOVENOX) injection  40 mg Subcutaneous Q24H   feeding supplement  237 mL Oral TID BM   folic acid   1 mg Oral Daily   lacosamide   200 mg Oral BID   multivitamin with minerals  1 tablet Oral Daily   nicotine   21 mg Transdermal Daily   sodium chloride   1 g Oral BID WC   thiamine   100 mg Oral Daily   Or   thiamine   100 mg Intravenous Daily   acetaminophen , hydrALAZINE, LORazepam , ondansetron  (ZOFRAN ) IV  Assessment/ Plan:  Ms. Gabriela White is a 43 y.o.  female with past medical conditions including GERD, anemia, seizures, thrombocytopenia, tobacco and alcohol abuse, hypertension, and chronic hyponatremia, who was admitted to [facility] on 05/13/2024 for Hypokalemia [E87.6] Gastroenteritis [K52.9] Hyponatremia [E87.1] Hypothermia, initial encounter [T68.XXXA] Nausea and vomiting, unspecified vomiting type [R11.2]   Acute on chronic hyponatremia.  Patient appears chronically low, baseline appears to be 129-132.  Admitted with as sodium 122, decreased to 117.  Likely secondary to beer [place]oto mania with decreased solute intake.  Presenting symptoms more concerning for alcohol withdrawal. Patient encouraged to diversify meals.    Sodium improved today, 125. Awaiting Liver US  to evaluate cirrhosis. This could be cause of hyponatremia. Continue fluid restriction and encourage oral intake.     LOS: 3 Gabriela White 10/7/20251:03 PM

## 2024-05-17 NOTE — Progress Notes (Signed)
 PT Cancellation Note  Patient Details Name: Rheannon Cerney MRN: [id number] DOB: 1980/08/23   Cancelled Treatment:    Reason Eval/Treat Not Completed: Patient at procedure or test/unavailable (PT will continue with attempts)  Randine Essex, PT, MPT  Randine LULLA Essex 05/17/2024, 10:11 AM

## 2024-05-17 NOTE — Progress Notes (Signed)
 PHARMACY CONSULT NOTE - FOLLOW UP  Pharmacy Consult for Electrolyte Monitoring and Replacement   Recent Labs: Potassium (mmol/L)  Date Value  05/17/2024 2.7 (LL)   Magnesium  (mg/dL)  Date Value  89/94/7974 2.5 (H)   Calcium  (mg/dL)  Date Value  89/92/7974 7.8 (L)   Albumin (g/dL)  Date Value  89/96/7974 2.9 (L)  08/16/2021 3.9   Phosphorus (mg/dL)  Date Value  89/95/7974 1.5 (L)   Sodium (mmol/L)  Date Value  05/17/2024 125 (L)  01/02/2021 135     Assessment: 10/7:  K @ 0439 = 2.7   Goal of Therapy:  Electrolytes WNL   Plan:  KCl 10 mEq IV X 4 ordered for 10/7 @ 0800. - recheck electrolytes on 10/7 @ 1300.   Gabriela White ,PharmD Clinical Pharmacist 05/17/2024 6:48 AM

## 2024-05-18 DIAGNOSIS — K529 Noninfective gastroenteritis and colitis, unspecified: Secondary | ICD-10-CM | POA: Diagnosis not present

## 2024-05-18 LAB — BASIC METABOLIC PANEL WITH GFR
Anion gap: 6 (ref 5–15)
BUN: 13 mg/dL (ref 6–20)
CO2: 22 mmol/L (ref 22–32)
Calcium: 7.9 mg/dL — ABNORMAL LOW (ref 8.9–10.3)
Chloride: 100 mmol/L (ref 98–111)
Creatinine, Ser: 0.46 mg/dL (ref 0.44–1.00)
GFR, Estimated: >60 mL/min (ref >60)
Glucose, Bld: 72 mg/dL (ref 70–99)
Potassium: 3.3 mmol/L — ABNORMAL LOW (ref 3.5–5.1)
Sodium: 128 mmol/L — ABNORMAL LOW (ref 135–145)

## 2024-05-18 LAB — MAGNESIUM: Magnesium: 2.3 mg/dL (ref 1.7–2.4)

## 2024-05-18 LAB — PHOSPHORUS: Phosphorus: 3.2 mg/dL (ref 2.5–4.6)

## 2024-05-18 MED ORDER — POTASSIUM CHLORIDE CRYS ER 20 MEQ PO TBCR
40.0000 meq | EXTENDED_RELEASE_TABLET | ORAL | Status: AC
  Administered 2024-05-18 (×2): 40 meq via ORAL
  Filled 2024-05-18 (×2): qty 2

## 2024-05-18 NOTE — TOC Initial Note (Signed)
 Transition of Care [facility]) - Initial/Assessment Note    Patient Details  Name: Gabriela White MRN: [id number] Date of Birth: 03/19/1981  Transition of Care [facility]) CM/SW Contact:    Corean ONEIDA Haddock, RN Phone Number: 05/18/2024, 4:38 PM  Clinical Narrative:                  Attempted to meet with patient at bedside to discussed recs for [facility].  Patient very drowsy and unable to keep eyes open for assessment.  Bedside RN Darice notified        Patient Goals and CMS Choice            Expected Discharge Plan and Services                                              Prior Living Arrangements/Services                       Activities of Daily Living   ADL Screening (condition at time of admission) Independently performs ADLs?: Yes (appropriate for developmental age) Is the patient deaf or have difficulty hearing?: No Does the patient have difficulty seeing, even when wearing glasses/contacts?: No Does the patient have difficulty concentrating, remembering, or making decisions?: Yes  Permission Sought/Granted                  Emotional Assessment              Admission diagnosis:  Hypokalemia [E87.6] Gastroenteritis [K52.9] Hyponatremia [E87.1] Hypothermia, initial encounter [T68.XXXA] Nausea and vomiting, unspecified vomiting type [R11.2] Patient Active Problem List   Diagnosis Date Noted   Gastroenteritis 05/14/2024   Fall at home, initial encounter 05/14/2024   Chronic systolic CHF (congestive heart failure) (HCC) 05/14/2024   Hypokalemia 05/14/2024   Leukocytosis 05/14/2024   Hypothermia 05/14/2024   Seizure (HCC) 02/11/2023   Thrombocytopenia 09/19/2021   Hyponatremia 09/19/2021   Hypocalcemia 09/19/2021   Polyp of sigmoid colon    Gastric erosion    GI bleeding 09/18/2021   Elevated INR 09/18/2021   Elevated lactic acid level 09/18/2021   Alcohol abuse 09/16/2021   Alcoholic gastritis 09/16/2021   Left foot drop  08/16/2021   Abnormal weight loss 08/16/2021   Essential hypertension 12/28/2020   Pelvic pain in female 12/28/2020   Vomiting without nausea 12/28/2020   Elevated liver function tests 12/28/2020   Gastroesophageal reflux disease 12/28/2020   Tobacco use disorder 12/28/2020   Amenorrhea 12/28/2020   PCP:  [facility]:   [facility] [phone] [place], [place] - [address] AT [facility] OF [address] [place] [address] Phone: [phone] Fax: [phone]     Social Drivers of Health (SDOH) Social History: SDOH Screenings   Food Insecurity: No Food Insecurity (05/14/2024)  Housing: Low Risk  (05/14/2024)  Transportation Needs: No Transportation Needs (05/14/2024)  Utilities: Not At Risk (05/14/2024)  Alcohol Screen: Low Risk  (08/16/2021)  Depression (PHQ2-9): Medium Risk (08/16/2021)  Financial Resource Strain: Medium Risk (09/10/2023)   [facility] from [facility]  Tobacco Use: High Risk (05/13/2024)  Health Literacy: Medium Risk (01/15/2022)   Received from [facility]   SDOH Interventions:     Readmission Risk Interventions     No data to display

## 2024-05-18 NOTE — Progress Notes (Signed)
 [facility]  ROUNDING NOTE   Subjective:   Patient resting in bed Alert No family present States her appetite remains low  Sodium 128  Objective:  Vital signs in last 24 hours:  Temp:  [97.4 F (36.3 C)-98.1 F (36.7 C)] 98.1 F (36.7 C) (10/08 0900) Pulse Rate:  [68-78] 73 (10/08 0900) Resp:  [16-20] 20 (10/08 0900) BP: (114-122)/(60-80) 114/60 (10/08 0900) SpO2:  [97 %-100 %] 97 % (10/08 0900) Weight:  [62.2 kg] 62.2 kg (10/08 0500)  Weight change:  Filed Weights   05/14/24 0129 05/15/24 0500 05/18/24 0500  Weight: 62.3 kg 61.8 kg 62.2 kg    Intake/Output: I/O last 3 completed shifts: In: 360 [P.O.:360] Out: -    Intake/Output this shift:  Total I/O In: -  Out: 800 [Urine:800]  Physical Exam: General: NAD, withdrawn  Head: Normocephalic, atraumatic. Moist oral mucosal membranes  Eyes: Anicteric  Lungs:  Clear to auscultation, normal effort  Heart: Regular rate and rhythm  Abdomen:  Soft, nontender  Extremities:  No peripheral edema.  Neurologic: Awake, alert  Skin: Warm,dry, no rash       Basic Metabolic Panel: Recent Labs  Lab 05/14/24 0537 05/14/24 1156 05/15/24 1958 05/16/24 0441 05/16/24 1231 05/17/24 0439 05/17/24 1954 05/18/24 0433  NA 120*   < > 122* 123* 124* 125* 127* 128*  K 3.7   < > 3.2* 3.4*  --  2.7* 3.6 3.3*  CL 93*   < > 92* 95*  --  97* 97* 100  CO2 19*   < > 20* 21*  --  21* 23 22  GLUCOSE 80   < > 106* 91  --  74 71 72  BUN 18   < > 13 14  --  13 12 13   CREATININE 0.60   < > 0.69 0.64  --  0.55 0.64 0.46  CALCIUM  7.6*   < > 8.1* 8.0*  --  7.8* 7.9* 7.9*  MG 2.2  --  2.5*  --   --   --  2.3 2.3  PHOS 1.5*  --   --   --   --   --   --  3.2   < > = values in this interval not displayed.    Liver Function Tests: Recent Labs  Lab 05/13/24 2155  AST 37  ALT 25  ALKPHOS 62  BILITOT 0.6  PROT 6.2*  ALBUMIN 2.9*   No results for input(s): LIPASE, AMYLASE in the last 168 hours. No results for  input(s): AMMONIA in the last 168 hours.  CBC: Recent Labs  Lab 05/13/24 2155 05/14/24 1156 05/15/24 1958 05/16/24 0440 05/16/24 1231 05/17/24 0439  WBC 14.1* 17.2* 24.0* 23.6* 20.4* 14.4*  NEUTROABS 11.0*  --   --  19.5*  --   --   HGB 12.8 11.7* 11.6* 12.5 11.3* 11.2*  HCT 36.1 31.5* 31.2* 33.7* RESULTS UNAVAILABLE DUE TO INTERFERING SUBSTANCE RESULTS UNAVAILABLE DUE TO INTERFERING SUBSTANCE  MCV 93.0 87.5 88.1 88.5 RESULTS UNAVAILABLE DUE TO INTERFERING SUBSTANCE RESULTS UNAVAILABLE DUE TO INTERFERING SUBSTANCE  PLT PLATELET CLUMPS NOTED ON SMEAR, UNABLE TO ESTIMATE 104* 96* 107* 95* 84*    Cardiac Enzymes: No results for input(s): CKTOTAL, CKMB, CKMBINDEX, TROPONINI in the last 168 hours.  BNP: Invalid input(s): POCBNP  CBG: No results for input(s): GLUCAP in the last 168 hours.  Microbiology: Results for orders placed or performed during the hospital encounter of 05/13/24  Culture, blood (Routine X 2) w Reflex to  ID Panel     Status: None (Preliminary result)   Collection Time: 05/13/24  9:55 PM   Specimen: BLOOD  Result Value Ref Range Status   Specimen Description BLOOD BLOOD RIGHT HAND  Final   Special Requests   Final    BOTTLES DRAWN AEROBIC ONLY Blood Culture results may not be optimal due to an inadequate volume of blood received in culture bottles   Culture   Final    NO GROWTH 4 DAYS Performed at [facility], [address], [place], [place] [address]    Report Status PENDING  Incomplete  Culture, blood (Routine X 2) w Reflex to ID Panel     Status: None (Preliminary result)   Collection Time: 05/13/24  9:55 PM   Specimen: BLOOD  Result Value Ref Range Status   Specimen Description BLOOD BLOOD RIGHT WRIST  Final   Special Requests   Final    BOTTLES DRAWN AEROBIC AND ANAEROBIC Blood Culture results may not be optimal due to an inadequate volume of blood received in culture bottles   Culture   Final    NO GROWTH 4 DAYS Performed  at [facility], [address], [place], [place] [address]    Report Status PENDING  Incomplete  Urine Culture     Status: Abnormal   Collection Time: 05/14/24  1:53 AM   Specimen: Urine, Random  Result Value Ref Range Status   Specimen Description   Final    URINE, RANDOM Performed at [facility], [address] [place], [place] [address]    Special Requests   Final    NONE Reflexed from [id number] Performed at [facility], [address] [place], [place] [address]    Culture >=100,000 COLONIES/mL ESCHERICHIA COLI (A)  Final   Report Status 05/16/2024 FINAL  Final   Organism ID, Bacteria ESCHERICHIA COLI (A)  Final      Susceptibility   Escherichia coli - MIC*    AMPICILLIN <=2 SENSITIVE Sensitive     CEFAZOLIN (URINE) Value in next row Sensitive      2 SENSITIVEThis is a modified FDA-approved test that has been validated and its performance characteristics determined by the reporting laboratory.  This laboratory is certified under the Clinical Laboratory Improvement Amendments CLIA as qualified to perform high complexity clinical laboratory testing.    CEFEPIME Value in next row Sensitive      2 SENSITIVEThis is a modified FDA-approved test that has been validated and its performance characteristics determined by the reporting laboratory.  This laboratory is certified under the Clinical Laboratory Improvement Amendments CLIA as qualified to perform high complexity clinical laboratory testing.    ERTAPENEM Value in next row Sensitive      2 SENSITIVEThis is a modified FDA-approved test that has been validated and its performance characteristics determined by the reporting laboratory.  This laboratory is certified under the Clinical Laboratory Improvement Amendments CLIA as qualified to perform high complexity clinical laboratory testing.    CEFTRIAXONE Value in next row Sensitive      2 SENSITIVEThis is a modified FDA-approved test that has been validated  and its performance characteristics determined by the reporting laboratory.  This laboratory is certified under the Clinical Laboratory Improvement Amendments CLIA as qualified to perform high complexity clinical laboratory testing.    CIPROFLOXACIN Value in next row Sensitive      2 SENSITIVEThis is a modified FDA-approved test that has been validated and its performance characteristics determined by the reporting  laboratory.  This laboratory is certified under the Clinical Laboratory Improvement Amendments CLIA as qualified to perform high complexity clinical laboratory testing.    GENTAMICIN Value in next row Sensitive      2 SENSITIVEThis is a modified FDA-approved test that has been validated and its performance characteristics determined by the reporting laboratory.  This laboratory is certified under the Clinical Laboratory Improvement Amendments CLIA as qualified to perform high complexity clinical laboratory testing.    NITROFURANTOIN Value in next row Sensitive      2 SENSITIVEThis is a modified FDA-approved test that has been validated and its performance characteristics determined by the reporting laboratory.  This laboratory is certified under the Clinical Laboratory Improvement Amendments CLIA as qualified to perform high complexity clinical laboratory testing.    TRIMETH/SULFA Value in next row Sensitive      2 SENSITIVEThis is a modified FDA-approved test that has been validated and its performance characteristics determined by the reporting laboratory.  This laboratory is certified under the Clinical Laboratory Improvement Amendments CLIA as qualified to perform high complexity clinical laboratory testing.    AMPICILLIN/SULBACTAM Value in next row Sensitive      2 SENSITIVEThis is a modified FDA-approved test that has been validated and its performance characteristics determined by the reporting laboratory.  This laboratory is certified under the Clinical Laboratory Improvement Amendments  CLIA as qualified to perform high complexity clinical laboratory testing.    PIP/TAZO Value in next row Sensitive      <=4 SENSITIVEThis is a modified FDA-approved test that has been validated and its performance characteristics determined by the reporting laboratory.  This laboratory is certified under the Clinical Laboratory Improvement Amendments CLIA as qualified to perform high complexity clinical laboratory testing.    MEROPENEM Value in next row Sensitive      <=4 SENSITIVEThis is a modified FDA-approved test that has been validated and its performance characteristics determined by the reporting laboratory.  This laboratory is certified under the Clinical Laboratory Improvement Amendments CLIA as qualified to perform high complexity clinical laboratory testing.    * >=100,000 COLONIES/mL ESCHERICHIA COLI  Respiratory (~20 pathogens) panel by PCR     Status: None   Collection Time: 05/16/24  1:30 PM   Specimen: Nasopharyngeal Swab; Respiratory  Result Value Ref Range Status   Adenovirus NOT DETECTED NOT DETECTED Final   Coronavirus 229E NOT DETECTED NOT DETECTED Final    Comment: (NOTE) The Coronavirus on the Respiratory Panel, DOES NOT test for the novel  Coronavirus (2019 nCoV)    Coronavirus HKU1 NOT DETECTED NOT DETECTED Final   Coronavirus NL63 NOT DETECTED NOT DETECTED Final   Coronavirus OC43 NOT DETECTED NOT DETECTED Final   Metapneumovirus NOT DETECTED NOT DETECTED Final   Rhinovirus / Enterovirus NOT DETECTED NOT DETECTED Final   Influenza A NOT DETECTED NOT DETECTED Final   Influenza B NOT DETECTED NOT DETECTED Final   Parainfluenza Virus 1 NOT DETECTED NOT DETECTED Final   Parainfluenza Virus 2 NOT DETECTED NOT DETECTED Final   Parainfluenza Virus 3 NOT DETECTED NOT DETECTED Final   Parainfluenza Virus 4 NOT DETECTED NOT DETECTED Final   Respiratory Syncytial Virus NOT DETECTED NOT DETECTED Final   Bordetella pertussis NOT DETECTED NOT DETECTED Final   Bordetella  Parapertussis NOT DETECTED NOT DETECTED Final   Chlamydophila pneumoniae NOT DETECTED NOT DETECTED Final   Mycoplasma pneumoniae NOT DETECTED NOT DETECTED Final    Comment: Performed at [facility], [address] [place], [place] [address]  Resp panel by RT-PCR (RSV, Flu A&B, Covid) Anterior Nasal Swab     Status: None   Collection Time: 05/16/24  1:30 PM   Specimen: Anterior Nasal Swab  Result Value Ref Range Status   SARS Coronavirus 2 by RT PCR NEGATIVE NEGATIVE Final    Comment: (NOTE) SARS-CoV-2 target nucleic acids are NOT DETECTED.  The SARS-CoV-2 RNA is generally detectable in upper respiratory specimens during the acute phase of infection. The lowest concentration of SARS-CoV-2 viral copies this assay can detect is 138 copies/mL. A negative result does not preclude SARS-Cov-2 infection and should not be used as the sole basis for treatment or other patient management decisions. A negative result may occur with  improper specimen collection/handling, submission of specimen other than nasopharyngeal swab, presence of viral mutation(s) within the areas targeted by this assay, and inadequate number of viral copies(<138 copies/mL). A negative result must be combined with clinical observations, patient history, and epidemiological information. The expected result is Negative.  Fact Sheet for Patients:  BloggerCourse.com  Fact Sheet for Healthcare Providers:  SeriousBroker.it  This test is no t yet approved or cleared by the [place]  FDA and  has been authorized for detection and/or diagnosis of SARS-CoV-2 by FDA under an Emergency Use Authorization (EUA). This EUA will remain  in effect (meaning this test can be used) for the duration of the COVID-19 declaration under Section 564(b)(1) of the Act, 21 U.S.C.section 360bbb-3(b)(1), unless the authorization is terminated  or revoked sooner.       Influenza A by  PCR NEGATIVE NEGATIVE Final   Influenza B by PCR NEGATIVE NEGATIVE Final    Comment: (NOTE) The Xpert Xpress SARS-CoV-2/FLU/RSV plus assay is intended as an aid in the diagnosis of influenza from Nasopharyngeal swab specimens and should not be used as a sole basis for treatment. Nasal washings and aspirates are unacceptable for Xpert Xpress SARS-CoV-2/FLU/RSV testing.  Fact Sheet for Patients: BloggerCourse.com  Fact Sheet for Healthcare Providers: SeriousBroker.it  This test is not yet approved or cleared by the [place]  FDA and has been authorized for detection and/or diagnosis of SARS-CoV-2 by FDA under an Emergency Use Authorization (EUA). This EUA will remain in effect (meaning this test can be used) for the duration of the COVID-19 declaration under Section 564(b)(1) of the Act, 21 U.S.C. section 360bbb-3(b)(1), unless the authorization is terminated or revoked.     Resp Syncytial Virus by PCR NEGATIVE NEGATIVE Final    Comment: (NOTE) Fact Sheet for Patients: BloggerCourse.com  Fact Sheet for Healthcare Providers: SeriousBroker.it  This test is not yet approved or cleared by the [place]  FDA and has been authorized for detection and/or diagnosis of SARS-CoV-2 by FDA under an Emergency Use Authorization (EUA). This EUA will remain in effect (meaning this test can be used) for the duration of the COVID-19 declaration under Section 564(b)(1) of the Act, 21 U.S.C. section 360bbb-3(b)(1), unless the authorization is terminated or revoked.  Performed at [facility], [address] [place], [place] [address]     Coagulation Studies: No results for input(s): LABPROT, INR in the last 72 hours.  Urinalysis: No results for input(s): COLORURINE, LABSPEC, PHURINE, GLUCOSEU, HGBUR, BILIRUBINUR, KETONESUR, PROTEINUR, UROBILINOGEN,  NITRITE, LEUKOCYTESUR in the last 72 hours.  Invalid input(s): APPERANCEUR     Imaging: US  ABDOMEN LIMITED WITH LIVER DOPPLER Result Date: 05/18/2024 CLINICAL DATA:  43 year old female with history of low serum albumin. EXAM: DUPLEX ULTRASOUND OF LIVER TECHNIQUE: Color and duplex Doppler ultrasound was performed to  evaluate the hepatic in-flow and out-flow vessels. COMPARISON:  09/16/2021 FINDINGS: Liver: Diffuse mildly increased echogenicity. Normal hepatic contour without nodularity. No focal lesion, mass or intrahepatic biliary ductal dilatation. Main Portal Vein size: 1.0 cm Portal Vein Velocities (all antegrade) Main Prox:  77 cm/sec Main Mid: 42 cm/sec Main Dist:  45 cm/sec Right: 36 cm/sec Left: 15 cm/sec Hepatic Vein Velocities Right:  29 cm/sec Middle:  22 cm/sec Left:  21 cm/sec IVC: Present and patent with normal respiratory phasicity. Hepatic Artery Velocity:  126 cm/sec Splenic Vein Velocity:  33 cm/sec Spleen: 11.0 cm x 5.8 cm x a 10.8 cm with a total volume of 342 cm^3 (411 cm^3 is upper limit normal) Portal Vein Occlusion/Thrombus: No Splenic Vein Occlusion/Thrombus: No Ascites: None Varices: None IMPRESSION: 1. Mild hepatic steatosis. 2. Patent hepatic vasculature with normal directions of flow. Ester Sides, MD [facility] Radiology Electronically Signed   By: Ester Sides M.D.   On: 05/18/2024 08:00     Medications:    cefTRIAXone (ROCEPHIN)  IV 1 g (05/17/24 1305)    cloBAZam   15 mg Oral BID   divalproex   1,000 mg Oral BID   enoxaparin (LOVENOX) injection  40 mg Subcutaneous Q24H   feeding supplement  237 mL Oral TID BM   folic acid   1 mg Oral Daily   lacosamide   200 mg Oral BID   multivitamin with minerals  1 tablet Oral Daily   nicotine   21 mg Transdermal Daily   potassium chloride   40 mEq Oral Q4H   sodium chloride   1 g Oral BID WC   thiamine   100 mg Oral Daily   Or   thiamine   100 mg Intravenous Daily    acetaminophen , hydrALAZINE, LORazepam , ondansetron  (ZOFRAN ) IV  Assessment/ Plan:  Ms. Gabriela White is a 43 y.o.  female with past medical conditions including GERD, anemia, seizures, thrombocytopenia, tobacco and alcohol abuse, hypertension, and chronic hyponatremia, who was admitted to [facility] on 05/13/2024 for Hypokalemia [E87.6] Gastroenteritis [K52.9] Hyponatremia [E87.1] Hypothermia, initial encounter [T68.XXXA] Nausea and vomiting, unspecified vomiting type [R11.2]   Acute on chronic hyponatremia.  Patient appears chronically low, baseline appears to be 129-132.  Admitted with as sodium 122, decreased to 117.  Likely secondary to beer [place]oto mania with decreased solute intake.  Presenting symptoms more concerning for alcohol withdrawal. Patient encouraged to diversify meals.   Sodium 128 today. Patient encouraged to monitor and limit fluid intake to 32oz daily and increase solute intake.   Patient cleared to discharge from renal stance. Will defer to primary team    LOS: 4 Jillisa Harris 10/8/20252:08 PM

## 2024-05-18 NOTE — Progress Notes (Signed)
 PHARMACY CONSULT NOTE - FOLLOW UP  Pharmacy Consult for Electrolyte Monitoring and Replacement   Recent Labs: Potassium (mmol/L)  Date Value  05/18/2024 3.3 (L)   Magnesium  (mg/dL)  Date Value  89/91/7974 2.3   Calcium  (mg/dL)  Date Value  89/91/7974 7.9 (L)   Albumin (g/dL)  Date Value  89/96/7974 2.9 (L)  08/16/2021 3.9   Phosphorus (mg/dL)  Date Value  89/91/7974 3.2   Sodium (mmol/L)  Date Value  05/18/2024 128 (L)  01/02/2021 135   Corrected Ca:  8.8   (Ca 7.9  alb 2.9)  Assessment: 43 yo F admitted w/ Nausea, vomiting, diarrhea, and fall, hyponatremia, Hx ETOH, seizures, anemia, GIB,  thrombocytopenia, hyponatremia.  Cannabinoid (+)UDS   Goal of Therapy:  Electrolytes WNL   Plan:  - Kcl 40mEq PO x 2 - Recheck all electrolytes tomorrow with morning labs   Janyah Singleterry A Rula [place] ,PharmD Clinical Pharmacist 05/18/2024 7:35 AM

## 2024-05-18 NOTE — Progress Notes (Signed)
 [facility] Progress Note  Patient: Gabriela White    FMW:[id number]  DOA: 05/13/2024     Date of Service: the patient was seen and examined on 05/18/2024  Chief Complaint  Patient presents with   Diarrhea        Nausea   Brief hospital course: Shikita Vaillancourt is a 43 y.o. female with medical history significant of seizure, hypertension, GERD, tobacco abuse, alcohol abuse, anemia, GI bleeding, thrombocytopenia, hyponatremia, who presents with nausea, vomiting, diarrhea, and fall.    10/03: to ED, hyponatremia worse from baseline. UA concerning for UTI  10/04: admit to hospitalist. Nausea resolved, no BM. Of note reports typically she does not eat very much and has trouble adding salt to foods, just doesn't want to eat anything. On chart review longstanding hyponatremia. Sodium down as low as 117 here so no plans for dc at this time.  10/05: remains low Na. Nephrology consult - likely cause baseline poor diet and EtOH. TSH WNL. Cortisol high not low.   10/06: remain low Na but somewhat improved. WBC trending up - has been on rocephin for UTI though no symptoms. Smokers cough but no SOB, no HA, no nausea, still poor appetite but she states about her normal, no abd pain, no fever. Question viral illness as cause for presenting symptoms and sodium may just be at baseline but will work up for respiratory infection / lung pathology  may contribute to SIADH?  10/07: resp w/u non-revealing and WBC improving. 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 Na 120 on admission, Na128 gradually improved Continue fluid striction 1.2 L/day Continue sodium chloride  1 g p.o. Ultrasound liver negative for any findings of  cirrhosis Nephrology consulted 10/8 slightly confused, we will check ammonia level and recheck sodium level tomorrow a.m.   Nausea, resolved Leukocytosis - improving  Cough, attributed to smokers cough  No headache, no rash, no urinary or GI complaints to point to infection source  --> CXR and resp viral panel no concerns but still suspect possible viral illness    # UTI Urine culture growing E. Coli, pansensitive Continue ceftriaxone 1 g IV daily for 5 days    Hypothermia - resolved --> monitor vitals    Cannabinoid (+)UDS ?hyperemesis syndrome but nausea is not a chronic issue for her  --> avoid use recreational substances     Patient with seizure activity outside of alcohol use 06/27/2022.  Witnessed by husband who described generalized rhythmic shaking of all limbs when she is asleep. Patient follows with [facility] neurology and NSG. Currently prescribed Depakote , Vimpat , and Onfi . Neurology to manage seizure medications. S/p vagal nerve stimulator implanted 08/2023, improved seizures.  Home meds restarted all po  --> Depakote  1000 mg BID --> Vimpat  200 mg BID --> Onfi  20 mg BID    Weakness, generalized --> PT/OT to see    Body mass index is 25.08 kg/m.  Interventions:  Diet: Regular diet DVT Prophylaxis: Subcutaneous Lovenox   Advance goals of care discussion: Full code  Family Communication: family was not present at bedside, at the time of interview.  The pt provided permission to discuss medical plan with the family. Opportunity was given to ask question and all questions were  answered satisfactorily.  10/8 d/w her husband over the phone, possible DC plan tomorrow a.m.   Disposition:  Pt is from Home, admitted with hyponatremia, still has low sodium, which precludes a safe discharge. Discharge to home, when stable, most likely in 1 to 2 days.  Subjective: No significant events overnight, patient denies any nausea vomiting or diarrhea. Patient said that she would  like to go home, patient was advised that I will talk to her husband and then we will make DC plan, she does not look stable to me, slightly confused.  Physical Exam: General: NAD, lying comfortably, slightly confused and disoriented Appear in no distress, affect appropriate Eyes: PERRLA ENT: Oral Mucosa Clear, moist  Neck: no JVD,  Cardiovascular: S1 and S2 Present, no Murmur,  Respiratory: good respiratory effort, Bilateral Air entry equal and Decreased, no Crackles, no wheezes Abdomen: Bowel Sound present, Soft and no tenderness,  Skin: no rashes Extremities: no Pedal edema, no calf tenderness Neurologic: without any new focal findings Gait not checked due to patient safety concerns  Vitals:   05/17/24 2049 05/18/24 0255 05/18/24 0500 05/18/24 0900  BP: 116/71 114/70  114/60  Pulse: 68 68  73  Resp: 16 20  20   Temp: (!) 97.5 F (36.4 C) (!) 97.4 F (36.3 C)  98.1 F (36.7 C)  TempSrc:    Oral  SpO2: 99% 100%  97%  Weight:   62.2 kg   Height:        Intake/Output Summary (Last 24 hours) at 05/18/2024 1411 Last data filed at 05/18/2024 9061 Gross per 24 hour  Intake 120 ml  Output 800 ml  Net -680 ml   Filed Weights   05/14/24 0129 05/15/24 0500 05/18/24 0500  Weight: 62.3 kg 61.8 kg 62.2 kg    Data Reviewed: I have personally reviewed and interpreted daily labs, tele strips, imagings as discussed above. I reviewed all nursing notes, pharmacy notes, vitals, pertinent old records I have discussed plan of care as described above with RN and patient/family.  CBC: Recent Labs  Lab 05/13/24 2155 05/14/24 1156 05/15/24 1958 05/16/24 0440 05/16/24 1231 05/17/24 0439  WBC 14.1* 17.2* 24.0* 23.6* 20.4* 14.4*  NEUTROABS 11.0*  --   --  19.5*  --   --   HGB 12.8 11.7* 11.6* 12.5 11.3* 11.2*  HCT 36.1 31.5* 31.2* 33.7* RESULTS UNAVAILABLE DUE TO INTERFERING SUBSTANCE RESULTS UNAVAILABLE DUE TO INTERFERING SUBSTANCE  MCV 93.0 87.5 88.1 88.5 RESULTS UNAVAILABLE DUE TO  INTERFERING SUBSTANCE RESULTS UNAVAILABLE DUE TO INTERFERING SUBSTANCE  PLT PLATELET CLUMPS NOTED ON SMEAR, UNABLE TO ESTIMATE 104* 96* 107* 95* 84*   Basic Metabolic Panel: Recent Labs  Lab 05/14/24 0537 05/14/24 1156 05/15/24 1958 05/16/24 0441 05/16/24 1231 05/17/24 0439 05/17/24 1954 05/18/24 0433  NA 120*   < > 122* 123* 124* 125* 127* 128*  K 3.7   < > 3.2* 3.4*  --  2.7* 3.6 3.3*  CL 93*   < > 92* 95*  --  97* 97* 100  CO2 19*   < > 20* 21*  --  21* 23 22  GLUCOSE 80   < > 106* 91  --  74 71 72  BUN 18   < > 13 14  --  13 12 13   CREATININE 0.60   < > 0.69 0.64  --  0.55 0.64 0.46  CALCIUM  7.6*   < > 8.1* 8.0*  --  7.8* 7.9* 7.9*  MG 2.2  --  2.5*  --   --   --  2.3 2.3  PHOS 1.5*  --   --   --   --   --   --  3.2   < > = values in this interval not displayed.    Studies: No results found.  Scheduled Meds:  cloBAZam   15 mg Oral BID   divalproex   1,000 mg Oral BID   enoxaparin (LOVENOX) injection  40 mg Subcutaneous Q24H   feeding supplement  237 mL Oral TID BM   folic acid   1 mg Oral Daily   lacosamide   200 mg Oral BID   multivitamin with minerals  1 tablet Oral Daily   nicotine   21 mg Transdermal Daily   potassium chloride   40 mEq Oral Q4H   sodium chloride   1 g Oral BID WC   thiamine   100 mg Oral Daily   Or   thiamine   100 mg Intravenous Daily   Continuous Infusions:  cefTRIAXone (ROCEPHIN)  IV 1 g (05/17/24 1305)   PRN Meds: acetaminophen , hydrALAZINE, LORazepam , ondansetron  (ZOFRAN ) IV  Time spent: 35 minutes  Author: ELVAN SOR. MD [facility] 05/18/2024 2:11 PM  To reach On-call, see care teams to locate the attending and reach out to them via [email or web address]. If 7PM-7AM, please contact night-coverage If you still have difficulty reaching the attending provider, please page the [facility] (Director on Call) for [facility] on amion for assistance.

## 2024-05-18 NOTE — Progress Notes (Signed)
 Occupational Therapy Treatment Patient Details Name: Gabriela White MRN: [id number] DOB: 01-01-1981 Today's Date: 05/18/2024   History of present illness 43 y.o. female with medical history significant of seizure, hypertension, GERD, tobacco abuse, alcohol abuse, anemia, GI bleeding, thrombocytopenia, hyponatremia, who presents with nausea, vomiting, diarrhea, and fall.   OT comments  Pt seen for OT treatment on this date. Upon arrival to room pt supine in bed with HOB elevated, agreeable to tx. Pt requires increased time for single step direction following and CGA to SBA for functional transfers.  Completed grooming tasks that included functional reaching at countertop with pt requiring CGA to SBA.  Pt required +2 verbal cues for safety awareness during task engagement specifically when attempting to transfer to EOB from standing without safe placement of self. Pt making good progress toward goals, will continue to follow POC. Discharge recommendation remains appropriate.        If plan is discharge home, recommend the following:  A little help with walking and/or transfers;A little help with bathing/dressing/bathroom;Assistance with feeding;Assistance with cooking/housework;Direct supervision/assist for medications management;Direct supervision/assist for financial management;Assist for transportation;Help with stairs or ramp for entrance;Supervision due to cognitive status   Equipment Recommendations  None recommended by OT    Recommendations for Other Services      Precautions / Restrictions Precautions Precautions: Fall Recall of Precautions/Restrictions: Intact Restrictions Weight Bearing Restrictions Per Provider Order: No       Mobility Bed Mobility Overal bed mobility: Needs Assistance Bed Mobility: Supine to Sit, Sit to Supine     Supine to sit: Supervision Sit to supine: Supervision        Transfers Overall transfer level: Needs assistance Equipment used:  Rolling walker (2 wheels) Transfers: Sit to/from Stand Sit to Stand: Supervision     Step pivot transfers: Contact guard assist, Supervision     General transfer comment: Verbal cuing required for overall safety awareness when attempting to sit prematurely     Balance Overall balance assessment: Needs assistance Sitting-balance support: Feet supported Sitting balance-Leahy Scale: Good     Standing balance support: Single extremity supported, During functional activity Standing balance-Leahy Scale: Fair Standing balance comment: functional reaching to items at countertop during standing grooming/hygiene tasks                           ADL either performed or assessed with clinical judgement   ADL Overall ADL's : Needs assistance/impaired     Grooming: Wash/dry face;Oral care;Standing;Contact guard assist;Supervision/safety Grooming Details (indicate cue type and reason): CGA/SBA in standing                                    Extremity/Trunk Assessment Upper Extremity Assessment Upper Extremity Assessment: Generalized weakness            Vision Patient Visual Report: No change from baseline     Perception     Praxis     Communication Communication Communication: No apparent difficulties   Cognition Arousal: Alert Behavior During Therapy: Flat affect Cognition: Cognition impaired                               Following commands: Impaired Following commands impaired: Follows one step commands with increased time      Cueing   Cueing Techniques: Verbal cues, Visual cues  Exercises Other Exercises  Other Exercises: Education provided on safety awareness in order to reduce risk of falls/LOB.    Shoulder Instructions       General Comments      Pertinent Vitals/ Pain       Pain Assessment Pain Assessment: No/denies pain  Home Living                                          Prior  Functioning/Environment              Frequency  Min 2X/week        Progress Toward Goals  OT Goals(current goals can now be found in the care plan section)  Progress towards OT goals: Progressing toward goals  Acute Rehab OT Goals Potential to Achieve Goals: Fair  Plan      Co-evaluation                 AM-PAC OT 6 Clicks Daily Activity     Outcome Measure   Help from another person eating meals?: None Help from another person taking care of personal grooming?: None Help from another person toileting, which includes using toliet, bedpan, or urinal?: A Little Help from another person bathing (including washing, rinsing, drying)?: A Little Help from another person to put on and taking off regular upper body clothing?: A Little Help from another person to put on and taking off regular lower body clothing?: A Little 6 Click Score: 20    End of Session Equipment Utilized During Treatment: Rolling walker (2 wheels)  OT Visit Diagnosis: Unsteadiness on feet (R26.81);Repeated falls (R29.6);Muscle weakness (generalized) (M62.81)   Activity Tolerance Patient tolerated treatment well   Patient Left in bed;with call bell/phone within reach;Other (comment) (Dietary staff present and RN at entrance of door)   Nurse Communication Mobility status        Time: 8693-8673 OT Time Calculation (min): 20 min  Charges: OT General Charges $OT Visit: 1 Visit OT Treatments $Self Care/Home Management : 8-22 mins  Harlene Sharps OTR/L   Harlene LITTIE Sharps 05/18/2024, 1:42 PM

## 2024-05-18 NOTE — Progress Notes (Signed)
 Mobility Specialist - Progress Note   05/18/24 1100  Mobility  Activity Ambulated with assistance  Level of Assistance Contact guard assist, steadying assist  Assistive Device Front wheel walker  Distance Ambulated (ft) 170 ft  Range of Motion/Exercises Active  Activity Response Tolerated well  Mobility visit 1 Mobility  Mobility Specialist Start Time (ACUTE ONLY) 1056  Mobility Specialist Stop Time (ACUTE ONLY) 1128  Mobility Specialist Time Calculation (min) (ACUTE ONLY) 32 min   Pt was supine in bed upon entry. Pt agreed to mobility. Pt was able to EOB independently with no AD. Pt was able to STS independently with 2 WW. Pt had a BM at [facility] before mobility. Pt ambulated well. Pt during mobility needed continuous cues for direction. Pt had at times decrease in balance strength, but was able to re correct during ambulation. After activity pt felt fine and was back in bed with all needs in reach.  Clem Rodes Mobility Specialist 05/18/24, 11:42 AM

## 2024-05-19 ENCOUNTER — Other Ambulatory Visit: Payer: Self-pay

## 2024-05-19 DIAGNOSIS — K529 Noninfective gastroenteritis and colitis, unspecified: Secondary | ICD-10-CM | POA: Diagnosis not present

## 2024-05-19 LAB — CBC
HCT: 29.8 % — ABNORMAL LOW (ref 36.0–46.0)
Hemoglobin: 10.9 g/dL — ABNORMAL LOW (ref 12.0–15.0)
MCH: 32.6 pg (ref 26.0–34.0)
MCHC: 36.6 g/dL — ABNORMAL HIGH (ref 30.0–36.0)
MCV: 89.2 fL (ref 80.0–100.0)
Platelets: 117 K/uL — ABNORMAL LOW (ref 150–400)
RBC: 3.34 MIL/uL — ABNORMAL LOW (ref 3.87–5.11)
RDW: 14.7 % (ref 11.5–15.5)
WBC: 12.5 K/uL — ABNORMAL HIGH (ref 4.0–10.5)
nRBC: 0 % (ref 0.0–0.2)

## 2024-05-19 LAB — BASIC METABOLIC PANEL WITH GFR
Anion gap: 7 (ref 5–15)
BUN: 12 mg/dL (ref 6–20)
CO2: 21 mmol/L — ABNORMAL LOW (ref 22–32)
Calcium: 7.8 mg/dL — ABNORMAL LOW (ref 8.9–10.3)
Chloride: 100 mmol/L (ref 98–111)
Creatinine, Ser: 0.57 mg/dL (ref 0.44–1.00)
GFR, Estimated: >60 mL/min (ref >60)
Glucose, Bld: 76 mg/dL (ref 70–99)
Potassium: 3.7 mmol/L (ref 3.5–5.1)
Sodium: 128 mmol/L — ABNORMAL LOW (ref 135–145)

## 2024-05-19 LAB — CULTURE, BLOOD (ROUTINE X 2)
Culture: NO GROWTH
Culture: NO GROWTH

## 2024-05-19 LAB — AMMONIA: Ammonia: 34 umol/L (ref 9–35)

## 2024-05-19 LAB — PHOSPHORUS: Phosphorus: 3.1 mg/dL (ref 2.5–4.6)

## 2024-05-19 LAB — MAGNESIUM: Magnesium: 2.2 mg/dL (ref 1.7–2.4)

## 2024-05-19 MED ORDER — SODIUM CHLORIDE 1 G PO TABS
1.0000 g | ORAL_TABLET | Freq: Two times a day (BID) | ORAL | 0 refills | Status: AC
  Filled 2024-05-19: qty 14, 7d supply, fill #0

## 2024-05-19 MED ORDER — ACETAMINOPHEN 325 MG PO TABS: 650.0000 mg | ORAL_TABLET | Freq: Three times a day (TID) | ORAL | Status: AC | PRN

## 2024-05-19 MED ORDER — VITAMIN B-1 100 MG PO TABS
100.0000 mg | ORAL_TABLET | Freq: Every day | ORAL | 0 refills | Status: AC
  Filled 2024-05-19: qty 30, 30d supply, fill #0

## 2024-05-19 NOTE — Plan of Care (Signed)

## 2024-05-19 NOTE — Progress Notes (Signed)
 [facility]  ROUNDING NOTE   Subjective:   Sitting up in bed More alert and assertive today Appetite remains poor  Sodium remains 128  Objective:  Vital signs in last 24 hours:  Temp:  [97.5 F (36.4 C)-98.4 F (36.9 C)] 98.1 F (36.7 C) (10/09 0319) Pulse Rate:  [66-70] 66 (10/09 0319) Resp:  [16-20] 16 (10/09 0319) BP: (109-123)/(70-82) 123/76 (10/09 0319) SpO2:  [96 %-97 %] 97 % (10/09 0319) Weight:  [62.5 kg] 62.5 kg (10/09 0441)  Weight change: 0.3 kg Filed Weights   05/15/24 0500 05/18/24 0500 05/19/24 0441  Weight: 61.8 kg 62.2 kg 62.5 kg    Intake/Output: I/O last 3 completed shifts: In: 520 [P.O.:120; IV Piggyback:400] Out: 800 [Urine:800]   Intake/Output this shift:  No intake/output data recorded.  Physical Exam: General: NAD  Head: Normocephalic, atraumatic. Moist oral mucosal membranes  Eyes: Anicteric  Lungs:  Clear to auscultation, normal effort  Heart: Regular rate and rhythm  Abdomen:  Soft, nontender  Extremities:  No peripheral edema.  Neurologic: Awake, alert  Skin: Warm,dry, no rash       Basic Metabolic Panel: Recent Labs  Lab 05/14/24 0537 05/14/24 1156 05/15/24 1958 05/16/24 0441 05/16/24 1231 05/17/24 0439 05/17/24 1954 05/18/24 0433 05/19/24 0425  NA 120*   < > 122* 123* 124* 125* 127* 128* 128*  K 3.7   < > 3.2* 3.4*  --  2.7* 3.6 3.3* 3.7  CL 93*   < > 92* 95*  --  97* 97* 100 100  CO2 19*   < > 20* 21*  --  21* 23 22 21*  GLUCOSE 80   < > 106* 91  --  74 71 72 76  BUN 18   < > 13 14  --  13 12 13 12   CREATININE 0.60   < > 0.69 0.64  --  0.55 0.64 0.46 0.57  CALCIUM  7.6*   < > 8.1* 8.0*  --  7.8* 7.9* 7.9* 7.8*  MG 2.2  --  2.5*  --   --   --  2.3 2.3 2.2  PHOS 1.5*  --   --   --   --   --   --  3.2 3.1   < > = values in this interval not displayed.    Liver Function Tests: Recent Labs  Lab 05/13/24 2155  AST 37  ALT 25  ALKPHOS 62  BILITOT 0.6  PROT 6.2*  ALBUMIN 2.9*   No results for  input(s): LIPASE, AMYLASE in the last 168 hours. Recent Labs  Lab 05/19/24 0425  AMMONIA 34    CBC: Recent Labs  Lab 05/13/24 2155 05/14/24 1156 05/15/24 1958 05/16/24 0440 05/16/24 1231 05/17/24 0439 05/19/24 0425  WBC 14.1*   < > 24.0* 23.6* 20.4* 14.4* 12.5*  NEUTROABS 11.0*  --   --  19.5*  --   --   --   HGB 12.8   < > 11.6* 12.5 11.3* 11.2* 10.9*  HCT 36.1   < > 31.2* 33.7* RESULTS UNAVAILABLE DUE TO INTERFERING SUBSTANCE RESULTS UNAVAILABLE DUE TO INTERFERING SUBSTANCE 29.8*  MCV 93.0   < > 88.1 88.5 RESULTS UNAVAILABLE DUE TO INTERFERING SUBSTANCE RESULTS UNAVAILABLE DUE TO INTERFERING SUBSTANCE 89.2  PLT PLATELET CLUMPS NOTED ON SMEAR, UNABLE TO ESTIMATE   < > 96* 107* 95* 84* 117*   < > = values in this interval not displayed.    Cardiac Enzymes: No results for input(s): CKTOTAL, CKMB,  CKMBINDEX, TROPONINI in the last 168 hours.  BNP: Invalid input(s): POCBNP  CBG: No results for input(s): GLUCAP in the last 168 hours.  Microbiology: Results for orders placed or performed during the hospital encounter of 05/13/24  Culture, blood (Routine X 2) w Reflex to ID Panel     Status: None   Collection Time: 05/13/24  9:55 PM   Specimen: BLOOD  Result Value Ref Range Status   Specimen Description BLOOD BLOOD RIGHT HAND  Final   Special Requests   Final    BOTTLES DRAWN AEROBIC ONLY Blood Culture results may not be optimal due to an inadequate volume of blood received in culture bottles   Culture   Final    NO GROWTH 5 DAYS Performed at [facility], [address] [place], [place] [address]    Report Status 05/19/2024 FINAL  Final  Culture, blood (Routine X 2) w Reflex to ID Panel     Status: None   Collection Time: 05/13/24  9:55 PM   Specimen: BLOOD  Result Value Ref Range Status   Specimen Description BLOOD BLOOD RIGHT WRIST  Final   Special Requests   Final    BOTTLES DRAWN AEROBIC AND ANAEROBIC Blood Culture results may not be  optimal due to an inadequate volume of blood received in culture bottles   Culture   Final    NO GROWTH 5 DAYS Performed at [facility], [address] [place], [place] [address]    Report Status 05/19/2024 FINAL  Final  Urine Culture     Status: Abnormal   Collection Time: 05/14/24  1:53 AM   Specimen: Urine, Random  Result Value Ref Range Status   Specimen Description   Final    URINE, RANDOM Performed at [facility], [address] [place], [place] [address]    Special Requests   Final    NONE Reflexed from [id number] Performed at [facility], [address] [place], [place] [address]    Culture >=100,000 COLONIES/mL ESCHERICHIA COLI (A)  Final   Report Status 05/16/2024 FINAL  Final   Organism ID, Bacteria ESCHERICHIA COLI (A)  Final      Susceptibility   Escherichia coli - MIC*    AMPICILLIN <=2 SENSITIVE Sensitive     CEFAZOLIN (URINE) Value in next row Sensitive      2 SENSITIVEThis is a modified FDA-approved test that has been validated and its performance characteristics determined by the reporting laboratory.  This laboratory is certified under the Clinical Laboratory Improvement Amendments CLIA as qualified to perform high complexity clinical laboratory testing.    CEFEPIME Value in next row Sensitive      2 SENSITIVEThis is a modified FDA-approved test that has been validated and its performance characteristics determined by the reporting laboratory.  This laboratory is certified under the Clinical Laboratory Improvement Amendments CLIA as qualified to perform high complexity clinical laboratory testing.    ERTAPENEM Value in next row Sensitive      2 SENSITIVEThis is a modified FDA-approved test that has been validated and its performance characteristics determined by the reporting laboratory.  This laboratory is certified under the Clinical Laboratory Improvement Amendments CLIA as qualified to perform high complexity clinical laboratory  testing.    CEFTRIAXONE Value in next row Sensitive      2 SENSITIVEThis is a modified FDA-approved test that has been validated and its performance characteristics determined by the reporting laboratory.  This laboratory is certified under the Clinical Laboratory  Improvement Amendments CLIA as qualified to perform high complexity clinical laboratory testing.    CIPROFLOXACIN Value in next row Sensitive      2 SENSITIVEThis is a modified FDA-approved test that has been validated and its performance characteristics determined by the reporting laboratory.  This laboratory is certified under the Clinical Laboratory Improvement Amendments CLIA as qualified to perform high complexity clinical laboratory testing.    GENTAMICIN Value in next row Sensitive      2 SENSITIVEThis is a modified FDA-approved test that has been validated and its performance characteristics determined by the reporting laboratory.  This laboratory is certified under the Clinical Laboratory Improvement Amendments CLIA as qualified to perform high complexity clinical laboratory testing.    NITROFURANTOIN Value in next row Sensitive      2 SENSITIVEThis is a modified FDA-approved test that has been validated and its performance characteristics determined by the reporting laboratory.  This laboratory is certified under the Clinical Laboratory Improvement Amendments CLIA as qualified to perform high complexity clinical laboratory testing.    TRIMETH/SULFA Value in next row Sensitive      2 SENSITIVEThis is a modified FDA-approved test that has been validated and its performance characteristics determined by the reporting laboratory.  This laboratory is certified under the Clinical Laboratory Improvement Amendments CLIA as qualified to perform high complexity clinical laboratory testing.    AMPICILLIN/SULBACTAM Value in next row Sensitive      2 SENSITIVEThis is a modified FDA-approved test that has been validated and its performance  characteristics determined by the reporting laboratory.  This laboratory is certified under the Clinical Laboratory Improvement Amendments CLIA as qualified to perform high complexity clinical laboratory testing.    PIP/TAZO Value in next row Sensitive      <=4 SENSITIVEThis is a modified FDA-approved test that has been validated and its performance characteristics determined by the reporting laboratory.  This laboratory is certified under the Clinical Laboratory Improvement Amendments CLIA as qualified to perform high complexity clinical laboratory testing.    MEROPENEM Value in next row Sensitive      <=4 SENSITIVEThis is a modified FDA-approved test that has been validated and its performance characteristics determined by the reporting laboratory.  This laboratory is certified under the Clinical Laboratory Improvement Amendments CLIA as qualified to perform high complexity clinical laboratory testing.    * >=100,000 COLONIES/mL ESCHERICHIA COLI  Respiratory (~20 pathogens) panel by PCR     Status: None   Collection Time: 05/16/24  1:30 PM   Specimen: Nasopharyngeal Swab; Respiratory  Result Value Ref Range Status   Adenovirus NOT DETECTED NOT DETECTED Final   Coronavirus 229E NOT DETECTED NOT DETECTED Final    Comment: (NOTE) The Coronavirus on the Respiratory Panel, DOES NOT test for the novel  Coronavirus (2019 nCoV)    Coronavirus HKU1 NOT DETECTED NOT DETECTED Final   Coronavirus NL63 NOT DETECTED NOT DETECTED Final   Coronavirus OC43 NOT DETECTED NOT DETECTED Final   Metapneumovirus NOT DETECTED NOT DETECTED Final   Rhinovirus / Enterovirus NOT DETECTED NOT DETECTED Final   Influenza A NOT DETECTED NOT DETECTED Final   Influenza B NOT DETECTED NOT DETECTED Final   Parainfluenza Virus 1 NOT DETECTED NOT DETECTED Final   Parainfluenza Virus 2 NOT DETECTED NOT DETECTED Final   Parainfluenza Virus 3 NOT DETECTED NOT DETECTED Final   Parainfluenza Virus 4 NOT DETECTED NOT DETECTED Final    Respiratory Syncytial Virus NOT DETECTED NOT DETECTED Final   Bordetella pertussis NOT DETECTED NOT DETECTED  Final   Bordetella Parapertussis NOT DETECTED NOT DETECTED Final   Chlamydophila pneumoniae NOT DETECTED NOT DETECTED Final   Mycoplasma pneumoniae NOT DETECTED NOT DETECTED Final    Comment: Performed at [facility], [address] [place], [place] [address]  Resp panel by RT-PCR (RSV, Flu A&B, Covid) Anterior Nasal Swab     Status: None   Collection Time: 05/16/24  1:30 PM   Specimen: Anterior Nasal Swab  Result Value Ref Range Status   SARS Coronavirus 2 by RT PCR NEGATIVE NEGATIVE Final    Comment: (NOTE) SARS-CoV-2 target nucleic acids are NOT DETECTED.  The SARS-CoV-2 RNA is generally detectable in upper respiratory specimens during the acute phase of infection. The lowest concentration of SARS-CoV-2 viral copies this assay can detect is 138 copies/mL. A negative result does not preclude SARS-Cov-2 infection and should not be used as the sole basis for treatment or other patient management decisions. A negative result may occur with  improper specimen collection/handling, submission of specimen other than nasopharyngeal swab, presence of viral mutation(s) within the areas targeted by this assay, and inadequate number of viral copies(<138 copies/mL). A negative result must be combined with clinical observations, patient history, and epidemiological information. The expected result is Negative.  Fact Sheet for Patients:  BloggerCourse.com  Fact Sheet for Healthcare Providers:  SeriousBroker.it  This test is no t yet approved or cleared by the [place]  FDA and  has been authorized for detection and/or diagnosis of SARS-CoV-2 by FDA under an Emergency Use Authorization (EUA). This EUA will remain  in effect (meaning this test can be used) for the duration of the COVID-19 declaration under Section 564(b)(1)  of the Act, 21 U.S.C.section 360bbb-3(b)(1), unless the authorization is terminated  or revoked sooner.       Influenza A by PCR NEGATIVE NEGATIVE Final   Influenza B by PCR NEGATIVE NEGATIVE Final    Comment: (NOTE) The Xpert Xpress SARS-CoV-2/FLU/RSV plus assay is intended as an aid in the diagnosis of influenza from Nasopharyngeal swab specimens and should not be used as a sole basis for treatment. Nasal washings and aspirates are unacceptable for Xpert Xpress SARS-CoV-2/FLU/RSV testing.  Fact Sheet for Patients: BloggerCourse.com  Fact Sheet for Healthcare Providers: SeriousBroker.it  This test is not yet approved or cleared by the [place]  FDA and has been authorized for detection and/or diagnosis of SARS-CoV-2 by FDA under an Emergency Use Authorization (EUA). This EUA will remain in effect (meaning this test can be used) for the duration of the COVID-19 declaration under Section 564(b)(1) of the Act, 21 U.S.C. section 360bbb-3(b)(1), unless the authorization is terminated or revoked.     Resp Syncytial Virus by PCR NEGATIVE NEGATIVE Final    Comment: (NOTE) Fact Sheet for Patients: BloggerCourse.com  Fact Sheet for Healthcare Providers: SeriousBroker.it  This test is not yet approved or cleared by the [place]  FDA and has been authorized for detection and/or diagnosis of SARS-CoV-2 by FDA under an Emergency Use Authorization (EUA). This EUA will remain in effect (meaning this test can be used) for the duration of the COVID-19 declaration under Section 564(b)(1) of the Act, 21 U.S.C. section 360bbb-3(b)(1), unless the authorization is terminated or revoked.  Performed at [facility], [address] [place], [place] [address]     Coagulation Studies: No results for input(s): LABPROT, INR in the last 72 hours.  Urinalysis: No results  for input(s): COLORURINE, LABSPEC, PHURINE, GLUCOSEU, HGBUR, BILIRUBINUR, KETONESUR, PROTEINUR, UROBILINOGEN, NITRITE, LEUKOCYTESUR in the  last 72 hours.  Invalid input(s): APPERANCEUR     Imaging: No results found.    Medications:      cloBAZam   15 mg Oral BID   divalproex   1,000 mg Oral BID   enoxaparin (LOVENOX) injection  40 mg Subcutaneous Q24H   feeding supplement  237 mL Oral TID BM   folic acid   1 mg Oral Daily   lacosamide   200 mg Oral BID   multivitamin with minerals  1 tablet Oral Daily   nicotine   21 mg Transdermal Daily   sodium chloride   1 g Oral BID WC   thiamine   100 mg Oral Daily   Or   thiamine   100 mg Intravenous Daily   acetaminophen , hydrALAZINE, LORazepam , ondansetron  (ZOFRAN ) IV  Assessment/ Plan:  Gabriela White is a 43 y.o.  female with past medical conditions including GERD, anemia, seizures, thrombocytopenia, tobacco and alcohol abuse, hypertension, and chronic hyponatremia, who was admitted to [facility] on 05/13/2024 for Hypokalemia [E87.6] Gastroenteritis [K52.9] Hyponatremia [E87.1] Hypothermia, initial encounter [T68.XXXA] Nausea and vomiting, unspecified vomiting type [R11.2]   Acute on chronic hyponatremia.  Patient appears chronically low, baseline appears to be 129-132.  Admitted with as sodium 122, decreased to 117.  Likely secondary to beer Poto mania with decreased solute intake.  Presenting symptoms more concerning for alcohol withdrawal. Patient encouraged to diversify meals.   Sodium remains 128. Continue to encourage  fluid intake and increase solute intake.   Patient cleared to discharge from renal stance. Will defer to primary team. Will sign off at this time.    LOS: 5 Jermya Dowding 10/9/202512:34 PM

## 2024-05-19 NOTE — Plan of Care (Signed)

## 2024-05-19 NOTE — Progress Notes (Signed)
 PHARMACY CONSULT NOTE - FOLLOW UP  Pharmacy Consult for Electrolyte Monitoring and Replacement   Recent Labs: Potassium (mmol/L)  Date Value  05/19/2024 3.7   Magnesium  (mg/dL)  Date Value  89/90/7974 2.2   Calcium  (mg/dL)  Date Value  89/90/7974 7.8 (L)   Albumin (g/dL)  Date Value  89/96/7974 2.9 (L)  08/16/2021 3.9   Phosphorus (mg/dL)  Date Value  89/90/7974 3.1   Sodium (mmol/L)  Date Value  05/19/2024 128 (L)  01/02/2021 135   Corrected Ca:  8.8    Assessment: 43 yo F admitted w/ Nausea, vomiting, diarrhea, and fall, hyponatremia, Hx ETOH, seizures, anemia, GIB,  thrombocytopenia, hyponatremia.  Cannabinoid (+)UDS   Goal of Therapy:  Electrolytes WNL   Plan:  - No replacement warranted at this time - Recheck all electrolytes tomorrow with morning labs   Estill CHRISTELLA Lutes, PharmD, BCPS Clinical Pharmacist 05/19/2024 7:04 AM

## 2024-05-19 NOTE — Discharge Summary (Signed)
 [facility] Discharge Summary   Patient: Gabriela White FMW:[id number]  PCP: [facility]  Date of admission: 05/13/2024   Date of discharge:  05/19/2024     Discharge Diagnoses:  Principal Problem:   Gastroenteritis Active Problems:   Hyponatremia   Hypokalemia   Seizure (HCC)   Fall at home, initial encounter   Essential hypertension   Chronic systolic CHF (congestive heart failure) (HCC)   Thrombocytopenia   Leukocytosis   Hypothermia   Tobacco use disorder   Alcohol abuse   Admitted From: Home Disposition:  Home   Recommendations for Outpatient Follow-up:  Follow-up with PCP in 1 week, repeat BMP after 1 week Follow-up with nephrology in 1 week Follow up LABS/TEST: BMP in 1 week   Follow-up Information     [facility] Follow up in 1 week(s).   Contact information: [address] [place] [address] [phone]         Dennise Capri, MD Follow up in 1 week(s).   Specialty: Nephrology Contact information: [address] [place] [address] [phone]                Diet recommendation: Regular diet  Activity: The patient is advised to gradually reintroduce usual activities, as tolerated  Discharge Condition: stable  Code Status: Full code   History of present illness: As per the H and P dictated on admission.  Hospital Course:  Gabriela White is a 43 y.o. female with medical history significant of seizure, hypertension, GERD, tobacco abuse, alcohol abuse, anemia, GI bleeding, thrombocytopenia, hyponatremia, who presents with nausea, vomiting, diarrhea, and fall.    10/03: to ED, hyponatremia worse from baseline. UA concerning for UTI  10/04: admit to hospitalist. Nausea resolved, no BM. Of note reports typically she does not eat very much and has trouble adding salt to foods, just doesn't want to eat anything. On chart review longstanding hyponatremia. Sodium down as low as  117 here so no plans for dc at this time.  10/05: remains low Na. Nephrology consult - likely cause baseline poor diet and EtOH. TSH WNL. Cortisol high not low.   10/06: remain low Na but somewhat improved. WBC trending up - has been on rocephin for UTI though no symptoms. Smokers cough but no SOB, no HA, no nausea, still poor appetite but she states about her normal, no abd pain, no fever. Question viral illness as cause for presenting symptoms and sodium may just be at baseline but will work up for respiratory infection / lung pathology  may contribute to SIADH?  10/07: resp w/u non-revealing and WBC improving. 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 Na 120 on admission, Na128 gradually improved Continue fluid striction 1.2 L/day Continue sodium chloride  1 g p.o. Ultrasound liver negative for any findings of cirrhosis Nephrology consulted 10/8 slightly confused, we will check ammonia level and recheck sodium level tomorrow a.m. 10/9 patient was sleepy today, discussed with patient's husband, he seems satisfied with her mental status and agreed for the discharge planning.  Recommended to follow with PCP and repeat BMP after 1 week.  Continue fluid striction and beer restriction.  Continue salt tablets as prescribed.  # Nausea, resolved # Leukocytosis - improving  # Cough, attributed to smokers cough  No headache, no rash, no urinary or GI complaints to point to infection source  --> CXR and resp viral panel no concerns but still suspect possible viral illness    # UTI: Urine culture growing E. Coli, pansensitive. S/p Ceftriaxone 1 g IV daily for 5 days.  Completed course   # Hypothermia - resolved  # Cannabinoid (+)UDS ?hyperemesis syndrome but nausea is not a chronic issue for her.   Counseling done to avoid use recreational substances     # Patient with seizure activity outside of alcohol use 06/27/2022.  Witnessed by husband who described generalized rhythmic shaking of all limbs when she is asleep. Patient follows with [facility] neurology and NSG. Currently prescribed Depakote , Vimpat , and Onfi . Neurology to manage seizure medications. S/p vagal nerve stimulator implanted 08/2023, improved seizures.  Home meds restarted all po  --> Depakote  1000 mg BID --> Vimpat  200 mg BID --> Onfi  20 mg BID     # Weakness, generalized: PT and OT evaluation done, recommended no need of services.   Body mass index is 25.2 kg/m.  Nutrition Interventions:   - Patient was instructed, not to drive, operate heavy machinery, perform activities at heights, swimming or participation in water activities or provide baby sitting services while on Pain, Sleep and Anxiety Medications; until her outpatient Physician has advised to do so again.  - Also recommended to not to take more than prescribed Pain, Sleep and Anxiety Medications.  Patient was seen by physical therapy, who recommended no therapy needed on discharge. On the day of the discharge the patient's vitals were stable, and no other acute medical condition were reported by patient. the patient was felt safe to be discharge at Home.  Consultants: Nephrology Procedures: None  Discharge Exam: General: Appear in no distress, Oral Mucosa Clear, moist. Cardiovascular: S1 and S2 Present, no Murmur, Respiratory: normal respiratory effort, Bilateral Air entry present and no Crackles, no wheezes Abdomen: Bowel Sound present, Soft and no tenderness. Extremities: no Pedal edema, no calf tenderness Neurology: alert and oriented to time, place, and person affect appropriate.  Filed Weights   05/15/24 0500 05/18/24 0500 05/19/24 0441  Weight: 61.8 kg 62.2 kg 62.5 kg   Vitals:   05/18/24 2115 05/19/24 0319  BP: 111/82 123/76  Pulse: 70 66   Resp: 20 16  Temp: 98.4 F (36.9 C) 98.1 F (36.7 C)  SpO2: 96% 97%    DISCHARGE MEDICATION: Allergies as of 05/19/2024       Reactions   Influenza Virus Vaccine Hives   Morphine Nausea And Vomiting        Medication List     TAKE these medications    acetaminophen  325 MG tablet Commonly known as: TYLENOL  Take 2 tablets (650 mg total) by mouth every 8 (eight) hours as needed for headache, fever or mild pain (pain score 1-3). What changed:  medication strength how much to take when to take this reasons to take this   carvedilol 3.125 MG tablet Commonly known as: COREG Take 3.125 mg by mouth 2 (two) times daily with a meal.   cloBAZam  10 MG tablet Commonly known as: ONFI  Take 15 mg by mouth 2 (two) times daily.   divalproex  500 MG DR tablet Commonly known as: DEPAKOTE  Take 1,000 mg by mouth 2 (two) times daily.   ibuprofen 200 MG tablet Commonly known as: ADVIL Take 200 mg by mouth 2 (two) times daily.   lacosamide  200 MG Tabs tablet Commonly known as: VIMPAT  Take 200 mg  by mouth 2 (two) times daily.   multivitamin with minerals Tabs tablet Take 1 tablet by mouth daily.   Nayzilam  5 MG/0.1ML Soln Generic drug: Midazolam  Place 5 mg into both nostrils as directed. Give 1 spray (5mg ) into 1 nostril. If no response in 10 min, may give second spray in other nostril. Do not give second dose if patient has trouble breathing or excessive sedation. Max 2 doses/seizure episode, 1 episode every 3 days or 5 episodes/month   sodium chloride  1 g tablet Take 1 tablet (1 g total) by mouth 2 (two) times daily with a meal for 7 days.   thiamine  100 MG tablet Commonly known as: Vitamin B-1 Take 1 tablet (100 mg total) by mouth daily. Start taking on: May 20, 2024       Allergies  Allergen Reactions   Influenza Virus Vaccine Hives   Morphine Nausea And Vomiting   Discharge Instructions     Call MD for:  difficulty breathing, headache or visual disturbances    Complete by: As directed    Call MD for:  extreme fatigue   Complete by: As directed    Call MD for:  persistant dizziness or light-headedness   Complete by: As directed    Call MD for:  persistant nausea and vomiting   Complete by: As directed    Call MD for:  severe uncontrolled pain   Complete by: As directed    Call MD for:  temperature >100.4   Complete by: As directed    Diet general   Complete by: As directed    Discharge instructions   Complete by: As directed    Follow-up with PCP in 1 week, repeat BMP after 1 week Follow-up with nephrology in 1 week   Increase activity slowly   Complete by: As directed        The results of significant diagnostics from this hospitalization (including imaging, microbiology, ancillary and laboratory) are listed below for reference.    Significant Diagnostic Studies: US  ABDOMEN LIMITED WITH LIVER DOPPLER Result Date: 05/18/2024 CLINICAL DATA:  43 year old female with history of low serum albumin. EXAM: DUPLEX ULTRASOUND OF LIVER TECHNIQUE: Color and duplex Doppler ultrasound was performed to evaluate the hepatic in-flow and out-flow vessels. COMPARISON:  09/16/2021 FINDINGS: Liver: Diffuse mildly increased echogenicity. Normal hepatic contour without nodularity. No focal lesion, mass or intrahepatic biliary ductal dilatation. Main Portal Vein size: 1.0 cm Portal Vein Velocities (all antegrade) Main Prox:  77 cm/sec Main Mid: 42 cm/sec Main Dist:  45 cm/sec Right: 36 cm/sec Left: 15 cm/sec Hepatic Vein Velocities Right:  29 cm/sec Middle:  22 cm/sec Left:  21 cm/sec IVC: Present and patent with normal respiratory phasicity. Hepatic Artery Velocity:  126 cm/sec Splenic Vein Velocity:  33 cm/sec Spleen: 11.0 cm x 5.8 cm x a 10.8 cm with a total volume of 342 cm^3 (411 cm^3 is upper limit normal) Portal Vein Occlusion/Thrombus: No Splenic Vein Occlusion/Thrombus: No Ascites: None Varices: None IMPRESSION: 1. Mild hepatic steatosis. 2. Patent hepatic  vasculature with normal directions of flow. Ester Sides, MD [facility] Radiology Electronically Signed   By: Ester Sides M.D.   On: 05/18/2024 08:00   DG Chest Port 1 View Result Date: 05/16/2024 CLINICAL DATA:  10031 Cough 10031 100030 Leukocytosis 100030 EXAM: PORTABLE CHEST 1 VIEW COMPARISON:  February 11, 2023 FINDINGS: The cardiomediastinal silhouette is unchanged in contour.LEFT neck stimulator device. No pleural effusion. No pneumothorax. Linear opacity in the LEFT lower lung  most consistent with atelectasis. IMPRESSION: Linear opacity in the LEFT lower lung most consistent with atelectasis. Electronically Signed   By: Corean Salter M.D.   On: 05/16/2024 16:18   CT HEAD WO CONTRAST ( ) Result Date: 05/14/2024 EXAM: CT HEAD WITHOUT CONTRAST 05/14/2024 02:10:36 AM TECHNIQUE: CT of the head was performed without the administration of intravenous contrast. Automated exposure control, iterative reconstruction, and/or weight based adjustment of the mA/kV was utilized to reduce the radiation dose to as low as reasonably achievable. COMPARISON: 02/11/2023 CLINICAL HISTORY: Head trauma, moderate-severe. Nausea, vomiting, and diarrhea for 24 hours. Multiple episodes where the patient seemed post-ictal, though no witnessed seizure. Chills (no known fevers) and generalized weakness. Decreased PO intake, 20 lbs weight loss in recent months. FINDINGS: BRAIN AND VENTRICLES: No acute hemorrhage, evidence of acute infarct, hydrocephalus, or extra-axial collection. No mass effect or midline shift. ORBITS: No acute abnormality. SINUSES: No acute abnormality. SOFT TISSUES AND SKULL: No acute soft tissue abnormality or skull fracture. IMPRESSION: 1. No acute intracranial abnormality Electronically signed by: Franky Stanford MD 05/14/2024 03:55 AM EDT RP Workstation: [id number]    Microbiology: Recent Results (from the past 240 hours)  Culture, blood (Routine X 2) w  Reflex to ID Panel     Status: None   Collection Time: 05/13/24  9:55 PM   Specimen: BLOOD  Result Value Ref Range Status   Specimen Description BLOOD BLOOD RIGHT HAND  Final   Special Requests   Final    BOTTLES DRAWN AEROBIC ONLY Blood Culture results may not be optimal due to an inadequate volume of blood received in culture bottles   Culture   Final    NO GROWTH 5 DAYS Performed at [facility], [address] [place], [place] [address]    Report Status 05/19/2024 FINAL  Final  Culture, blood (Routine X 2) w Reflex to ID Panel     Status: None   Collection Time: 05/13/24  9:55 PM   Specimen: BLOOD  Result Value Ref Range Status   Specimen Description BLOOD BLOOD RIGHT WRIST  Final   Special Requests   Final    BOTTLES DRAWN AEROBIC AND ANAEROBIC Blood Culture results may not be optimal due to an inadequate volume of blood received in culture bottles   Culture   Final    NO GROWTH 5 DAYS Performed at [facility], [address], [place], [place] [address]    Report Status 05/19/2024 FINAL  Final  Urine Culture     Status: Abnormal   Collection Time: 05/14/24  1:53 AM   Specimen: Urine, Random  Result Value Ref Range Status   Specimen Description   Final    URINE, RANDOM Performed at [facility], [address] [place], [place] [address]    Special Requests   Final    NONE Reflexed from [id number] Performed at [facility], [address] [place], [place] [address]    Culture >=100,000 COLONIES/mL ESCHERICHIA COLI (A)  Final   Report Status 05/16/2024 FINAL  Final   Organism ID, Bacteria ESCHERICHIA COLI (A)  Final      Susceptibility   Escherichia coli - MIC*    AMPICILLIN <=2 SENSITIVE Sensitive     CEFAZOLIN (URINE) Value in next row Sensitive      2 SENSITIVEThis is a modified FDA-approved test that has been validated and its performance characteristics determined by the reporting laboratory.  This laboratory is certified under  the Clinical Laboratory Improvement Amendments CLIA as  qualified to perform high complexity clinical laboratory testing.    CEFEPIME Value in next row Sensitive      2 SENSITIVEThis is a modified FDA-approved test that has been validated and its performance characteristics determined by the reporting laboratory.  This laboratory is certified under the Clinical Laboratory Improvement Amendments CLIA as qualified to perform high complexity clinical laboratory testing.    ERTAPENEM Value in next row Sensitive      2 SENSITIVEThis is a modified FDA-approved test that has been validated and its performance characteristics determined by the reporting laboratory.  This laboratory is certified under the Clinical Laboratory Improvement Amendments CLIA as qualified to perform high complexity clinical laboratory testing.    CEFTRIAXONE Value in next row Sensitive      2 SENSITIVEThis is a modified FDA-approved test that has been validated and its performance characteristics determined by the reporting laboratory.  This laboratory is certified under the Clinical Laboratory Improvement Amendments CLIA as qualified to perform high complexity clinical laboratory testing.    CIPROFLOXACIN Value in next row Sensitive      2 SENSITIVEThis is a modified FDA-approved test that has been validated and its performance characteristics determined by the reporting laboratory.  This laboratory is certified under the Clinical Laboratory Improvement Amendments CLIA as qualified to perform high complexity clinical laboratory testing.    GENTAMICIN Value in next row Sensitive      2 SENSITIVEThis is a modified FDA-approved test that has been validated and its performance characteristics determined by the reporting laboratory.  This laboratory is certified under the Clinical Laboratory Improvement Amendments CLIA as qualified to perform high complexity clinical laboratory testing.    NITROFURANTOIN Value in next row Sensitive      2  SENSITIVEThis is a modified FDA-approved test that has been validated and its performance characteristics determined by the reporting laboratory.  This laboratory is certified under the Clinical Laboratory Improvement Amendments CLIA as qualified to perform high complexity clinical laboratory testing.    TRIMETH/SULFA Value in next row Sensitive      2 SENSITIVEThis is a modified FDA-approved test that has been validated and its performance characteristics determined by the reporting laboratory.  This laboratory is certified under the Clinical Laboratory Improvement Amendments CLIA as qualified to perform high complexity clinical laboratory testing.    AMPICILLIN/SULBACTAM Value in next row Sensitive      2 SENSITIVEThis is a modified FDA-approved test that has been validated and its performance characteristics determined by the reporting laboratory.  This laboratory is certified under the Clinical Laboratory Improvement Amendments CLIA as qualified to perform high complexity clinical laboratory testing.    PIP/TAZO Value in next row Sensitive      <=4 SENSITIVEThis is a modified FDA-approved test that has been validated and its performance characteristics determined by the reporting laboratory.  This laboratory is certified under the Clinical Laboratory Improvement Amendments CLIA as qualified to perform high complexity clinical laboratory testing.    MEROPENEM Value in next row Sensitive      <=4 SENSITIVEThis is a modified FDA-approved test that has been validated and its performance characteristics determined by the reporting laboratory.  This laboratory is certified under the Clinical Laboratory Improvement Amendments CLIA as qualified to perform high complexity clinical laboratory testing.    * >=100,000 COLONIES/mL ESCHERICHIA COLI  Respiratory (~20 pathogens) panel by PCR     Status: None   Collection Time: 05/16/24  1:30 PM   Specimen: Nasopharyngeal Swab; Respiratory  Result Value Ref Range  Status   Adenovirus NOT DETECTED NOT DETECTED Final   Coronavirus 229E NOT DETECTED NOT DETECTED Final    Comment: (NOTE) The Coronavirus on the Respiratory Panel, DOES NOT test for the novel  Coronavirus (2019 nCoV)    Coronavirus HKU1 NOT DETECTED NOT DETECTED Final   Coronavirus NL63 NOT DETECTED NOT DETECTED Final   Coronavirus OC43 NOT DETECTED NOT DETECTED Final   Metapneumovirus NOT DETECTED NOT DETECTED Final   Rhinovirus / Enterovirus NOT DETECTED NOT DETECTED Final   Influenza A NOT DETECTED NOT DETECTED Final   Influenza B NOT DETECTED NOT DETECTED Final   Parainfluenza Virus 1 NOT DETECTED NOT DETECTED Final   Parainfluenza Virus 2 NOT DETECTED NOT DETECTED Final   Parainfluenza Virus 3 NOT DETECTED NOT DETECTED Final   Parainfluenza Virus 4 NOT DETECTED NOT DETECTED Final   Respiratory Syncytial Virus NOT DETECTED NOT DETECTED Final   Bordetella pertussis NOT DETECTED NOT DETECTED Final   Bordetella Parapertussis NOT DETECTED NOT DETECTED Final   Chlamydophila pneumoniae NOT DETECTED NOT DETECTED Final   Mycoplasma pneumoniae NOT DETECTED NOT DETECTED Final    Comment: Performed at [facility], [address], [place], [place] [address]  Resp panel by RT-PCR (RSV, Flu A&B, Covid) Anterior Nasal Swab     Status: None   Collection Time: 05/16/24  1:30 PM   Specimen: Anterior Nasal Swab  Result Value Ref Range Status   SARS Coronavirus 2 by RT PCR NEGATIVE NEGATIVE Final    Comment: (NOTE) SARS-CoV-2 target nucleic acids are NOT DETECTED.  The SARS-CoV-2 RNA is generally detectable in upper respiratory specimens during the acute phase of infection. The lowest concentration of SARS-CoV-2 viral copies this assay can detect is 138 copies/mL. A negative result does not preclude SARS-Cov-2 infection and should not be used as the sole basis for treatment or other patient management decisions. A negative result may occur with  improper specimen collection/handling,  submission of specimen other than nasopharyngeal swab, presence of viral mutation(s) within the areas targeted by this assay, and inadequate number of viral copies(<138 copies/mL). A negative result must be combined with clinical observations, patient history, and epidemiological information. The expected result is Negative.  Fact Sheet for Patients:  BloggerCourse.com  Fact Sheet for Healthcare Providers:  SeriousBroker.it  This test is no t yet approved or cleared by the [place]  FDA and  has been authorized for detection and/or diagnosis of SARS-CoV-2 by FDA under an Emergency Use Authorization (EUA). This EUA will remain  in effect (meaning this test can be used) for the duration of the COVID-19 declaration under Section 564(b)(1) of the Act, 21 U.S.C.section 360bbb-3(b)(1), unless the authorization is terminated  or revoked sooner.       Influenza A by PCR NEGATIVE NEGATIVE Final   Influenza B by PCR NEGATIVE NEGATIVE Final    Comment: (NOTE) The Xpert Xpress SARS-CoV-2/FLU/RSV plus assay is intended as an aid in the diagnosis of influenza from Nasopharyngeal swab specimens and should not be used as a sole basis for treatment. Nasal washings and aspirates are unacceptable for Xpert Xpress SARS-CoV-2/FLU/RSV testing.  Fact Sheet for Patients: BloggerCourse.com  Fact Sheet for Healthcare Providers: SeriousBroker.it  This test is not yet approved or cleared by the [place]  FDA and has been authorized for detection and/or diagnosis of SARS-CoV-2 by FDA under an Emergency Use Authorization (EUA). This EUA will remain in effect (meaning this test can be used) for the duration of the COVID-19 declaration under Section 564(b)(1)  of the Act, 21 U.S.C. section 360bbb-3(b)(1), unless the authorization is terminated or revoked.     Resp Syncytial Virus by PCR NEGATIVE  NEGATIVE Final    Comment: (NOTE) Fact Sheet for Patients: BloggerCourse.com  Fact Sheet for Healthcare Providers: SeriousBroker.it  This test is not yet approved or cleared by the [place]  FDA and has been authorized for detection and/or diagnosis of SARS-CoV-2 by FDA under an Emergency Use Authorization (EUA). This EUA will remain in effect (meaning this test can be used) for the duration of the COVID-19 declaration under Section 564(b)(1) of the Act, 21 U.S.C. section 360bbb-3(b)(1), unless the authorization is terminated or revoked.  Performed at [facility], [address] [place], [place] [address]      Labs: CBC: Recent Labs  Lab 05/13/24 2155 05/14/24 1156 05/15/24 1958 05/16/24 0440 05/16/24 1231 05/17/24 0439 05/19/24 0425  WBC 14.1*   < > 24.0* 23.6* 20.4* 14.4* 12.5*  NEUTROABS 11.0*  --   --  19.5*  --   --   --   HGB 12.8   < > 11.6* 12.5 11.3* 11.2* 10.9*  HCT 36.1   < > 31.2* 33.7* RESULTS UNAVAILABLE DUE TO INTERFERING SUBSTANCE RESULTS UNAVAILABLE DUE TO INTERFERING SUBSTANCE 29.8*  MCV 93.0   < > 88.1 88.5 RESULTS UNAVAILABLE DUE TO INTERFERING SUBSTANCE RESULTS UNAVAILABLE DUE TO INTERFERING SUBSTANCE 89.2  PLT PLATELET CLUMPS NOTED ON SMEAR, UNABLE TO ESTIMATE   < > 96* 107* 95* 84* 117*   < > = values in this interval not displayed.   Basic Metabolic Panel: Recent Labs  Lab 05/14/24 0537 05/14/24 1156 05/15/24 1958 05/16/24 0441 05/16/24 1231 05/17/24 0439 05/17/24 1954 05/18/24 0433 05/19/24 0425  NA 120*   < > 122* 123* 124* 125* 127* 128* 128*  K 3.7   < > 3.2* 3.4*  --  2.7* 3.6 3.3* 3.7  CL 93*   < > 92* 95*  --  97* 97* 100 100  CO2 19*   < > 20* 21*  --  21* 23 22 21*  GLUCOSE 80   < > 106* 91  --  74 71 72 76  BUN 18   < > 13 14  --  13 12 13 12   CREATININE 0.60   < > 0.69 0.64  --  0.55 0.64 0.46 0.57  CALCIUM  7.6*   < > 8.1* 8.0*  --  7.8* 7.9* 7.9* 7.8*  MG  2.2  --  2.5*  --   --   --  2.3 2.3 2.2  PHOS 1.5*  --   --   --   --   --   --  3.2 3.1   < > = values in this interval not displayed.   Liver Function Tests: Recent Labs  Lab 05/13/24 2155  AST 37  ALT 25  ALKPHOS 62  BILITOT 0.6  PROT 6.2*  ALBUMIN 2.9*   No results for input(s): LIPASE, AMYLASE in the last 168 hours. Recent Labs  Lab 05/19/24 0425  AMMONIA 34   Cardiac Enzymes: No results for input(s): CKTOTAL, CKMB, CKMBINDEX, TROPONINI in the last 168 hours. BNP (last 3 results) Recent Labs    05/13/24 2155  BNP 107.3*   CBG: No results for input(s): GLUCAP in the last 168 hours.  Time spent: 35 minutes  Signed:  Elvan Sor  [facility] 05/19/2024 12:57 PM

## 2024-05-22 ENCOUNTER — Other Ambulatory Visit: Payer: Self-pay

## 2024-05-26 ENCOUNTER — Other Ambulatory Visit: Payer: Self-pay
